# Patient Record
Sex: Female | Born: 1950 | Race: White | Hispanic: No | Marital: Married | Smoking: Former smoker
Health system: Southern US, Community
[De-identification: ages and names within clinical notes are randomized; demographics above are authoritative.]

## PROBLEM LIST (undated history)

## (undated) DIAGNOSIS — M810 Age-related osteoporosis without current pathological fracture: Secondary | ICD-10-CM

## (undated) DIAGNOSIS — R0600 Dyspnea, unspecified: Secondary | ICD-10-CM

## (undated) DIAGNOSIS — F32A Depression, unspecified: Secondary | ICD-10-CM

## (undated) DIAGNOSIS — R52 Pain, unspecified: Secondary | ICD-10-CM

## (undated) DIAGNOSIS — T4145XA Adverse effect of unspecified anesthetic, initial encounter: Secondary | ICD-10-CM

## (undated) DIAGNOSIS — R569 Unspecified convulsions: Secondary | ICD-10-CM

## (undated) DIAGNOSIS — G971 Other reaction to spinal and lumbar puncture: Secondary | ICD-10-CM

## (undated) DIAGNOSIS — J189 Pneumonia, unspecified organism: Secondary | ICD-10-CM

## (undated) DIAGNOSIS — M797 Fibromyalgia: Secondary | ICD-10-CM

## (undated) DIAGNOSIS — Z8489 Family history of other specified conditions: Secondary | ICD-10-CM

## (undated) DIAGNOSIS — T8859XA Other complications of anesthesia, initial encounter: Secondary | ICD-10-CM

## (undated) DIAGNOSIS — D649 Anemia, unspecified: Secondary | ICD-10-CM

## (undated) DIAGNOSIS — I2510 Atherosclerotic heart disease of native coronary artery without angina pectoris: Secondary | ICD-10-CM

## (undated) DIAGNOSIS — E039 Hypothyroidism, unspecified: Secondary | ICD-10-CM

## (undated) DIAGNOSIS — Z87442 Personal history of urinary calculi: Secondary | ICD-10-CM

## (undated) DIAGNOSIS — M1990 Unspecified osteoarthritis, unspecified site: Secondary | ICD-10-CM

## (undated) DIAGNOSIS — J45909 Unspecified asthma, uncomplicated: Secondary | ICD-10-CM

## (undated) DIAGNOSIS — F419 Anxiety disorder, unspecified: Secondary | ICD-10-CM

## (undated) DIAGNOSIS — J449 Chronic obstructive pulmonary disease, unspecified: Secondary | ICD-10-CM

## (undated) DIAGNOSIS — I639 Cerebral infarction, unspecified: Secondary | ICD-10-CM

## (undated) DIAGNOSIS — M3500 Sicca syndrome, unspecified: Secondary | ICD-10-CM

## (undated) DIAGNOSIS — I10 Essential (primary) hypertension: Secondary | ICD-10-CM

## (undated) DIAGNOSIS — I351 Nonrheumatic aortic (valve) insufficiency: Secondary | ICD-10-CM

## (undated) DIAGNOSIS — H269 Unspecified cataract: Secondary | ICD-10-CM

## (undated) DIAGNOSIS — F329 Major depressive disorder, single episode, unspecified: Secondary | ICD-10-CM

## (undated) DIAGNOSIS — Z5189 Encounter for other specified aftercare: Secondary | ICD-10-CM

## (undated) DIAGNOSIS — R112 Nausea with vomiting, unspecified: Secondary | ICD-10-CM

## (undated) DIAGNOSIS — I776 Arteritis, unspecified: Secondary | ICD-10-CM

## (undated) DIAGNOSIS — J42 Unspecified chronic bronchitis: Secondary | ICD-10-CM

## (undated) DIAGNOSIS — E559 Vitamin D deficiency, unspecified: Secondary | ICD-10-CM

## (undated) DIAGNOSIS — E538 Deficiency of other specified B group vitamins: Secondary | ICD-10-CM

## (undated) DIAGNOSIS — E785 Hyperlipidemia, unspecified: Secondary | ICD-10-CM

## (undated) DIAGNOSIS — M5030 Other cervical disc degeneration, unspecified cervical region: Secondary | ICD-10-CM

## (undated) DIAGNOSIS — Z9889 Other specified postprocedural states: Secondary | ICD-10-CM

## (undated) DIAGNOSIS — K219 Gastro-esophageal reflux disease without esophagitis: Secondary | ICD-10-CM

## (undated) HISTORY — PX: COLONOSCOPY: SHX174

## (undated) HISTORY — DX: Age-related osteoporosis without current pathological fracture: M81.0

## (undated) HISTORY — PX: STOMACH SURGERY: SHX791

## (undated) HISTORY — DX: Vitamin D deficiency, unspecified: E55.9

## (undated) HISTORY — DX: Anxiety disorder, unspecified: F41.9

## (undated) HISTORY — DX: Encounter for other specified aftercare: Z51.89

## (undated) HISTORY — PX: APPENDECTOMY: SHX54

## (undated) HISTORY — PX: OOPHORECTOMY: SHX86

## (undated) HISTORY — DX: Nonrheumatic aortic (valve) insufficiency: I35.1

## (undated) HISTORY — PX: ABDOMINAL HYSTERECTOMY: SHX81

## (undated) HISTORY — DX: Unspecified osteoarthritis, unspecified site: M19.90

## (undated) HISTORY — DX: Sjogren syndrome, unspecified: M35.00

## (undated) HISTORY — DX: Deficiency of other specified B group vitamins: E53.8

## (undated) HISTORY — PX: CYSTOSCOPY: SUR368

## (undated) HISTORY — DX: Cerebral infarction, unspecified: I63.9

## (undated) HISTORY — PX: ESOPHAGOGASTRODUODENOSCOPY: SHX1529

## (undated) HISTORY — DX: Unspecified cataract: H26.9

## (undated) HISTORY — PX: KNEE ARTHROSCOPY: SUR90

## (undated) HISTORY — PX: CHOLECYSTECTOMY: SHX55

---
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
## 1990-07-15 HISTORY — PX: BREAST LUMPECTOMY: SHX2

## 1990-07-15 HISTORY — PX: BREAST BIOPSY: SHX20

## 2004-05-03 ENCOUNTER — Ambulatory Visit: Payer: Self-pay | Admitting: Physician Assistant

## 2004-06-13 ENCOUNTER — Ambulatory Visit: Payer: Self-pay | Admitting: Physician Assistant

## 2004-07-10 ENCOUNTER — Ambulatory Visit: Payer: Self-pay | Admitting: Physician Assistant

## 2004-08-07 ENCOUNTER — Ambulatory Visit: Payer: Self-pay | Admitting: Physician Assistant

## 2004-09-06 ENCOUNTER — Ambulatory Visit: Payer: Self-pay | Admitting: Physician Assistant

## 2004-10-04 ENCOUNTER — Ambulatory Visit: Payer: Self-pay | Admitting: Physician Assistant

## 2004-11-06 ENCOUNTER — Ambulatory Visit: Payer: Self-pay | Admitting: Physician Assistant

## 2004-12-11 ENCOUNTER — Ambulatory Visit: Payer: Self-pay | Admitting: Physician Assistant

## 2004-12-26 ENCOUNTER — Ambulatory Visit: Payer: Self-pay | Admitting: Pain Medicine

## 2005-01-01 HISTORY — PX: BRAIN SURGERY: SHX531

## 2005-01-23 ENCOUNTER — Ambulatory Visit: Payer: Self-pay | Admitting: Physician Assistant

## 2005-02-25 ENCOUNTER — Ambulatory Visit: Payer: Self-pay | Admitting: Physician Assistant

## 2005-04-09 ENCOUNTER — Ambulatory Visit: Payer: Self-pay | Admitting: Physician Assistant

## 2005-05-09 ENCOUNTER — Ambulatory Visit: Payer: Self-pay | Admitting: Physician Assistant

## 2005-05-10 ENCOUNTER — Observation Stay: Payer: Self-pay | Admitting: Internal Medicine

## 2005-05-21 ENCOUNTER — Ambulatory Visit: Payer: Self-pay | Admitting: Oncology

## 2005-06-04 ENCOUNTER — Ambulatory Visit: Payer: Self-pay | Admitting: Physician Assistant

## 2005-06-14 ENCOUNTER — Ambulatory Visit: Payer: Self-pay | Admitting: Oncology

## 2005-07-02 ENCOUNTER — Ambulatory Visit: Payer: Self-pay | Admitting: Physician Assistant

## 2005-08-07 ENCOUNTER — Ambulatory Visit: Payer: Self-pay | Admitting: Physician Assistant

## 2005-09-03 ENCOUNTER — Ambulatory Visit: Payer: Self-pay | Admitting: Physician Assistant

## 2005-10-09 ENCOUNTER — Ambulatory Visit: Payer: Self-pay | Admitting: Physician Assistant

## 2005-11-01 ENCOUNTER — Ambulatory Visit: Payer: Self-pay | Admitting: Neurology

## 2005-11-04 ENCOUNTER — Ambulatory Visit: Payer: Self-pay | Admitting: Physician Assistant

## 2005-11-14 ENCOUNTER — Ambulatory Visit: Payer: Self-pay | Admitting: Internal Medicine

## 2005-12-12 ENCOUNTER — Ambulatory Visit: Payer: Self-pay | Admitting: Physician Assistant

## 2006-01-09 ENCOUNTER — Ambulatory Visit: Payer: Self-pay | Admitting: Physician Assistant

## 2006-01-23 ENCOUNTER — Ambulatory Visit: Payer: Self-pay | Admitting: Pain Medicine

## 2006-02-04 ENCOUNTER — Ambulatory Visit: Payer: Self-pay | Admitting: Pain Medicine

## 2006-02-07 ENCOUNTER — Ambulatory Visit: Payer: Self-pay | Admitting: Pain Medicine

## 2006-02-10 ENCOUNTER — Ambulatory Visit: Payer: Self-pay | Admitting: Pain Medicine

## 2006-02-25 ENCOUNTER — Ambulatory Visit: Payer: Self-pay | Admitting: Pain Medicine

## 2006-04-01 ENCOUNTER — Ambulatory Visit: Payer: Self-pay | Admitting: Physician Assistant

## 2006-04-23 ENCOUNTER — Encounter: Payer: Self-pay | Admitting: Pain Medicine

## 2006-05-08 ENCOUNTER — Ambulatory Visit: Payer: Self-pay | Admitting: Physician Assistant

## 2006-05-15 ENCOUNTER — Encounter: Payer: Self-pay | Admitting: Pain Medicine

## 2006-06-04 ENCOUNTER — Ambulatory Visit: Payer: Self-pay | Admitting: Physician Assistant

## 2006-06-30 ENCOUNTER — Ambulatory Visit: Payer: Self-pay | Admitting: Physician Assistant

## 2007-02-04 ENCOUNTER — Emergency Department: Payer: Self-pay | Admitting: Emergency Medicine

## 2007-06-23 ENCOUNTER — Ambulatory Visit: Payer: Self-pay | Admitting: Internal Medicine

## 2007-10-28 ENCOUNTER — Ambulatory Visit: Payer: Self-pay | Admitting: Internal Medicine

## 2007-11-26 ENCOUNTER — Ambulatory Visit: Payer: Self-pay | Admitting: Neurology

## 2008-11-01 ENCOUNTER — Ambulatory Visit: Payer: Self-pay | Admitting: Internal Medicine

## 2008-11-11 ENCOUNTER — Ambulatory Visit: Payer: Self-pay | Admitting: Internal Medicine

## 2009-12-01 ENCOUNTER — Emergency Department: Payer: Self-pay

## 2009-12-28 ENCOUNTER — Ambulatory Visit: Payer: Self-pay | Admitting: Internal Medicine

## 2010-11-28 ENCOUNTER — Ambulatory Visit: Payer: Self-pay | Admitting: Internal Medicine

## 2011-09-12 ENCOUNTER — Ambulatory Visit: Payer: Self-pay | Admitting: Physician Assistant

## 2011-12-16 ENCOUNTER — Emergency Department (HOSPITAL_COMMUNITY): Payer: Medicare Other

## 2011-12-16 ENCOUNTER — Encounter (HOSPITAL_COMMUNITY): Payer: Self-pay | Admitting: Emergency Medicine

## 2011-12-16 ENCOUNTER — Emergency Department (HOSPITAL_COMMUNITY)
Admission: EM | Admit: 2011-12-16 | Discharge: 2011-12-16 | Disposition: A | Discharge status: Home / Self Care | Payer: Medicare Other | Attending: Emergency Medicine | Admitting: Emergency Medicine

## 2011-12-16 DIAGNOSIS — R110 Nausea: Secondary | ICD-10-CM | POA: Insufficient documentation

## 2011-12-16 DIAGNOSIS — R197 Diarrhea, unspecified: Secondary | ICD-10-CM

## 2011-12-16 DIAGNOSIS — R109 Unspecified abdominal pain: Secondary | ICD-10-CM | POA: Insufficient documentation

## 2011-12-16 DIAGNOSIS — I10 Essential (primary) hypertension: Secondary | ICD-10-CM | POA: Insufficient documentation

## 2011-12-16 DIAGNOSIS — K219 Gastro-esophageal reflux disease without esophagitis: Secondary | ICD-10-CM | POA: Insufficient documentation

## 2011-12-16 HISTORY — DX: Fibromyalgia: M79.7

## 2011-12-16 HISTORY — DX: Essential (primary) hypertension: I10

## 2011-12-16 HISTORY — DX: Unspecified osteoarthritis, unspecified site: M19.90

## 2011-12-16 LAB — POCT I-STAT, CHEM 8
Calcium, Ion: 1.23 mmol/L (ref 1.12–1.32)
Creatinine, Ser: 1.2 mg/dL — ABNORMAL HIGH (ref 0.50–1.10)
Hemoglobin: 15.6 g/dL — ABNORMAL HIGH (ref 12.0–15.0)
Sodium: 144 mEq/L (ref 135–145)
TCO2: 21 mmol/L (ref 0–100)

## 2011-12-16 LAB — DIFFERENTIAL
Basophils Absolute: 0.1 10*3/uL (ref 0.0–0.1)
Eosinophils Absolute: 0.8 10*3/uL — ABNORMAL HIGH (ref 0.0–0.7)
Eosinophils Relative: 9 % — ABNORMAL HIGH (ref 0–5)

## 2011-12-16 LAB — CBC
MCH: 30.1 pg (ref 26.0–34.0)
MCV: 89.3 fL (ref 78.0–100.0)
Platelets: 280 10*3/uL (ref 150–400)
RDW: 13.6 % (ref 11.5–15.5)

## 2011-12-16 MED ORDER — SODIUM CHLORIDE 0.9 % IV BOLUS (SEPSIS): 500.0000 mL | Freq: Once | INTRAVENOUS | Status: DC

## 2011-12-16 MED ORDER — SODIUM CHLORIDE 0.9 % IV SOLN
Freq: Once | INTRAVENOUS | Status: AC
  Administered 2011-12-16: 19:00:00 via INTRAVENOUS

## 2011-12-16 MED ORDER — DIPHENOXYLATE-ATROPINE 2.5-0.025 MG PO TABS: 1.0000 | ORAL_TABLET | Freq: Four times a day (QID) | ORAL | Status: AC | PRN

## 2011-12-16 NOTE — ED Notes (Signed)
Pt is currently in radiology. 

## 2011-12-16 NOTE — ED Notes (Signed)
Pt tolerating Orange PO contrast at this time.   Pt not having any airway issues, pt maintaining saliva, pt with respirations even and unlabored.  Pt ambulated to and from RR with steady gait.

## 2011-12-16 NOTE — ED Notes (Signed)
Pt states she hasn't taken anything to eat today. Pt states she only drunk water.

## 2011-12-16 NOTE — ED Notes (Signed)
Pt states pain is at a 8 in her abdomen

## 2011-12-16 NOTE — Discharge Instructions (Signed)
Please follow up with your GI doctor on June 20th as scheduled.    Abdominal Pain Abdominal pain can be caused by many things. Your caregiver decides the seriousness of your pain by an examination and possibly blood tests and X-rays. Many cases can be observed and treated at home. Most abdominal pain is not caused by a disease and will probably improve without treatment. However, in many cases, more time must pass before a clear cause of the pain can be found. Before that point, it may not be known if you need more testing, or if hospitalization or surgery is needed. HOME CARE INSTRUCTIONS   Do not take laxatives unless directed by your caregiver.   Take pain medicine only as directed by your caregiver.   Only take over-the-counter or prescription medicines for pain, discomfort, or fever as directed by your caregiver.   Try a clear liquid diet (broth, tea, or water) for as long as directed by your caregiver. Slowly move to a bland diet as tolerated.  SEEK IMMEDIATE MEDICAL CARE IF:   The pain does not go away.   You have a fever.   You keep throwing up (vomiting).   The pain is felt only in portions of the abdomen. Pain in the right side could possibly be appendicitis. In an adult, pain in the left lower portion of the abdomen could be colitis or diverticulitis.   You pass bloody or black tarry stools.  MAKE SURE YOU:   Understand these instructions.   Will watch your condition.   Will get help right away if you are not doing well or get worse.  Document Released: 04/10/2005 Document Revised: 06/20/2011 Document Reviewed: 02/17/2008 [facility] Patient Information 2012 [place], [place].Diarrhea Infections caused by germs (bacterial) or a virus commonly cause diarrhea. Your caregiver has determined that with time, rest and fluids, the diarrhea should improve. In general, eat normally while drinking more water than usual. Although water may prevent dehydration, it does not contain salt and  minerals (electrolytes). Broths, weak tea without caffeine and oral rehydration solutions (ORS) replace fluids and electrolytes. Small amounts of fluids should be taken frequently. Large amounts at one time may not be tolerated. Plain water may be harmful in infants and the elderly. Oral rehydrating solutions (ORS) are available at pharmacies and grocery stores. ORS replace water and important electrolytes in proper proportions. Sports drinks are not as effective as ORS and may be harmful due to sugars worsening diarrhea.  ORS is especially recommended for use in children with diarrhea. As a general guideline for children, replace any new fluid losses from diarrhea and/or vomiting with ORS as follows:   If your child weighs 22 pounds or under (10 kg or less), give 60-120 mL ( -  cup or 2 - 4 ounces) of ORS for each episode of diarrheal stool or vomiting episode.   If your child weighs more than 22 pounds (more than 10 kgs), give 120-240 mL ( - 1 cup or 4 - 8 ounces) of ORS for each diarrheal stool or episode of vomiting.   While correcting for dehydration, children should eat normally. However, foods high in sugar should be avoided because this may worsen diarrhea. Large amounts of carbonated soft drinks, juice, gelatin desserts and other highly sugared drinks should be avoided.   After correction of dehydration, other liquids that are appealing to the child may be added. Children should drink small amounts of fluids frequently and fluids should be increased as tolerated. Children should drink  enough fluids to keep urine clear or pale yellow.   Adults should eat normally while drinking more fluids than usual. Drink small amounts of fluids frequently and increase as tolerated. Drink enough fluids to keep urine clear or pale yellow. Broths, weak decaffeinated tea, lemon lime soft drinks (allowed to go flat) and ORS replace fluids and electrolytes.   Avoid:   Carbonated drinks.   Juice.    Extremely hot or cold fluids.   Caffeine drinks.   Fatty, greasy foods.   Alcohol.   Tobacco.   Too much intake of anything at one time.   Gelatin desserts.   Probiotics are active cultures of beneficial bacteria. They may lessen the amount and number of diarrheal stools in adults. Probiotics can be found in yogurt with active cultures and in supplements.   Wash hands well to avoid spreading bacteria and virus.   Anti-diarrheal medications are not recommended for infants and children.   Only take over-the-counter or prescription medicines for pain, discomfort or fever as directed by your caregiver. Do not give aspirin to children because it may cause Reye's Syndrome.   For adults, ask your caregiver if you should continue all prescribed and over-the-counter medicines.   If your caregiver has given you a follow-up appointment, it is very important to keep that appointment. Not keeping the appointment could result in a chronic or permanent injury, and disability. If there is any problem keeping the appointment, you must call back to this facility for assistance.  SEEK IMMEDIATE MEDICAL CARE IF:   You or your child is unable to keep fluids down or other symptoms or problems become worse in spite of treatment.   Vomiting or diarrhea develops and becomes persistent.   There is vomiting of blood or bile (green material).   There is blood in the stool or the stools are black and tarry.   There is no urine output in 6-8 hours or there is only a small amount of very dark urine.   Abdominal pain develops, increases or localizes.   You have a fever.   Your baby is older than 3 months with a rectal temperature of 102 F (38.9 C) or higher.   Your baby is 71 months old or younger with a rectal temperature of 100.4 F (38 C) or higher.   You or your child develops excessive weakness, dizziness, fainting or extreme thirst.   You or your child develops a rash, stiff neck, severe  headache or become irritable or sleepy and difficult to awaken.  MAKE SURE YOU:   Understand these instructions.   Will watch your condition.   Will get help right away if you are not doing well or get worse.  Document Released: 06/21/2002 Document Revised: 06/20/2011 Document Reviewed: 05/08/2009 [facility] Patient Information 2012 [place], [place].

## 2011-12-16 NOTE — ED Provider Notes (Addendum)
History     CSN: [id number]  Arrival date & time 12/16/11  1411   First MD Initiated Contact with Patient 12/16/11 1735      Chief Complaint  Patient presents with  . Diarrhea    (Consider location/radiation/quality/duration/timing/severity/associated sxs/prior treatment) HPI Comments: Patient presents with a known history of loose stools that are changed since Friday.  Patient notes that since Friday she said more frequent loose stools approximately 3 times per day.  She's noted that they are coffee-ground looking stools.  No bright red blood.  Patient does note some increased abdominal pain that can be relieved by having a bowel movement.  She's had some nausea but no vomiting.  Patient did have a past history of a gastric ulcer which she was treated for.  Patient did note a fever on Friday night but no fever since that time.  She has had abdominal surgeries to include a cholecystectomy, an appendectomy and a total hysterectomy.  Patient is also on iron supplementation for a past history of anemia.  Patient is a 61 y.o. female presenting with diarrhea. The history is provided by the patient. No language interpreter was used.  Diarrhea The primary symptoms include abdominal pain, nausea and diarrhea. Primary symptoms do not include fever, vomiting, dysuria or rash.  The illness does not include chills or back pain.    Past Medical History  Diagnosis Date  . Hypertension   . Acid reflux   . Arthritis   . Fibromyalgia     Past Surgical History  Procedure Date  . Appendectomy   . Abdominal hysterectomy   . Cholecystectomy   . Brain surgery     History reviewed. No pertinent family history.  History  Substance Use Topics  . Smoking status: Never Smoker   . Smokeless tobacco: Not on file  . Alcohol Use: No    OB History    Grav Para Term Preterm Abortions TAB SAB Ect Mult Living                  Review of Systems  Constitutional: Negative.  Negative for fever and  chills.  HENT: Negative.   Eyes: Negative.  Negative for discharge and redness.  Respiratory: Negative.  Negative for cough and shortness of breath.   Cardiovascular: Negative.  Negative for chest pain.  Gastrointestinal: Positive for nausea, abdominal pain and diarrhea. Negative for vomiting.  Genitourinary: Negative.  Negative for dysuria and vaginal discharge.  Musculoskeletal: Negative.  Negative for back pain.  Skin: Negative.  Negative for color change and rash.  Neurological: Negative.  Negative for syncope and headaches.  Hematological: Negative.  Negative for adenopathy.  Psychiatric/Behavioral: Negative.  Negative for confusion.  All other systems reviewed and are negative.    Allergies  Other; Imitrex; Nsaids; and Tegretol  Home Medications   Current Outpatient Rx  Name Route Sig Dispense Refill  . ACAI PO Oral Take 1,000 mg by mouth daily.    . ASPIRIN EC 81 MG PO TBEC Oral Take 81 mg by mouth daily.    Marland Kitchen BIOTIN 5000 MCG PO CAPS Oral Take 1 capsule by mouth daily.    Marland Kitchen CITALOPRAM HYDROBROMIDE 40 MG PO TABS Oral Take 40 mg by mouth every evening.    Marland Kitchen VITAMIN B-12 PO Oral Take 2,500 mg by mouth daily.    Marland Kitchen BENADRYL PO Oral Take 50 mg by mouth as needed. For itching    . LOMOTIL PO Oral Take 1 tablet by mouth as needed.  For diarrhea    . FLAX SEED OIL 1000 MG PO CAPS Oral Take 1 capsule by mouth daily.    Marland Kitchen FOLIC ACID 1 MG PO TABS Oral Take 1 mg by mouth every morning.    Marland Kitchen HYDROCODONE-ACETAMINOPHEN 10-325 MG PO TABS Oral Take 1 tablet by mouth daily as needed. For pain    . IBUPROFEN 800 MG PO TABS Oral Take 800 mg by mouth 2 (two) times daily.    . IRON PO Oral Take 325 mg by mouth every morning.    . ACIDOPHILUS PO Oral Take 4 mg by mouth every morning.    Marland Kitchen LANSOPRAZOLE 30 MG PO CPDR Oral Take 30 mg by mouth 2 (two) times daily.    Marland Kitchen LISINOPRIL 40 MG PO TABS Oral Take 40 mg by mouth every evening.    Marland Kitchen LORAZEPAM 2 MG PO TABS Oral Take 2 mg by mouth at bedtime.      Marland Kitchen OVER THE COUNTER MEDICATION Oral Take 120 mg by mouth every morning. Gingko Biloba    . OVER THE COUNTER MEDICATION Oral Take 400 mg by mouth every morning. Ubiquinol Co Q 10    . OVER THE COUNTER MEDICATION Oral Take 40,000 Units by mouth every evening. serrapeptase    . VITAMIN B-2 PO Oral Take 1 tablet by mouth daily.    Marland Kitchen SIMVASTATIN 40 MG PO TABS Oral Take 40 mg by mouth every evening.    . TOPIRAMATE 200 MG PO TABS Oral Take 200 mg by mouth 2 (two) times daily.    Marland Kitchen VITAMIN D (ERGOCALCIFEROL) 50000 UNITS PO CAPS Oral Take 50,000 Units by mouth every 7 (seven) days. saturdays      BP 130/77  Pulse 69  Temp(Src) 98 F (36.7 C) (Oral)  Resp 16  SpO2 99%  Physical Exam  Nursing note and vitals reviewed. Constitutional: She is oriented to person, place, and time. She appears well-developed and well-nourished.  Non-toxic appearance. She does not have a sickly appearance.       Obese female sitting on the bed  HENT:  Head: Normocephalic and atraumatic.  Eyes: Conjunctivae, EOM and lids are normal. Pupils are equal, round, and reactive to light. No scleral icterus.  Neck: Trachea normal and normal range of motion. Neck supple.  Cardiovascular: Normal rate, regular rhythm and normal heart sounds.   Pulmonary/Chest: Effort normal and breath sounds normal.  Abdominal: Soft. Normal appearance. There is tenderness. There is no rebound, no guarding and no CVA tenderness.       No rebound or guarding but there is lower abdominal tenderness worse in the left lower quadrant.  Genitourinary:       No grossly bloody or melanic stool present.  Hemoccult negative.  Musculoskeletal: Normal range of motion.  Neurological: She is alert and oriented to person, place, and time. She has normal strength.  Skin: Skin is warm, dry and intact. No rash noted.  Psychiatric: She has a normal mood and affect. Her behavior is normal. Judgment and thought content normal.    ED Course  Procedures (including  critical care time)  Results for orders placed during the hospital encounter of 12/16/11  CBC      Component Value Range   WBC 8.5  4.0 - 10.5 (K/uL)   RBC 4.75  3.87 - 5.11 (MIL/uL)   Hemoglobin 14.3  12.0 - 15.0 (g/dL)   HCT 98.1  19.1 - 47.8 (%)   MCV 89.3  78.0 - 100.0 (fL)  MCH 30.1  26.0 - 34.0 (pg)   MCHC 33.7  30.0 - 36.0 (g/dL)   RDW 16.1  09.6 - 04.5 (%)   Platelets 280  150 - 400 (K/uL)  DIFFERENTIAL      Component Value Range   Neutrophils Relative 64  43 - 77 (%)   Neutro Abs 5.4  1.7 - 7.7 (K/uL)   Lymphocytes Relative 21  12 - 46 (%)   Lymphs Abs 1.8  0.7 - 4.0 (K/uL)   Monocytes Relative 6  3 - 12 (%)   Monocytes Absolute 0.5  0.1 - 1.0 (K/uL)   Eosinophils Relative 9 (*) 0 - 5 (%)   Eosinophils Absolute 0.8 (*) 0.0 - 0.7 (K/uL)   Basophils Relative 1  0 - 1 (%)   Basophils Absolute 0.1  0.0 - 0.1 (K/uL)  POCT I-STAT, CHEM 8      Component Value Range   Sodium 144  135 - 145 (mEq/L)   Potassium 3.6  3.5 - 5.1 (mEq/L)   Chloride 112  96 - 112 (mEq/L)   BUN 16  6 - 23 (mg/dL)   Creatinine, Ser 4.09 (*) 0.50 - 1.10 (mg/dL)   Glucose, Bld 92  70 - 99 (mg/dL)   Calcium, Ion 8.11  9.14 - 1.32 (mmol/L)   TCO2 21  0 - 100 (mmol/L)   Hemoglobin 15.6 (*) 12.0 - 15.0 (g/dL)   HCT 78.2  95.6 - 21.3 (%)        MDM  Patient with somewhat diffuse abdominal pain but appears to be worse in the left lower quadrant.  Patient describes some stools that may be concerning for bleeding but her Hemoccult negative here.  There is no bright red blood present.  Given patient's pain and history of fevers on Friday night to do have some concern for possible diverticulitis or other colitis process.  Patient will have a CT scan to assess for these processes.    Nat Christen, MD 12/16/11 1834  Patient's CT does not show any acute pathology such as colitis or diverticulitis at this time.  Patient's hemoglobin is stable.  She had no signs of GI bleeding here in the emergency  department.  Patient's vital signs have remained normal and she is afebrile.  Patient already has scheduled GI followup on June 20.  Believe patient is safe for discharge home as her diarrhea has been ongoing for many months to years and is not new and acute in the overall picture.  Nat Christen, MD 12/16/11 [phone]

## 2011-12-16 NOTE — ED Notes (Signed)
Pt states she is allergic to iodine/ consulted with Dr, Golda Acre who changed order to ct of abdomen w/o contrast

## 2011-12-16 NOTE — ED Notes (Signed)
Pt is currently back in her room 

## 2011-12-16 NOTE — ED Notes (Signed)
Pt states that the berry flavored PO contrast was making her lips tingle/ called ct and they offered her orange cream or apple flavored and patient chosed the orange cream

## 2011-12-16 NOTE — ED Notes (Signed)
Pt refusing to continue taking orange flavored contrast stating that it is causing her to have more diarrhea. Consulted with Dr. Golda Acre verbally agree that she did not have to continue the contrast. Called CT to let them know they can come take her to radiology.

## 2011-12-16 NOTE — ED Notes (Signed)
MD at bedside. 

## 2011-12-16 NOTE — ED Notes (Signed)
Onset 3 days ago diarrhea 3 episodes a day. States stool looks like coffee grounds soft stool.  States history if diarrhea however past few days ago worsening. General abdominal pain currently 8/10 achy sharp.

## 2011-12-18 LAB — OVA AND PARASITE EXAMINATION: Ova and parasites: NONE SEEN

## 2011-12-20 LAB — STOOL CULTURE

## 2012-02-05 ENCOUNTER — Ambulatory Visit: Payer: Self-pay | Admitting: Gastroenterology

## 2012-02-07 LAB — PATHOLOGY REPORT

## 2012-03-27 ENCOUNTER — Ambulatory Visit: Payer: Self-pay | Admitting: Neurology

## 2012-11-10 ENCOUNTER — Ambulatory Visit: Payer: Self-pay | Admitting: Internal Medicine

## 2012-12-01 ENCOUNTER — Emergency Department: Payer: Self-pay | Admitting: Emergency Medicine

## 2012-12-01 LAB — CBC
HCT: 40.4 % (ref 35.0–47.0)
HGB: 13.4 g/dL (ref 12.0–16.0)
MCH: 28.3 pg (ref 26.0–34.0)
MCHC: 33 g/dL (ref 32.0–36.0)
Platelet: 379 10*3/uL (ref 150–440)
RBC: 4.73 10*6/uL (ref 3.80–5.20)
WBC: 11.4 10*3/uL — ABNORMAL HIGH (ref 3.6–11.0)

## 2012-12-01 LAB — COMPREHENSIVE METABOLIC PANEL
Bilirubin,Total: 0.3 mg/dL (ref 0.2–1.0)
Chloride: 114 mmol/L — ABNORMAL HIGH (ref 98–107)
EGFR (African American): 54 — ABNORMAL LOW
EGFR (Non-African Amer.): 47 — ABNORMAL LOW
SGPT (ALT): 16 U/L (ref 12–78)
Sodium: 145 mmol/L (ref 136–145)
Total Protein: 8.2 g/dL (ref 6.4–8.2)

## 2012-12-01 LAB — URINALYSIS, COMPLETE
Bilirubin,UR: NEGATIVE
Blood: NEGATIVE
Hyaline Cast: 3
Protein: 30
RBC,UR: 2 /HPF (ref 0–5)
Squamous Epithelial: 4
WBC UR: 3 /HPF (ref 0–5)

## 2012-12-18 ENCOUNTER — Ambulatory Visit: Payer: Self-pay | Admitting: Internal Medicine

## 2012-12-28 ENCOUNTER — Ambulatory Visit: Payer: Self-pay | Admitting: Gastroenterology

## 2013-11-25 ENCOUNTER — Ambulatory Visit: Payer: Self-pay | Admitting: Internal Medicine

## 2014-03-23 ENCOUNTER — Ambulatory Visit: Payer: Self-pay | Admitting: Internal Medicine

## 2014-06-15 ENCOUNTER — Emergency Department: Payer: Self-pay | Admitting: Emergency Medicine

## 2014-07-20 ENCOUNTER — Ambulatory Visit: Payer: Self-pay | Admitting: Oncology

## 2014-07-20 LAB — CBC CANCER CENTER
BASOS ABS: 0.1 x10 3/mm (ref 0.0–0.1)
Basophil %: 1 %
EOS ABS: 0.4 x10 3/mm (ref 0.0–0.7)
Eosinophil %: 6.6 %
HCT: 39.1 % (ref 35.0–47.0)
HGB: 12.9 g/dL (ref 12.0–16.0)
LYMPHS ABS: 1.2 x10 3/mm (ref 1.0–3.6)
Lymphocyte %: 18.3 %
MCH: 30.4 pg (ref 26.0–34.0)
MCHC: 33 g/dL (ref 32.0–36.0)
MCV: 92 fL (ref 80–100)
MONO ABS: 0.5 x10 3/mm (ref 0.2–0.9)
MONOS PCT: 8.1 %
NEUTROS ABS: 4.2 x10 3/mm (ref 1.4–6.5)
Neutrophil %: 66 %
Platelet: 223 x10 3/mm (ref 150–440)
RBC: 4.25 10*6/uL (ref 3.80–5.20)
RDW: 13 % (ref 11.5–14.5)
WBC: 6.3 x10 3/mm (ref 3.6–11.0)

## 2014-07-20 LAB — COMPREHENSIVE METABOLIC PANEL
ALBUMIN: 3.5 g/dL (ref 3.4–5.0)
ALK PHOS: 95 U/L
ALT: 18 U/L
Anion Gap: 11 (ref 7–16)
BILIRUBIN TOTAL: 0.3 mg/dL (ref 0.2–1.0)
BUN: 11 mg/dL (ref 7–18)
Calcium, Total: 8.2 mg/dL — ABNORMAL LOW (ref 8.5–10.1)
Chloride: 107 mmol/L (ref 98–107)
Co2: 25 mmol/L (ref 21–32)
Creatinine: 1.05 mg/dL (ref 0.60–1.30)
EGFR (African American): >60
GFR CALC NON AF AMER: 56 — AB
GLUCOSE: 91 mg/dL (ref 65–99)
Osmolality: 284 (ref 275–301)
Potassium: 3.1 mmol/L — ABNORMAL LOW (ref 3.5–5.1)
SGOT(AST): 16 U/L (ref 15–37)
SODIUM: 143 mmol/L (ref 136–145)
TOTAL PROTEIN: 7 g/dL (ref 6.4–8.2)

## 2014-07-21 LAB — KAPPA/LAMBDA FREE LIGHT CHAINS (ARMC)

## 2014-07-21 LAB — PROT IMMUNOELECTROPHORES(ARMC)

## 2014-07-28 ENCOUNTER — Emergency Department: Payer: Self-pay | Admitting: Emergency Medicine

## 2014-07-28 LAB — COMPREHENSIVE METABOLIC PANEL
ALT: 22 U/L
AST: 24 U/L (ref 15–37)
Albumin: 3.5 g/dL (ref 3.4–5.0)
Alkaline Phosphatase: 102 U/L
Anion Gap: 8 (ref 7–16)
BUN: 13 mg/dL (ref 7–18)
Bilirubin,Total: 0.3 mg/dL (ref 0.2–1.0)
Calcium, Total: 8.6 mg/dL (ref 8.5–10.1)
Chloride: 108 mmol/L — ABNORMAL HIGH (ref 98–107)
Co2: 26 mmol/L (ref 21–32)
Creatinine: 1.04 mg/dL (ref 0.60–1.30)
EGFR (African American): >60
EGFR (Non-African Amer.): 57 — ABNORMAL LOW
GLUCOSE: 90 mg/dL (ref 65–99)
Osmolality: 283 (ref 275–301)
Potassium: 3.4 mmol/L — ABNORMAL LOW (ref 3.5–5.1)
Sodium: 142 mmol/L (ref 136–145)
Total Protein: 7.3 g/dL (ref 6.4–8.2)

## 2014-07-28 LAB — CBC
HCT: 40.3 % (ref 35.0–47.0)
HGB: 13 g/dL (ref 12.0–16.0)
MCH: 30.4 pg (ref 26.0–34.0)
MCHC: 32.2 g/dL (ref 32.0–36.0)
MCV: 94 fL (ref 80–100)
PLATELETS: 232 10*3/uL (ref 150–440)
RBC: 4.27 10*6/uL (ref 3.80–5.20)
RDW: 13.2 % (ref 11.5–14.5)
WBC: 7.6 10*3/uL (ref 3.6–11.0)

## 2014-07-28 LAB — TROPONIN I: Troponin-I: 0.03 ng/mL

## 2014-08-15 ENCOUNTER — Ambulatory Visit: Payer: Self-pay | Admitting: Oncology

## 2014-09-01 ENCOUNTER — Emergency Department: Payer: Self-pay | Admitting: Emergency Medicine

## 2014-09-13 ENCOUNTER — Ambulatory Visit: Payer: Self-pay | Admitting: Internal Medicine

## 2014-11-22 ENCOUNTER — Other Ambulatory Visit: Payer: Self-pay | Admitting: Internal Medicine

## 2014-11-22 DIAGNOSIS — Z1231 Encounter for screening mammogram for malignant neoplasm of breast: Secondary | ICD-10-CM

## 2014-12-08 ENCOUNTER — Other Ambulatory Visit: Payer: Self-pay | Admitting: Internal Medicine

## 2014-12-08 DIAGNOSIS — D329 Benign neoplasm of meninges, unspecified: Secondary | ICD-10-CM

## 2014-12-14 ENCOUNTER — Other Ambulatory Visit: Payer: Self-pay | Admitting: Internal Medicine

## 2014-12-14 ENCOUNTER — Ambulatory Visit
Admission: RE | Admit: 2014-12-14 | Discharge: 2014-12-14 | Disposition: A | Discharge status: Home / Self Care | Payer: Medicare Other | Source: Ambulatory Visit | Attending: Internal Medicine | Admitting: Internal Medicine

## 2014-12-14 DIAGNOSIS — Z1231 Encounter for screening mammogram for malignant neoplasm of breast: Secondary | ICD-10-CM | POA: Insufficient documentation

## 2014-12-16 ENCOUNTER — Ambulatory Visit: Admission: RE | Admit: 2014-12-16 | Payer: Medicare Other | Source: Ambulatory Visit

## 2014-12-16 ENCOUNTER — Ambulatory Visit
Admission: RE | Admit: 2014-12-16 | Discharge: 2014-12-16 | Disposition: A | Discharge status: Home / Self Care | Payer: Medicare Other | Source: Ambulatory Visit | Attending: Internal Medicine | Admitting: Internal Medicine

## 2014-12-16 DIAGNOSIS — Z08 Encounter for follow-up examination after completed treatment for malignant neoplasm: Secondary | ICD-10-CM | POA: Insufficient documentation

## 2014-12-16 DIAGNOSIS — D329 Benign neoplasm of meninges, unspecified: Secondary | ICD-10-CM | POA: Diagnosis present

## 2014-12-16 DIAGNOSIS — I679 Cerebrovascular disease, unspecified: Secondary | ICD-10-CM | POA: Insufficient documentation

## 2014-12-16 DIAGNOSIS — G930 Cerebral cysts: Secondary | ICD-10-CM | POA: Insufficient documentation

## 2014-12-16 DIAGNOSIS — Z9889 Other specified postprocedural states: Secondary | ICD-10-CM | POA: Diagnosis present

## 2014-12-28 ENCOUNTER — Other Ambulatory Visit: Payer: Self-pay | Admitting: Unknown Physician Specialty

## 2014-12-28 ENCOUNTER — Ambulatory Visit: Payer: Medicare Other | Admitting: Pain Medicine

## 2014-12-28 DIAGNOSIS — M170 Bilateral primary osteoarthritis of knee: Secondary | ICD-10-CM

## 2015-01-05 ENCOUNTER — Ambulatory Visit
Admission: RE | Admit: 2015-01-05 | Discharge: 2015-01-05 | Disposition: A | Discharge status: Home / Self Care | Payer: Medicare Other | Source: Ambulatory Visit | Attending: Unknown Physician Specialty | Admitting: Unknown Physician Specialty

## 2015-01-05 ENCOUNTER — Ambulatory Visit: Admission: RE | Admit: 2015-01-05 | Payer: Medicare Other | Source: Ambulatory Visit

## 2015-01-05 DIAGNOSIS — M2342 Loose body in knee, left knee: Secondary | ICD-10-CM | POA: Insufficient documentation

## 2015-01-05 DIAGNOSIS — M25562 Pain in left knee: Secondary | ICD-10-CM | POA: Diagnosis present

## 2015-01-05 DIAGNOSIS — S83242A Other tear of medial meniscus, current injury, left knee, initial encounter: Secondary | ICD-10-CM | POA: Diagnosis not present

## 2015-01-05 DIAGNOSIS — S83281A Other tear of lateral meniscus, current injury, right knee, initial encounter: Secondary | ICD-10-CM | POA: Insufficient documentation

## 2015-01-05 DIAGNOSIS — M1712 Unilateral primary osteoarthritis, left knee: Secondary | ICD-10-CM | POA: Insufficient documentation

## 2015-01-05 DIAGNOSIS — M170 Bilateral primary osteoarthritis of knee: Secondary | ICD-10-CM

## 2015-01-05 DIAGNOSIS — M179 Osteoarthritis of knee, unspecified: Secondary | ICD-10-CM | POA: Diagnosis present

## 2015-01-05 DIAGNOSIS — M25561 Pain in right knee: Secondary | ICD-10-CM | POA: Diagnosis present

## 2015-01-05 DIAGNOSIS — M1711 Unilateral primary osteoarthritis, right knee: Secondary | ICD-10-CM | POA: Diagnosis not present

## 2015-01-15 ENCOUNTER — Other Ambulatory Visit: Payer: Self-pay | Admitting: Pain Medicine

## 2015-01-17 ENCOUNTER — Other Ambulatory Visit: Payer: Self-pay | Admitting: Pain Medicine

## 2015-01-17 ENCOUNTER — Ambulatory Visit: Payer: Medicare Other | Attending: Pain Medicine | Admitting: Pain Medicine

## 2015-01-17 ENCOUNTER — Encounter: Payer: Self-pay | Admitting: Pain Medicine

## 2015-01-17 VITALS — BP 131/74 | HR 72 | Temp 97.5°F | Resp 18 | Ht 63.0 in | Wt 233.0 lb

## 2015-01-17 DIAGNOSIS — M4806 Spinal stenosis, lumbar region: Secondary | ICD-10-CM | POA: Insufficient documentation

## 2015-01-17 DIAGNOSIS — M533 Sacrococcygeal disorders, not elsewhere classified: Secondary | ICD-10-CM | POA: Diagnosis not present

## 2015-01-17 DIAGNOSIS — M4186 Other forms of scoliosis, lumbar region: Secondary | ICD-10-CM | POA: Insufficient documentation

## 2015-01-17 DIAGNOSIS — M51369 Other intervertebral disc degeneration, lumbar region without mention of lumbar back pain or lower extremity pain: Secondary | ICD-10-CM

## 2015-01-17 DIAGNOSIS — G43909 Migraine, unspecified, not intractable, without status migrainosus: Secondary | ICD-10-CM | POA: Insufficient documentation

## 2015-01-17 DIAGNOSIS — M545 Low back pain: Secondary | ICD-10-CM | POA: Diagnosis present

## 2015-01-17 DIAGNOSIS — M5134 Other intervertebral disc degeneration, thoracic region: Secondary | ICD-10-CM

## 2015-01-17 DIAGNOSIS — M5136 Other intervertebral disc degeneration, lumbar region: Secondary | ICD-10-CM | POA: Diagnosis not present

## 2015-01-17 DIAGNOSIS — G43109 Migraine with aura, not intractable, without status migrainosus: Secondary | ICD-10-CM

## 2015-01-17 DIAGNOSIS — M546 Pain in thoracic spine: Secondary | ICD-10-CM | POA: Diagnosis present

## 2015-01-17 DIAGNOSIS — M47816 Spondylosis without myelopathy or radiculopathy, lumbar region: Secondary | ICD-10-CM

## 2015-01-17 DIAGNOSIS — M47814 Spondylosis without myelopathy or radiculopathy, thoracic region: Secondary | ICD-10-CM | POA: Insufficient documentation

## 2015-01-17 DIAGNOSIS — M47894 Other spondylosis, thoracic region: Secondary | ICD-10-CM

## 2015-01-17 DIAGNOSIS — M5481 Occipital neuralgia: Secondary | ICD-10-CM | POA: Insufficient documentation
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
## 2015-01-17 NOTE — Progress Notes (Signed)
Safety precautions to be maintained throughout the outpatient stay will include: orient to surroundings, keep bed in low position, maintain call bell within reach at all times, provide assistance with transfer out of bed and ambulation.  

## 2015-01-17 NOTE — Patient Instructions (Addendum)
Continue present medications. We will  need permission from your  prescribing physician to prescribe medications for treatment of your pain  Lumbar facet, medial branch nerve, blocks to be performed at time return appointment Wednesday, 02/01/2015. We will need to ask Dr. Doy Hutching permission to hold your aspirin for 5 days prior to the procedure   F/U PCP for evaliation of  BP and general medical  condition.  F/U surgical evaluation  F/U neurological evaluation  May consider radiofrequency rhizolysis or intraspinal procedures pending response to present treatment and F/U evaluation.  Patient to call [facility] should patient have concerns prior to scheduled return appointment.              GENERAL RISKS AND COMPLICATIONS  What are the risk, side effects and possible complications? Generally speaking, most procedures are safe.  However, with any procedure there are risks, side effects, and the possibility of complications.  The risks and complications are dependent upon the sites that are lesioned, or the type of nerve block to be performed.  The closer the procedure is to the spine, the more serious the risks are.  Great care is taken when placing the radio frequency needles, block needles or lesioning probes, but sometimes complications can occur. 1. Infection: Any time there is an injection through the skin, there is a risk of infection.  This is why sterile conditions are used for these blocks.  There are four possible types of infection. 1. Localized skin infection. 2. Central Nervous System Infection-This can be in the form of Meningitis, which can be deadly. 3. Epidural Infections-This can be in the form of an epidural abscess, which can cause pressure inside of the spine, causing compression of the spinal cord with subsequent paralysis. This would require an emergency surgery to decompress, and there are no guarantees that the patient would recover from the  paralysis. 4. Discitis-This is an infection of the intervertebral discs.  It occurs in about 1% of discography procedures.  It is difficult to treat and it may lead to surgery.        2. Pain: the needles have to go through skin and soft tissues, will cause soreness.       3. Damage to internal structures:  The nerves to be lesioned may be near blood vessels or    other nerves which can be potentially damaged.       4. Bleeding: Bleeding is more common if the patient is taking blood thinners such as  aspirin, Coumadin, Ticiid, Plavix, etc., or if he/she have some genetic predisposition  such as hemophilia. Bleeding into the spinal canal can cause compression of the spinal  cord with subsequent paralysis.  This would require an emergency surgery to  decompress and there are no guarantees that the patient would recover from the  paralysis.       5. Pneumothorax:  Puncturing of a lung is a possibility, every time a needle is introduced in  the area of the chest or upper back.  Pneumothorax refers to free air around the  collapsed lung(s), inside of the thoracic cavity (chest cavity).  Another two possible  complications related to a similar event would include: Hemothorax and Chylothorax.   These are variations of the Pneumothorax, where instead of air around the collapsed  lung(s), you may have blood or chyle, respectively.       6. Spinal headaches: They may occur with any procedures in the area of the spine.  7. Persistent CSF (Cerebro-Spinal Fluid) leakage: This is a rare problem, but may occur  with prolonged intrathecal or epidural catheters either due to the formation of a fistulous  track or a dural tear.       8. Nerve damage: By working so close to the spinal cord, there is always a possibility of  nerve damage, which could be as serious as a permanent spinal cord injury with  paralysis.       9. Death:  Although rare, severe deadly allergic reactions known as "Anaphylactic  reaction" can  occur to any of the medications used.      10. Worsening of the symptoms:  We can always make thing worse.  What are the chances of something like this happening? Chances of any of this occuring are extremely low.  By statistics, you have more of a chance of getting killed in a motor vehicle accident: while driving to the hospital than any of the above occurring .  Nevertheless, you should be aware that they are possibilities.  In general, it is similar to taking a shower.  Everybody knows that you can slip, hit your head and get killed.  Does that mean that you should not shower again?  Nevertheless always keep in mind that statistics do not mean anything if you happen to be on the wrong side of them.  Even if a procedure has a 1 (one) in a 1,000,000 (million) chance of going wrong, it you happen to be that one..Also, keep in mind that by statistics, you have more of a chance of having something go wrong when taking medications.  Who should not have this procedure? If you are on a blood thinning medication (e.g. Coumadin, Plavix, see list of "Blood Thinners"), or if you have an active infection going on, you should not have the procedure.  If you are taking any blood thinners, please inform your physician.  How should I prepare for this procedure?  Do not eat or drink anything at least six hours prior to the procedure.  Bring a driver with you .  It cannot be a taxi.  Come accompanied by an adult that can drive you back, and that is strong enough to help you if your legs get weak or numb from the local anesthetic.  Take all of your medicines the morning of the procedure with just enough water to swallow them.  If you have diabetes, make sure that you are scheduled to have your procedure done first thing in the morning, whenever possible.  If you have diabetes, take only half of your insulin dose and notify our nurse that you have done so as soon as you arrive at the clinic.  If you are  diabetic, but only take blood sugar pills (oral hypoglycemic), then do not take them on the morning of your procedure.  You may take them after you have had the procedure.  Do not take aspirin or any aspirin-containing medications, at least eleven (11) days prior to the procedure.  They may prolong bleeding.  Wear loose fitting clothing that may be easy to take off and that you would not mind if it got stained with Betadine or blood.  Do not wear any jewelry or perfume  Remove any nail coloring.  It will interfere with some of our monitoring equipment.  NOTE: Remember that this is not meant to be interpreted as a complete list of all possible complications.  Unforeseen problems may occur.  BLOOD THINNERS  The following drugs contain aspirin or other products, which can cause increased bleeding during surgery and should not be taken for 2 weeks prior to and 1 week after surgery.  If you should need take something for relief of minor pain, you may take acetaminophen which is found in Tylenol,m Datril, Anacin-3 and Panadol. It is not blood thinner. The products listed below are.  Do not take any of the products listed below in addition to any listed on your instruction sheet.  A.P.C or A.P.C with Codeine Codeine Phosphate Capsules #3 Ibuprofen Ridaura  ABC compound Congesprin Imuran rimadil  Advil Cope Indocin Robaxisal  Alka-Seltzer Effervescent Pain Reliever and Antacid Coricidin or Coricidin-D  Indomethacin Rufen  Alka-Seltzer plus Cold Medicine Cosprin Ketoprofen S-A-C Tablets  Anacin Analgesic Tablets or Capsules Coumadin Korlgesic Salflex  Anacin Extra Strength Analgesic tablets or capsules CP-2 Tablets Lanoril Salicylate  Anaprox Cuprimine Capsules Levenox Salocol  Anexsia-D Dalteparin Magan Salsalate  Anodynos Darvon compound Magnesium Salicylate Sine-off  Ansaid Dasin Capsules Magsal Sodium Salicylate  Anturane Depen Capsules Marnal Soma  APF Arthritis pain formula Dewitt's Pills  Measurin Stanback  Argesic Dia-Gesic Meclofenamic Sulfinpyrazone  Arthritis Bayer Timed Release Aspirin Diclofenac Meclomen Sulindac  Arthritis pain formula Anacin Dicumarol Medipren Supac  Analgesic (Safety coated) Arthralgen Diffunasal Mefanamic Suprofen  Arthritis Strength Bufferin Dihydrocodeine Mepro Compound Suprol  Arthropan liquid Dopirydamole Methcarbomol with Aspirin Synalgos  ASA tablets/Enseals Disalcid Micrainin Tagament  Ascriptin Doan's Midol Talwin  Ascriptin A/D Dolene Mobidin Tanderil  Ascriptin Extra Strength Dolobid Moblgesic Ticlid  Ascriptin with Codeine Doloprin or Doloprin with Codeine Momentum Tolectin  Asperbuf Duoprin Mono-gesic Trendar  Aspergum Duradyne Motrin or Motrin IB Triminicin  Aspirin plain, buffered or enteric coated Durasal Myochrisine Trigesic  Aspirin Suppositories Easprin Nalfon Trillsate  Aspirin with Codeine Ecotrin Regular or Extra Strength Naprosyn Uracel  Atromid-S Efficin Naproxen Ursinus  Auranofin Capsules Elmiron Neocylate Vanquish  Axotal Emagrin Norgesic Verin  Azathioprine Empirin or Empirin with Codeine Normiflo Vitamin E  Azolid Emprazil Nuprin Voltaren  Bayer Aspirin plain, buffered or children's or timed BC Tablets or powders Encaprin Orgaran Warfarin Sodium  Buff-a-Comp Enoxaparin Orudis Zorpin  Buff-a-Comp with Codeine Equegesic Os-Cal-Gesic   Buffaprin Excedrin plain, buffered or Extra Strength Oxalid   Bufferin Arthritis Strength Feldene Oxphenbutazone   Bufferin plain or Extra Strength Feldene Capsules Oxycodone with Aspirin   Bufferin with Codeine Fenoprofen Fenoprofen Pabalate or Pabalate-SF   Buffets II Flogesic Panagesic   Buffinol plain or Extra Strength Florinal or Florinal with Codeine Panwarfarin   Buf-Tabs Flurbiprofen Penicillamine   Butalbital Compound Four-way cold tablets Penicillin   Butazolidin Fragmin Pepto-Bismol   Carbenicillin Geminisyn Percodan   Carna Arthritis Reliever Geopen Persantine    Carprofen Gold's salt Persistin   Chloramphenicol Goody's Phenylbutazone   Chloromycetin Haltrain Piroxlcam   Clmetidine heparin Plaquenil   Cllnoril Hyco-pap Ponstel   Clofibrate Hydroxy chloroquine Propoxyphen         Before stopping any of these medications, be sure to consult the physician who ordered them.  Some, such as Coumadin (Warfarin) are ordered to prevent or treat serious conditions such as "deep thrombosis", "pumonary embolisms", and other heart problems.  The amount of time that you may need off of the medication may also vary with the medication and the reason for which you were taking it.  If you are taking any of these medications, please make sure you notify your pain physician before you undergo any procedures.         Facet Blocks Patient  Information  Description: The facets are joints in the spine between the vertebrae.  Like any joints in the body, facets can become irritated and painful.  Arthritis can also effect the facets.  By injecting steroids and local anesthetic in and around these joints, we can temporarily block the nerve supply to them.  Steroids act directly on irritated nerves and tissues to reduce selling and inflammation which often leads to decreased pain.  Facet blocks may be done anywhere along the spine from the neck to the low back depending upon the location of your pain.   After numbing the skin with local anesthetic (like Novocaine), a small needle is passed onto the facet joints under x-ray guidance.  You may experience a sensation of pressure while this is being done.  The entire block usually lasts about 15-25 minutes.   Conditions which may be treated by facet blocks:   Low back/buttock pain  Neck/shoulder pain  Certain types of headaches  Preparation for the injection:  1. Do not eat any solid food or dairy products within 6 hours of your appointment. 2. You may drink clear liquid up to 2 hours before appointment.  Clear liquids  include water, black coffee, juice or soda.  No milk or cream please. 3. You may take your regular medication, including pain medications, with a sip of water before your appointment.  Diabetics should hold regular insulin (if taken separately) and take 1/2 normal NPH dose the morning of the procedure.  Carry some sugar containing items with you to your appointment. 4. A driver must accompany you and be prepared to drive you home after your procedure. 5. Bring all your current medications with you. 6. An IV may be inserted and sedation may be given at the discretion of the physician. 7. A blood pressure cuff, EKG and other monitors will often be applied during the procedure.  Some patients may need to have extra oxygen administered for a short period. 8. You will be asked to provide medical information, including your allergies and medications, prior to the procedure.  We must know immediately if you are taking blood thinners (like Coumadin/Warfarin) or if you are allergic to IV iodine contrast (dye).  We must know if you could possible be pregnant.  Possible side-effects:   Bleeding from needle site  Infection (rare, may require surgery)  Nerve injury (rare)  Numbness & tingling (temporary)  Difficulty urinating (rare, temporary)  Spinal headache (a headache worse with upright posture)  Light-headedness (temporary)  Pain at injection site (serveral days)  Decreased blood pressure (rare, temporary)  Weakness in arm/leg (temporary)  Pressure sensation in back/neck (temporary)   Call if you experience:   Fever/chills associated with headache or increased back/neck pain  Headache worsened by an upright position  New onset, weakness or numbness of an extremity below the injection site  Hives or difficulty breathing (go to the emergency room)  Inflammation or drainage at the injection site(s)  Severe back/neck pain greater than usual  New symptoms which are concerning to  you  Please note:  Although the local anesthetic injected can often make your back or neck feel good for several hours after the injection, the pain will likely return. It takes 3-7 days for steroids to work.  You may not notice any pain relief for at least one week.  If effective, we will often do a series of 2-3 injections spaced 3-6 weeks apart to maximally decrease your pain.  After the initial series, you  may be a candidate for a more permanent nerve block of the facets.  If you have any questions, please call #[phone]) [facility]

## 2015-01-25 ENCOUNTER — Other Ambulatory Visit: Payer: Self-pay | Admitting: Pain Medicine

## 2015-02-01 ENCOUNTER — Encounter: Payer: Self-pay | Admitting: Pain Medicine

## 2015-02-01 ENCOUNTER — Ambulatory Visit: Payer: Medicare Other | Attending: Pain Medicine | Admitting: Pain Medicine

## 2015-02-01 VITALS — BP 107/69 | HR 72 | Temp 98.2°F | Resp 18 | Ht 63.0 in | Wt 230.0 lb

## 2015-02-01 DIAGNOSIS — M51369 Other intervertebral disc degeneration, lumbar region without mention of lumbar back pain or lower extremity pain: Secondary | ICD-10-CM

## 2015-02-01 DIAGNOSIS — M79604 Pain in right leg: Secondary | ICD-10-CM | POA: Diagnosis present

## 2015-02-01 DIAGNOSIS — M5134 Other intervertebral disc degeneration, thoracic region: Secondary | ICD-10-CM

## 2015-02-01 DIAGNOSIS — M47894 Other spondylosis, thoracic region: Secondary | ICD-10-CM

## 2015-02-01 DIAGNOSIS — M5481 Occipital neuralgia: Secondary | ICD-10-CM

## 2015-02-01 DIAGNOSIS — M79605 Pain in left leg: Secondary | ICD-10-CM | POA: Diagnosis present

## 2015-02-01 DIAGNOSIS — G43101 Migraine with aura, not intractable, with status migrainosus: Secondary | ICD-10-CM

## 2015-02-01 DIAGNOSIS — M545 Low back pain: Secondary | ICD-10-CM | POA: Diagnosis present

## 2015-02-01 DIAGNOSIS — M47814 Spondylosis without myelopathy or radiculopathy, thoracic region: Secondary | ICD-10-CM

## 2015-02-01 DIAGNOSIS — M5136 Other intervertebral disc degeneration, lumbar region: Secondary | ICD-10-CM

## 2015-02-01 DIAGNOSIS — M47816 Spondylosis without myelopathy or radiculopathy, lumbar region: Secondary | ICD-10-CM

## 2015-02-01 MED ORDER — TRIAMCINOLONE ACETONIDE 40 MG/ML IJ SUSP
INTRAMUSCULAR | Status: AC
  Administered 2015-02-01: 12:00:00
  Filled 2015-02-01: qty 1

## 2015-02-01 MED ORDER — MIDAZOLAM HCL 5 MG/5ML IJ SOLN
INTRAMUSCULAR | Status: AC
Start: 2015-02-01 — End: 2015-02-01
  Administered 2015-02-01: 5 mg via INTRAVENOUS
  Filled 2015-02-01: qty 5

## 2015-02-01 MED ORDER — FENTANYL CITRATE (PF) 100 MCG/2ML IJ SOLN
INTRAMUSCULAR | Status: AC
  Administered 2015-02-01: 100 ug via INTRAVENOUS
  Filled 2015-02-01: qty 2

## 2015-02-01 MED ORDER — ORPHENADRINE CITRATE 30 MG/ML IJ SOLN
INTRAMUSCULAR | Status: AC
  Administered 2015-02-01: 12:00:00
  Filled 2015-02-01: qty 2

## 2015-02-01 MED ORDER — BUPIVACAINE HCL (PF) 0.25 % IJ SOLN
INTRAMUSCULAR | Status: AC
  Administered 2015-02-01: 12:00:00
  Filled 2015-02-01: qty 30

## 2015-02-01 NOTE — Progress Notes (Signed)
Safety precautions to be maintained throughout the outpatient stay will include: orient to surroundings, keep bed in low position, maintain call bell within reach at all times, provide assistance with transfer out of bed and ambulation.  

## 2015-02-01 NOTE — Patient Instructions (Addendum)
Continue present medications  F/U PCP for evaliation of  BP and general medical  condition.  F/U surgical evaluation  F/U neurological evaluation  May consider radiofrequency rhizolysis or intraspinal procedures pending response to present treatment and F/U evaluation.  Patient to call Pain Management Center should patient have concerns prior to scheduled return appointment. Pain Management Discharge Instructions  General Discharge Instructions :  If you need to reach your doctor call: Monday-Friday 8:00 am - 4:00 pm at [phone] or toll free [phone].  After clinic hours [phone] to have operator reach doctor.  Bring all of your medication bottles to all your appointments in the pain clinic.  To cancel or reschedule your appointment with Pain Management please remember to call 24 hours in advance to avoid a fee.  Refer to the educational materials which you have been given on: General Risks, I had my Procedure. Discharge Instructions, Post Sedation.  Post Procedure Instructions:  The drugs you were given will stay in your system until tomorrow, so for the next 24 hours you should not drive, make any legal decisions or drink any alcoholic beverages.  You may eat anything you prefer, but it is better to start with liquids then soups and crackers, and gradually work up to solid foods.  Please notify your doctor immediately if you have any unusual bleeding, trouble breathing or pain that is not related to your normal pain.  Depending on the type of procedure that was done, some parts of your body may feel week and/or numb.  This usually clears up by tonight or the next day.  Walk with the use of an assistive device or accompanied by an adult for the 24 hours.  You may use ice on the affected area for the first 24 hours.  Put ice in a Ziploc bag and cover with a towel and place against area 15 minutes on 15 minutes off.  You may switch to heat after 24 hours.Pain Management  Discharge Instructions  General Discharge Instructions :  If you need to reach your doctor call: Monday-Friday 8:00 am - 4:00 pm at [phone] or toll free [phone].  After clinic hours [phone] to have operator reach doctor.  Bring all of your medication bottles to all your appointments in the pain clinic.  To cancel or reschedule your appointment with Pain Management please remember to call 24 hours in advance to avoid a fee.  Refer to the educational materials which you have been given on: General Risks, I had my Procedure. Discharge Instructions, Post Sedation.  Post Procedure Instructions:  The drugs you were given will stay in your system until tomorrow, so for the next 24 hours you should not drive, make any legal decisions or drink any alcoholic beverages.  You may eat anything you prefer, but it is better to start with liquids then soups and crackers, and gradually work up to solid foods.  Please notify your doctor immediately if you have any unusual bleeding, trouble breathing or pain that is not related to your normal pain.  Depending on the type of procedure that was done, some parts of your body may feel week and/or numb.  This usually clears up by tonight or the next day.  Walk with the use of an assistive device or accompanied by an adult for the 24 hours.  You may use ice on the affected area for the first 24 hours.  Put ice in a Ziploc bag and cover with a towel and place against area 15  minutes on 15 minutes off.  You may switch to heat after 24 hours.Facet Blocks Patient Information  Description: The facets are joints in the spine between the vertebrae.  Like any joints in the body, facets can become irritated and painful.  Arthritis can also effect the facets.  By injecting steroids and local anesthetic in and around these joints, we can temporarily block the nerve supply to them.  Steroids act directly on irritated nerves and tissues to reduce selling and  inflammation which often leads to decreased pain.  Facet blocks may be done anywhere along the spine from the neck to the low back depending upon the location of your pain.   After numbing the skin with local anesthetic (like Novocaine), a small needle is passed onto the facet joints under x-ray guidance.  You may experience a sensation of pressure while this is being done.  The entire block usually lasts about 15-25 minutes.   Conditions which may be treated by facet blocks:   Low back/buttock pain  Neck/shoulder pain  Certain types of headaches  Preparation for the injection:  1. Do not eat any solid food or dairy products within 6 hours of your appointment. 2. You may drink clear liquid up to 2 hours before appointment.  Clear liquids include water, black coffee, juice or soda.  No milk or cream please. 3. You may take your regular medication, including pain medications, with a sip of water before your appointment.  Diabetics should hold regular insulin (if taken separately) and take 1/2 normal NPH dose the morning of the procedure.  Carry some sugar containing items with you to your appointment. 4. A driver must accompany you and be prepared to drive you home after your procedure. 5. Bring all your current medications with you. 6. An IV may be inserted and sedation may be given at the discretion of the physician. 7. A blood pressure cuff, EKG and other monitors will often be applied during the procedure.  Some patients may need to have extra oxygen administered for a short period. 8. You will be asked to provide medical information, including your allergies and medications, prior to the procedure.  We must know immediately if you are taking blood thinners (like Coumadin/Warfarin) or if you are allergic to IV iodine contrast (dye).  We must know if you could possible be pregnant.  Possible side-effects:   Bleeding from needle site  Infection (rare, may require surgery)  Nerve injury  (rare)  Numbness & tingling (temporary)  Difficulty urinating (rare, temporary)  Spinal headache (a headache worse with upright posture)  Light-headedness (temporary)  Pain at injection site (serveral days)  Decreased blood pressure (rare, temporary)  Weakness in arm/leg (temporary)  Pressure sensation in back/neck (temporary)   Call if you experience:   Fever/chills associated with headache or increased back/neck pain  Headache worsened by an upright position  New onset, weakness or numbness of an extremity below the injection site  Hives or difficulty breathing (go to the emergency room)  Inflammation or drainage at the injection site(s)  Severe back/neck pain greater than usual  New symptoms which are concerning to you  Please note:  Although the local anesthetic injected can often make your back or neck feel good for several hours after the injection, the pain will likely return. It takes 3-7 days for steroids to work.  You may not notice any pain relief for at least one week.  If effective, we will often do a series of 2-3  injections spaced 3-6 weeks apart to maximally decrease your pain.  After the initial series, you may be a candidate for a more permanent nerve block of the facets.  If you have any questions, please call #[phone]) [facility]

## 2015-02-01 NOTE — Progress Notes (Signed)
Subjective:    Patient ID: Rachel Shaffer, female    DOB: 1951/06/14, 64 y.o.   MRN: [id number]  HPI  PROCEDURE PERFORMED: Lumbar facet (medial branch block)   NOTE: The patient is a 64 y.o. female who returns to [facility] for further evaluation and treatment of pain involving the lumbar and lower extremity region. MRI  revealed the patient to be with evidence of multilevel degenerative changes of the lumbar spine with significant facet degenerative changes and degenerative disc disease. There is concern regarding significant component of patient's pain being due to facet degenerative changes. The risks, benefits, and expectations of the procedure have been discussed and explained to the patient who was understanding and in agreement with suggested treatment plan. We will proceed with interventional treatment as discussed and as explained to the patient who was understanding and wished to proceed with procedure as planned.   DESCRIPTION OF PROCEDURE: Lumbar facet (medial branch block) with IV Versed, IV fentanyl conscious sedation, EKG, blood pressure, pulse, and pulse oximetry monitoring. The procedure was performed with the patient in the prone position. Betadine prep of proposed entry site performed.   NEEDLE PLACEMENT AT: Left L 3 lumbar facet (medial branch block). Under fluoroscopic guidance with oblique orientation of 15 degrees, a 22-gauge needle was inserted at the L 3 vertebral body level with needle placed at the targeted area of Burton's Eye or Eye of the Scotty Dog with documentation of needle placement in the superior and lateral border of targeted area of Burton's Eye or Eye of the Scotty Dog with oblique orientation of 15 degrees. Following documentation of needle placement at the L 3 vertebral body level, needle placement was then accomplished at the L 4 vertebral body level.   NEEDLE PLACEMENT AT L4 and L5 VERTEBRAL BODY LEVELS ON THE LEFT SIDE The procedure was  performed at the L4 and L5 vertebral body levels exactly as was performed at the L 3 vertebral body level utilizing the same technique and under fluoroscopic guidance.  NEEDLE PLACEMENT AT THE SACRAL ALA with AP view of the lumbosacral spine. With the patient in the prone position, Betadine prep of proposed entry site accomplished, a 22 gauge needle was inserted in the region of the sacral ala (groove formed by the superior articulating process of S1 and the sacral wing). Following documentation of needle placement at the sacral ala,  needle placement was then accomplished at the S1 foramen level.   NEEDLE PLACEMENT AT THE S1 FORAMEN LEVEL under fluoroscopic guidance with AP view of the lumbosacral spine and cephalad orientation of the fluoroscope, a 22-gauge needle was placed at the superior and lateral border of the S1 foramen under fluoroscopic guidance. Following documentation of needle placement at the S1 foramen.   Needle placement was then verified at all levels on lateral view. Following documentation of needle placement at all levels on lateral view and following negative aspiration for heme and CSF, each level was injected with 1 mL of 0.25% bupivacaine with Kenalog.     LUMBAR FACET, MEDIAL BRANCH NERVE, BLOCKS PERFORMED ON THE RIGHT SIDE   The procedure was performed on the right side exactly as was performed on the left side at the same levels and utilizing the same technique under fluoroscopic guidance.     The patient tolerated the procedure well. A total of 40 mg of Kenalog was utilized for the procedure.   PLAN:  1. Medications: The patient will continue presently prescribed medications. 2. May consider  modification of treatment regimen at time of return appointment pending response to treatment rendered on today's visit. 3. The patient is to follow-up with primary care physician Dr. Doy Hutching for further evaluation of blood pressure and general medical condition status post  steroid injection performed on today's visit. 4. Surgical follow-up evaluation as discussed. 5. Neurological follow-up evaluation. 6. The patient may be candidate for radiofrequency procedures, implantation type procedures, and other treatment pending response to treatment and follow-up evaluation. 7. The patient has been advised to call the [facility] prior to scheduled return appointment should there be significant change in condition or should patient have other concerns regarding condition prior to scheduled return appointment.  The patient is understanding and in agreement with suggested treatment plan.     Review of Systems     Objective:   Physical Exam        Assessment & Plan:

## 2015-02-02 ENCOUNTER — Telehealth: Payer: Self-pay | Admitting: *Deleted

## 2015-02-02 NOTE — Telephone Encounter (Signed)
Left msg

## 2015-03-07 ENCOUNTER — Ambulatory Visit: Payer: Medicare Other | Admitting: Pain Medicine

## 2015-04-05 ENCOUNTER — Encounter: Payer: Self-pay | Admitting: Pain Medicine

## 2015-04-12 ENCOUNTER — Encounter
Admission: RE | Admit: 2015-04-12 | Discharge: 2015-04-12 | Disposition: A | Discharge status: Home / Self Care | Payer: Medicare Other | Source: Ambulatory Visit | Attending: Unknown Physician Specialty | Admitting: Unknown Physician Specialty

## 2015-04-12 DIAGNOSIS — Z01818 Encounter for other preprocedural examination: Secondary | ICD-10-CM | POA: Diagnosis not present

## 2015-04-12 HISTORY — DX: Anemia, unspecified: D64.9

## 2015-04-12 HISTORY — DX: Unspecified asthma, uncomplicated: J45.909

## 2015-04-12 HISTORY — DX: Family history of other specified conditions: Z84.89

## 2015-04-12 HISTORY — DX: Major depressive disorder, single episode, unspecified: F32.9

## 2015-04-12 HISTORY — DX: Adverse effect of unspecified anesthetic, initial encounter: T41.45XA

## 2015-04-12 HISTORY — DX: Depression, unspecified: F32.A

## 2015-04-12 HISTORY — DX: Atherosclerotic heart disease of native coronary artery without angina pectoris: I25.10

## 2015-04-12 HISTORY — DX: Other specified postprocedural states: Z98.890

## 2015-04-12 HISTORY — DX: Other reaction to spinal and lumbar puncture: G97.1

## 2015-04-12 HISTORY — DX: Other cervical disc degeneration, unspecified cervical region: M50.30

## 2015-04-12 HISTORY — DX: Other specified postprocedural states: R11.2

## 2015-04-12 HISTORY — DX: Hypothyroidism, unspecified: E03.9

## 2015-04-12 HISTORY — DX: Unspecified chronic bronchitis: J42

## 2015-04-12 HISTORY — DX: Pain, unspecified: R52

## 2015-04-12 HISTORY — DX: Other complications of anesthesia, initial encounter: T88.59XA

## 2015-04-12 LAB — URINALYSIS COMPLETE WITH MICROSCOPIC (ARMC ONLY)
BACTERIA UA: NONE SEEN
BILIRUBIN URINE: NEGATIVE
GLUCOSE, UA: NEGATIVE mg/dL
HGB URINE DIPSTICK: NEGATIVE
Ketones, ur: NEGATIVE mg/dL
LEUKOCYTES UA: NEGATIVE
NITRITE: NEGATIVE
Protein, ur: NEGATIVE mg/dL
RBC / HPF: NONE SEEN RBC/hpf (ref 0–5)
SPECIFIC GRAVITY, URINE: 1.015 (ref 1.005–1.030)
pH: 5 (ref 5.0–8.0)

## 2015-04-12 LAB — CBC
HEMATOCRIT: 40.7 % (ref 35.0–47.0)
Hemoglobin: 13.6 g/dL (ref 12.0–16.0)
MCH: 30.9 pg (ref 26.0–34.0)
MCHC: 33.3 g/dL (ref 32.0–36.0)
MCV: 92.9 fL (ref 80.0–100.0)
PLATELETS: 276 10*3/uL (ref 150–440)
RBC: 4.38 MIL/uL (ref 3.80–5.20)
RDW: 13.1 % (ref 11.5–14.5)
WBC: 8 10*3/uL (ref 3.6–11.0)

## 2015-04-12 LAB — ABO/RH: ABO/RH(D): O POS

## 2015-04-12 LAB — PROTIME-INR
INR: 1
Prothrombin Time: 13.4 seconds (ref 11.4–15.0)

## 2015-04-12 LAB — BASIC METABOLIC PANEL
ANION GAP: 8 (ref 5–15)
BUN: 19 mg/dL (ref 6–20)
CO2: 27 mmol/L (ref 22–32)
Calcium: 9.2 mg/dL (ref 8.9–10.3)
Chloride: 106 mmol/L (ref 101–111)
Creatinine, Ser: 1.19 mg/dL — ABNORMAL HIGH (ref 0.44–1.00)
GFR, EST AFRICAN AMERICAN: 54 mL/min — AB (ref >60)
GFR, EST NON AFRICAN AMERICAN: 47 mL/min — AB (ref >60)
Glucose, Bld: 94 mg/dL (ref 65–99)
Potassium: 4.4 mmol/L (ref 3.5–5.1)
Sodium: 141 mmol/L (ref 135–145)

## 2015-04-12 LAB — TYPE AND SCREEN
ABO/RH(D): O POS
ANTIBODY SCREEN: NEGATIVE

## 2015-04-12 LAB — SURGICAL PCR SCREEN
MRSA, PCR: NEGATIVE
Staphylococcus aureus: NEGATIVE

## 2015-04-12 LAB — APTT: aPTT: 28 seconds (ref 24–36)

## 2015-04-12 NOTE — Patient Instructions (Signed)
  190/12/16@ADMITDT2 @ Report to Day Surgery. To find out your arrival time please call [phone] between 1PM - 3PM on 04/25/15.  Remember: Instructions that are not followed completely may result in serious medical risk, up to and including death, or upon the discretion of your surgeon and anesthesiologist your surgery may need to be rescheduled.    ___x_ 1. Do not eat food or drink liquids after midnight. No gum chewing or hard candies.     ___x_ 2. No Alcohol for 24 hours before or after surgery.   ____ 3. Bring all medications with you on the day of surgery if instructed.    _x___ 4. Notify your doctor if there is any change in your medical condition     (cold, fever, infections).     Do not wear jewelry, make-up, hairpins, clips or nail polish.  Do not wear lotions, powders, or perfumes. You may wear deodorant.  Do not shave 48 hours prior to surgery. Men may shave face and neck.  Do not bring valuables to the hospital.    [facility] is not responsible for any belongings or valuables.               Contacts, dentures or bridgework may not be worn into surgery.  Leave your suitcase in the car. After surgery it may be brought to your room.  For patients admitted to the hospital, discharge time is determined by your                treatment team.   Patients discharged the day of surgery will not be allowed to drive home.   Please read over the following fact sheets that you were given:   Surgical Site Infection Prevention   __x__ Take these medicines the morning of surgery with A SIP OF WATER:    1. lisinopril  2. Prevacid  3. celexa  4.  5.  6.  ____ Fleet Enema (as directed)   __x__ Use CHG Soap as directed  ____ Use inhalers on the day of surgery  ____ Stop metformin 2 days prior to surgery    ____ Take 1/2 of usual insulin dose the night before surgery and none on the morning of surgery.   _x___ Stop Coumadin/Plavix/aspirin on  STOP 7 DAYS PREOP  __X__  Stop Anti-inflammatories on  STOP 7 DAYS PREOP   __X__ Stop supplements until after surgery. STOP PRIMROSE OIL,CO Q 10,GINKO 7 DAYS PREOP   ____ Bring C-Pap to the hospital.

## 2015-04-13 NOTE — Patient Instructions (Addendum)
  Your procedure is scheduled on: 04/26/15 Report to Day Surgery. To find out your arrival time please call [phone] between 1PM - 3PM on  04/25/15.  Remember: Instructions that are not followed completely may result in serious medical risk, up to and including death, or upon the discretion of your surgeon and anesthesiologist your surgery may need to be rescheduled.    _x___ 1. Do not eat food or drink liquids after midnight. No gum chewing or hard candies.     __x__ 2. No Alcohol for 24 hours before or after surgery.   ____ 3. Bring all medications with you on the day of surgery if instructed.    __x__ 4. Notify your doctor if there is any change in your medical condition     (cold, fever, infections).     Do not wear jewelry, make-up, hairpins, clips or nail polish.  Do not wear lotions, powders, or perfumes. You may wear deodorant.  Do not shave 48 hours prior to surgery. Men may shave face and neck.  Do not bring valuables to the hospital.    [facility] is not responsible for any belongings or valuables.               Contacts, dentures or bridgework may not be worn into surgery.  Leave your suitcase in the car. After surgery it may be brought to your room.  For patients admitted to the hospital, discharge time is determined by your                treatment team.   Patients discharged the day of surgery will not be allowed to drive home.   Please read over the following fact sheets that you were given:   Surgical Site Infection Prevention   ____ Take these medicines the morning of surgery with A SIP OF WATER:    1. lisinopril  2. prevacid  3. celexa  4.  5.  6.  ____ Fleet Enema (as directed)   _x___ Use CHG Soap as directed  ____ Use inhalers on the day of surgery  ____ Stop metformin 2 days prior to surgery    ____ Take 1/2 of usual insulin dose the night before surgery and none on the morning of surgery.   _x__ Stop Coumadin/Plavix/aspirin on  Stop  aspirin 7 days before surgery _x___ Stop Anti-inflammatories on   Stop 7 days before surgery   _x___ Stop supplements until after surgery.  Stop primrose oil,co q 10, ginko 7 days before surgery  ____ Bring C-Pap to the hospital.

## 2015-04-24 NOTE — OR Nursing (Signed)
States took morphine over weekend and had severe n/v. Added to allergies. States can take Versed and Fentanyl without problems

## 2015-04-26 ENCOUNTER — Inpatient Hospital Stay: Payer: Medicare Other | Admitting: Anesthesiology

## 2015-04-26 ENCOUNTER — Inpatient Hospital Stay
Admission: RE | Admit: 2015-04-26 | Discharge: 2015-04-29 | DRG: 470 | Disposition: A | Discharge status: Skilled Nursing Facility | Payer: Medicare Other | Source: Ambulatory Visit | Attending: Unknown Physician Specialty | Admitting: Unknown Physician Specialty

## 2015-04-26 ENCOUNTER — Encounter
Admission: RE | Disposition: A | Discharge status: Skilled Nursing Facility | Payer: Self-pay | Source: Ambulatory Visit | Attending: Unknown Physician Specialty

## 2015-04-26 ENCOUNTER — Encounter: Payer: Self-pay | Admitting: *Deleted

## 2015-04-26 ENCOUNTER — Inpatient Hospital Stay: Payer: Medicare Other

## 2015-04-26 DIAGNOSIS — M797 Fibromyalgia: Secondary | ICD-10-CM | POA: Diagnosis present

## 2015-04-26 DIAGNOSIS — Z90710 Acquired absence of both cervix and uterus: Secondary | ICD-10-CM

## 2015-04-26 DIAGNOSIS — J45909 Unspecified asthma, uncomplicated: Secondary | ICD-10-CM | POA: Diagnosis present

## 2015-04-26 DIAGNOSIS — M810 Age-related osteoporosis without current pathological fracture: Secondary | ICD-10-CM | POA: Diagnosis present

## 2015-04-26 DIAGNOSIS — Z8249 Family history of ischemic heart disease and other diseases of the circulatory system: Secondary | ICD-10-CM | POA: Diagnosis not present

## 2015-04-26 DIAGNOSIS — I2510 Atherosclerotic heart disease of native coronary artery without angina pectoris: Secondary | ICD-10-CM | POA: Diagnosis present

## 2015-04-26 DIAGNOSIS — Z8673 Personal history of transient ischemic attack (TIA), and cerebral infarction without residual deficits: Secondary | ICD-10-CM | POA: Diagnosis not present

## 2015-04-26 DIAGNOSIS — K219 Gastro-esophageal reflux disease without esophagitis: Secondary | ICD-10-CM | POA: Diagnosis present

## 2015-04-26 DIAGNOSIS — M5030 Other cervical disc degeneration, unspecified cervical region: Secondary | ICD-10-CM | POA: Diagnosis present

## 2015-04-26 DIAGNOSIS — M1711 Unilateral primary osteoarthritis, right knee: Principal | ICD-10-CM | POA: Diagnosis present

## 2015-04-26 DIAGNOSIS — Z823 Family history of stroke: Secondary | ICD-10-CM | POA: Diagnosis not present

## 2015-04-26 DIAGNOSIS — I10 Essential (primary) hypertension: Secondary | ICD-10-CM | POA: Diagnosis present

## 2015-04-26 DIAGNOSIS — Z9889 Other specified postprocedural states: Secondary | ICD-10-CM | POA: Diagnosis not present

## 2015-04-26 DIAGNOSIS — Z79899 Other long term (current) drug therapy: Secondary | ICD-10-CM

## 2015-04-26 DIAGNOSIS — E669 Obesity, unspecified: Secondary | ICD-10-CM | POA: Diagnosis present

## 2015-04-26 DIAGNOSIS — J42 Unspecified chronic bronchitis: Secondary | ICD-10-CM | POA: Diagnosis present

## 2015-04-26 DIAGNOSIS — Z9049 Acquired absence of other specified parts of digestive tract: Secondary | ICD-10-CM

## 2015-04-26 DIAGNOSIS — G8918 Other acute postprocedural pain: Secondary | ICD-10-CM

## 2015-04-26 DIAGNOSIS — I351 Nonrheumatic aortic (valve) insufficiency: Secondary | ICD-10-CM | POA: Diagnosis present

## 2015-04-26 DIAGNOSIS — Z96659 Presence of unspecified artificial knee joint: Secondary | ICD-10-CM

## 2015-04-26 DIAGNOSIS — Z7982 Long term (current) use of aspirin: Secondary | ICD-10-CM | POA: Diagnosis not present

## 2015-04-26 HISTORY — PX: TOTAL KNEE ARTHROPLASTY: SHX125

## 2015-04-26 LAB — CBC
HCT: 36.4 % (ref 35.0–47.0)
HEMOGLOBIN: 11.7 g/dL — AB (ref 12.0–16.0)
MCH: 29.8 pg (ref 26.0–34.0)
MCHC: 32.1 g/dL (ref 32.0–36.0)
MCV: 93 fL (ref 80.0–100.0)
Platelets: 232 10*3/uL (ref 150–440)
RBC: 3.92 MIL/uL (ref 3.80–5.20)
RDW: 13 % (ref 11.5–14.5)
WBC: 14.2 10*3/uL — ABNORMAL HIGH (ref 3.6–11.0)

## 2015-04-26 LAB — CREATININE, SERUM
CREATININE: 1.13 mg/dL — AB (ref 0.44–1.00)
GFR calc Af Amer: 57 mL/min — ABNORMAL LOW (ref >60)
GFR, EST NON AFRICAN AMERICAN: 50 mL/min — AB (ref >60)

## 2015-04-26 SURGERY — ARTHROPLASTY, KNEE, TOTAL
Anesthesia: General | Laterality: Right

## 2015-04-26 MED ORDER — ACETAMINOPHEN 10 MG/ML IV SOLN
INTRAVENOUS | Status: AC
  Filled 2015-04-26: qty 100

## 2015-04-26 MED ORDER — ENOXAPARIN SODIUM 30 MG/0.3ML ~~LOC~~ SOLN: 30.0000 mg | Freq: Two times a day (BID) | SUBCUTANEOUS | Status: DC

## 2015-04-26 MED ORDER — TRANEXAMIC ACID 1000 MG/10ML IV SOLN: 2000.0000 mg | Freq: Once | INTRAVENOUS | Status: DC

## 2015-04-26 MED ORDER — PROPRANOLOL HCL 40 MG PO TABS
40.0000 mg | ORAL_TABLET | Freq: Every day | ORAL | Status: DC
  Administered 2015-04-27: 40 mg via ORAL
  Filled 2015-04-26 (×2): qty 1

## 2015-04-26 MED ORDER — COQ-10 400 MG PO CAPS: ORAL_CAPSULE | Freq: Every day | ORAL | Status: DC

## 2015-04-26 MED ORDER — MEPERIDINE HCL 50 MG PO TABS
50.0000 mg | ORAL_TABLET | ORAL | Status: DC | PRN
  Administered 2015-04-26 – 2015-04-29 (×10): 50 mg via ORAL
  Filled 2015-04-26 (×11): qty 1

## 2015-04-26 MED ORDER — SODIUM CHLORIDE 0.9 % IJ SOLN
INTRAMUSCULAR | Status: AC
  Filled 2015-04-26: qty 3

## 2015-04-26 MED ORDER — FENTANYL CITRATE (PF) 100 MCG/2ML IJ SOLN
INTRAMUSCULAR | Status: AC
  Filled 2015-04-26: qty 2

## 2015-04-26 MED ORDER — TRANEXAMIC ACID 1000 MG/10ML IV SOLN
INTRAVENOUS | Status: AC
  Filled 2015-04-26: qty 10

## 2015-04-26 MED ORDER — LORAZEPAM 2 MG PO TABS
4.0000 mg | ORAL_TABLET | Freq: Every day | ORAL | Status: DC
  Administered 2015-04-26 – 2015-04-28 (×3): 4 mg via ORAL
  Filled 2015-04-26 (×3): qty 2

## 2015-04-26 MED ORDER — MIDAZOLAM HCL 2 MG/2ML IJ SOLN
INTRAMUSCULAR | Status: DC | PRN
  Administered 2015-04-26: 2 mg via INTRAVENOUS

## 2015-04-26 MED ORDER — ONDANSETRON HCL 4 MG/2ML IJ SOLN: 4.0000 mg | Freq: Once | INTRAMUSCULAR | Status: DC | PRN

## 2015-04-26 MED ORDER — CIPROFLOXACIN IN D5W 400 MG/200ML IV SOLN
400.0000 mg | Freq: Two times a day (BID) | INTRAVENOUS | Status: AC
  Administered 2015-04-26 – 2015-04-27 (×2): 400 mg via INTRAVENOUS
  Filled 2015-04-26 (×2): qty 200

## 2015-04-26 MED ORDER — ENOXAPARIN SODIUM 40 MG/0.4ML ~~LOC~~ SOLN
40.0000 mg | Freq: Two times a day (BID) | SUBCUTANEOUS | Status: DC
  Administered 2015-04-27 – 2015-04-29 (×5): 40 mg via SUBCUTANEOUS
  Filled 2015-04-26 (×5): qty 0.4

## 2015-04-26 MED ORDER — NEOMYCIN-POLYMYXIN B GU 40-200000 IR SOLN
Status: AC
  Filled 2015-04-26: qty 20

## 2015-04-26 MED ORDER — SIMVASTATIN 40 MG PO TABS
40.0000 mg | ORAL_TABLET | Freq: Every evening | ORAL | Status: DC
Start: 2015-04-26 — End: 2015-04-29
  Administered 2015-04-26 – 2015-04-28 (×3): 40 mg via ORAL
  Filled 2015-04-26 (×3): qty 1

## 2015-04-26 MED ORDER — ONDANSETRON HCL 4 MG/2ML IJ SOLN
INTRAMUSCULAR | Status: AC
  Filled 2015-04-26: qty 2

## 2015-04-26 MED ORDER — FOLIC ACID 1 MG PO TABS
1.0000 mg | ORAL_TABLET | Freq: Every morning | ORAL | Status: DC
  Administered 2015-04-27 – 2015-04-29 (×3): 1 mg via ORAL
  Filled 2015-04-26 (×3): qty 1

## 2015-04-26 MED ORDER — ONDANSETRON HCL 4 MG PO TABS
4.0000 mg | ORAL_TABLET | Freq: Four times a day (QID) | ORAL | Status: DC | PRN
  Administered 2015-04-29: 4 mg via ORAL
  Filled 2015-04-26: qty 1

## 2015-04-26 MED ORDER — KCL IN DEXTROSE-NACL 20-5-0.45 MEQ/L-%-% IV SOLN
INTRAVENOUS | Status: DC
  Administered 2015-04-26 – 2015-04-27 (×2): via INTRAVENOUS
  Filled 2015-04-26 (×7): qty 1000

## 2015-04-26 MED ORDER — ROCURONIUM BROMIDE 100 MG/10ML IV SOLN
INTRAVENOUS | Status: DC | PRN
  Administered 2015-04-26 (×3): 10 mg via INTRAVENOUS
  Administered 2015-04-26: 40 mg via INTRAVENOUS

## 2015-04-26 MED ORDER — HYDROXYZINE HCL 25 MG PO TABS
25.0000 mg | ORAL_TABLET | Freq: Four times a day (QID) | ORAL | Status: DC | PRN
  Administered 2015-04-26: 25 mg via ORAL
  Filled 2015-04-26: qty 1

## 2015-04-26 MED ORDER — SODIUM CHLORIDE 0.9 % IJ SOLN: INTRAMUSCULAR | Status: DC | PRN

## 2015-04-26 MED ORDER — BIOTIN 5000 MCG PO CAPS: 1.0000 | ORAL_CAPSULE | Freq: Every day | ORAL | Status: DC

## 2015-04-26 MED ORDER — FAMOTIDINE 20 MG PO TABS: 20.0000 mg | ORAL_TABLET | Freq: Once | ORAL | Status: DC

## 2015-04-26 MED ORDER — MEPERIDINE HCL 25 MG/ML IJ SOLN: 25.0000 mg | INTRAMUSCULAR | Status: DC | PRN

## 2015-04-26 MED ORDER — DIPHENOXYLATE-ATROPINE 2.5-0.025 MG PO TABS: 1.0000 | ORAL_TABLET | Freq: Four times a day (QID) | ORAL | Status: DC | PRN

## 2015-04-26 MED ORDER — EVENING PRIMROSE OIL 500 MG PO CAPS: ORAL_CAPSULE | Freq: Every day | ORAL | Status: DC

## 2015-04-26 MED ORDER — SODIUM CHLORIDE 0.9 % IJ SOLN
INTRAMUSCULAR | Status: AC
  Filled 2015-04-26: qty 50

## 2015-04-26 MED ORDER — SCOPOLAMINE 1 MG/3DAYS TD PT72
MEDICATED_PATCH | TRANSDERMAL | Status: AC
  Administered 2015-04-26: 1.5 mg via TRANSDERMAL
  Filled 2015-04-26: qty 1

## 2015-04-26 MED ORDER — FENTANYL CITRATE (PF) 100 MCG/2ML IJ SOLN
25.0000 ug | INTRAMUSCULAR | Status: AC | PRN
  Administered 2015-04-26 (×6): 25 ug via INTRAVENOUS

## 2015-04-26 MED ORDER — FERROUS SULFATE 325 (65 FE) MG PO TABS
325.0000 mg | ORAL_TABLET | Freq: Two times a day (BID) | ORAL | Status: DC
  Administered 2015-04-26 – 2015-04-29 (×5): 325 mg via ORAL
  Filled 2015-04-26 (×5): qty 1

## 2015-04-26 MED ORDER — EPHEDRINE SULFATE 50 MG/ML IJ SOLN
INTRAMUSCULAR | Status: DC | PRN
Start: 2015-04-26 — End: 2015-04-26
  Administered 2015-04-26: 10 mg via INTRAVENOUS

## 2015-04-26 MED ORDER — FENTANYL CITRATE (PF) 100 MCG/2ML IJ SOLN
INTRAMUSCULAR | Status: DC | PRN
  Administered 2015-04-26: 100 ug via INTRAVENOUS
  Administered 2015-04-26 (×4): 50 ug via INTRAVENOUS

## 2015-04-26 MED ORDER — BUPIVACAINE-EPINEPHRINE (PF) 0.5% -1:200000 IJ SOLN
INTRAMUSCULAR | Status: DC | PRN
  Administered 2015-04-26: 30 mL

## 2015-04-26 MED ORDER — SCOPOLAMINE 1 MG/3DAYS TD PT72
1.0000 | MEDICATED_PATCH | TRANSDERMAL | Status: DC
  Administered 2015-04-26: 1.5 mg via TRANSDERMAL

## 2015-04-26 MED ORDER — SODIUM CHLORIDE 0.9 % IV SOLN
INTRAVENOUS | Status: DC | PRN
  Administered 2015-04-26: 60 mL
  Administered 2015-04-26: 10:00:00

## 2015-04-26 MED ORDER — BUPIVACAINE LIPOSOME 1.3 % IJ SUSP
INTRAMUSCULAR | Status: AC
  Filled 2015-04-26: qty 20

## 2015-04-26 MED ORDER — PANTOPRAZOLE SODIUM 40 MG PO TBEC: 40.0000 mg | DELAYED_RELEASE_TABLET | Freq: Every day | ORAL | Status: DC

## 2015-04-26 MED ORDER — PHENOL 1.4 % MT LIQD: 1.0000 | OROMUCOSAL | Status: DC | PRN

## 2015-04-26 MED ORDER — RALOXIFENE HCL 60 MG PO TABS
60.0000 mg | ORAL_TABLET | Freq: Every day | ORAL | Status: DC
  Administered 2015-04-26 – 2015-04-29 (×4): 60 mg via ORAL
  Filled 2015-04-26 (×4): qty 1

## 2015-04-26 MED ORDER — ONDANSETRON HCL 4 MG/2ML IJ SOLN
4.0000 mg | Freq: Four times a day (QID) | INTRAMUSCULAR | Status: DC | PRN
Start: 2015-04-26 — End: 2015-04-29
  Administered 2015-04-26 – 2015-04-28 (×4): 4 mg via INTRAVENOUS
  Filled 2015-04-26 (×4): qty 2

## 2015-04-26 MED ORDER — POLYETHYLENE GLYCOL 3350 17 G PO PACK: 17.0000 g | PACK | Freq: Every day | ORAL | Status: DC | PRN

## 2015-04-26 MED ORDER — VANCOMYCIN HCL IN DEXTROSE 1-5 GM/200ML-% IV SOLN
1000.0000 mg | Freq: Once | INTRAVENOUS | Status: AC
  Administered 2015-04-26: 1000 mg via INTRAVENOUS

## 2015-04-26 MED ORDER — VITAMIN B-2 25 MG PO TABS: ORAL_TABLET | Freq: Every day | ORAL | Status: DC

## 2015-04-26 MED ORDER — NON FORMULARY: 40.0000 mg | Freq: Two times a day (BID) | Status: DC

## 2015-04-26 MED ORDER — BUPIVACAINE-EPINEPHRINE (PF) 0.5% -1:200000 IJ SOLN
INTRAMUSCULAR | Status: AC
  Filled 2015-04-26: qty 30

## 2015-04-26 MED ORDER — NEOSTIGMINE METHYLSULFATE 10 MG/10ML IV SOLN
INTRAVENOUS | Status: DC | PRN
  Administered 2015-04-26: 4.5 mg via INTRAVENOUS

## 2015-04-26 MED ORDER — ACETAMINOPHEN 10 MG/ML IV SOLN
INTRAVENOUS | Status: DC | PRN
  Administered 2015-04-26: 1000 mg via INTRAVENOUS

## 2015-04-26 MED ORDER — LIDOCAINE 5 % EX PTCH
1.0000 | MEDICATED_PATCH | CUTANEOUS | Status: DC
  Administered 2015-04-26 – 2015-04-28 (×3): 1 via TRANSDERMAL
  Filled 2015-04-26 (×4): qty 1

## 2015-04-26 MED ORDER — FAMOTIDINE 20 MG PO TABS
ORAL_TABLET | ORAL | Status: AC
  Filled 2015-04-26: qty 1

## 2015-04-26 MED ORDER — VANCOMYCIN HCL IN DEXTROSE 1-5 GM/200ML-% IV SOLN
1000.0000 mg | Freq: Two times a day (BID) | INTRAVENOUS | Status: AC
  Administered 2015-04-26: 1000 mg via INTRAVENOUS
  Filled 2015-04-26: qty 200

## 2015-04-26 MED ORDER — PROPOFOL 10 MG/ML IV BOLUS
INTRAVENOUS | Status: DC | PRN
  Administered 2015-04-26: 140 mg via INTRAVENOUS

## 2015-04-26 MED ORDER — LACTATED RINGERS IV SOLN
INTRAVENOUS | Status: DC
  Administered 2015-04-26 (×2): via INTRAVENOUS

## 2015-04-26 MED ORDER — ONDANSETRON HCL 4 MG/2ML IJ SOLN
INTRAMUSCULAR | Status: DC | PRN
  Administered 2015-04-26: 4 mg via INTRAVENOUS

## 2015-04-26 MED ORDER — NEOMYCIN-POLYMYXIN B GU 40-200000 IR SOLN
Status: DC | PRN
  Administered 2015-04-26: 16 mL

## 2015-04-26 MED ORDER — CIPROFLOXACIN IN D5W 400 MG/200ML IV SOLN
INTRAVENOUS | Status: DC | PRN
  Administered 2015-04-26: 400 mg via INTRAVENOUS

## 2015-04-26 MED ORDER — CIPROFLOXACIN IN D5W 400 MG/200ML IV SOLN
400.0000 mg | Freq: Two times a day (BID) | INTRAVENOUS | Status: DC
  Administered 2015-04-26: 400 mg via INTRAVENOUS
  Filled 2015-04-26: qty 200

## 2015-04-26 MED ORDER — LIDOCAINE HCL (CARDIAC) 20 MG/ML IV SOLN
INTRAVENOUS | Status: DC | PRN
  Administered 2015-04-26: 40 mg via INTRAVENOUS

## 2015-04-26 MED ORDER — VANCOMYCIN HCL IN DEXTROSE 1-5 GM/200ML-% IV SOLN
INTRAVENOUS | Status: AC
Start: 2015-04-26 — End: 2015-04-26
  Administered 2015-04-26: 1000 mg via INTRAVENOUS
  Filled 2015-04-26: qty 200

## 2015-04-26 MED ORDER — CITALOPRAM HYDROBROMIDE 20 MG PO TABS
20.0000 mg | ORAL_TABLET | Freq: Two times a day (BID) | ORAL | Status: DC
  Administered 2015-04-26 – 2015-04-29 (×6): 20 mg via ORAL
  Filled 2015-04-26 (×6): qty 1

## 2015-04-26 MED ORDER — MEPERIDINE HCL 25 MG/ML IJ SOLN
25.0000 mg | INTRAMUSCULAR | Status: DC | PRN
  Administered 2015-04-26: 25 mg via INTRAVENOUS

## 2015-04-26 MED ORDER — LISINOPRIL 20 MG PO TABS
20.0000 mg | ORAL_TABLET | Freq: Two times a day (BID) | ORAL | Status: DC
Start: 2015-04-26 — End: 2015-04-29
  Administered 2015-04-26 – 2015-04-28 (×5): 20 mg via ORAL
  Filled 2015-04-26 (×5): qty 1

## 2015-04-26 MED ORDER — VITAMIN B-12 1000 MCG PO TABS
1000.0000 ug | ORAL_TABLET | Freq: Every day | ORAL | Status: DC
Start: 2015-04-26 — End: 2015-04-29
  Administered 2015-04-27 – 2015-04-29 (×3): 1000 ug via ORAL
  Filled 2015-04-26 (×3): qty 1

## 2015-04-26 MED ORDER — AMLODIPINE BESYLATE 5 MG PO TABS
5.0000 mg | ORAL_TABLET | Freq: Every day | ORAL | Status: DC
  Administered 2015-04-27: 5 mg via ORAL
  Filled 2015-04-26 (×2): qty 1

## 2015-04-26 MED ORDER — MEPERIDINE HCL 25 MG/ML IJ SOLN
INTRAMUSCULAR | Status: AC
  Filled 2015-04-26: qty 1

## 2015-04-26 MED ORDER — LANSOPRAZOLE 30 MG PO CPDR
30.0000 mg | DELAYED_RELEASE_CAPSULE | Freq: Two times a day (BID) | ORAL | Status: DC
  Administered 2015-04-27 – 2015-04-29 (×5): 30 mg via ORAL
  Filled 2015-04-26 (×9): qty 1

## 2015-04-26 MED ORDER — GLYCOPYRROLATE 0.2 MG/ML IJ SOLN
INTRAMUSCULAR | Status: DC | PRN
  Administered 2015-04-26: .6 mg via INTRAVENOUS

## 2015-04-26 MED ORDER — MEPERIDINE HCL 25 MG/ML IJ SOLN
25.0000 mg | INTRAMUSCULAR | Status: DC | PRN
  Administered 2015-04-26: 50 mg via INTRAVENOUS
  Administered 2015-04-26: 25 mg via INTRAVENOUS
  Administered 2015-04-26 – 2015-04-27 (×7): 50 mg via INTRAVENOUS
  Administered 2015-04-27: 25 mg via INTRAVENOUS
  Administered 2015-04-28: 50 mg via INTRAVENOUS
  Administered 2015-04-28 (×2): 25 mg via INTRAVENOUS
  Filled 2015-04-26: qty 1
  Filled 2015-04-26 (×3): qty 2
  Filled 2015-04-26 (×2): qty 1
  Filled 2015-04-26: qty 2
  Filled 2015-04-26: qty 1
  Filled 2015-04-26 (×5): qty 2

## 2015-04-26 MED ORDER — RISAQUAD PO CAPS
1.0000 | ORAL_CAPSULE | Freq: Every day | ORAL | Status: DC
  Administered 2015-04-27 – 2015-04-29 (×3): 1 via ORAL
  Filled 2015-04-26 (×3): qty 1

## 2015-04-26 MED ORDER — MENTHOL 3 MG MT LOZG: 1.0000 | LOZENGE | OROMUCOSAL | Status: DC | PRN

## 2015-04-26 SURGICAL SUPPLY — 63 items
BLADE SAGITTAL AGGR TOOTH XLG (BLADE) ×3 IMPLANT
BLADE SAW 1/2 (BLADE) ×3 IMPLANT
BLADE SAW SAG 29X58X.64 (BLADE) ×3 IMPLANT
BLADE SURG 15 STRL LF DISP TIS (BLADE) ×1 IMPLANT
BLADE SURG 15 STRL SS (BLADE) ×2
BNDG COHESIVE 6X5 TAN STRL LF (GAUZE/BANDAGES/DRESSINGS) ×3 IMPLANT
BOWL CEMENT MIXING ADV NOZZLE (MISCELLANEOUS) IMPLANT
CANISTER SUCT 1200ML W/VALVE (MISCELLANEOUS) ×3 IMPLANT
CANISTER SUCT 3000ML (MISCELLANEOUS) ×6 IMPLANT
CAPT KNEE TRIATH TK-4 ×3 IMPLANT
CATH TRAY METER 16FR LF (MISCELLANEOUS) ×3 IMPLANT
CHLORAPREP W/TINT 26ML (MISCELLANEOUS) ×9 IMPLANT
COOLER POLAR GLACIER W/PUMP (MISCELLANEOUS) ×3 IMPLANT
DECANTER SPIKE VIAL GLASS SM (MISCELLANEOUS) ×9 IMPLANT
DRAPE INCISE IOBAN 66X45 STRL (DRAPES) ×3 IMPLANT
DRAPE SHEET LG 3/4 BI-LAMINATE (DRAPES) ×3 IMPLANT
DRSG AQUACEL AG ADV 3.5X10 (GAUZE/BANDAGES/DRESSINGS) IMPLANT
DRSG OPSITE POSTOP 4X10 (GAUZE/BANDAGES/DRESSINGS) IMPLANT
GAUZE PETRO XEROFOAM 1X8 (MISCELLANEOUS) IMPLANT
GAUZE SPONGE 4X4 12PLY STRL (GAUZE/BANDAGES/DRESSINGS) ×3 IMPLANT
GLOVE BIO SURGEON STRL SZ8 (GLOVE) ×12 IMPLANT
GLOVE BIOGEL M STRL SZ7.5 (GLOVE) IMPLANT
GLOVE BIOGEL PI IND STRL 7.5 (GLOVE) ×4 IMPLANT
GLOVE BIOGEL PI IND STRL 8 (GLOVE) ×1 IMPLANT
GLOVE BIOGEL PI INDICATOR 7.5 (GLOVE) ×8
GLOVE BIOGEL PI INDICATOR 8 (GLOVE) ×2
GLOVE PI ULTRA LF STRL 7.5 (GLOVE) ×2 IMPLANT
GLOVE PI ULTRA NON LATEX 7.5 (GLOVE) ×4
GOWN STRL REUS W/ TWL LRG LVL3 (GOWN DISPOSABLE) ×2 IMPLANT
GOWN STRL REUS W/TWL LRG LVL3 (GOWN DISPOSABLE) ×4
GOWN STRL REUS W/TWL LRG LVL4 (GOWN DISPOSABLE) ×6 IMPLANT
HANDPIECE SUCTION TUBG SURGILV (MISCELLANEOUS) ×3 IMPLANT
HOOD PEEL AWAY FACE SHEILD DIS (HOOD) ×6 IMPLANT
IRRIGATION STRYKERFLOW (MISCELLANEOUS) ×1 IMPLANT
IRRIGATOR STRYKERFLOW (MISCELLANEOUS) ×3
KIT RM TURNOVER STRD PROC AR (KITS) ×3 IMPLANT
NDL SAFETY 18GX1.5 (NEEDLE) ×3 IMPLANT
NDL SAFETY 22GX1.5 (NEEDLE) IMPLANT
NDL SAFETY ECLIPSE 18X1.5 (NEEDLE) ×1 IMPLANT
NEEDLE HYPO 18GX1.5 SHARP (NEEDLE) ×2
NEEDLE SPNL 18GX3.5 QUINCKE PK (NEEDLE) ×3 IMPLANT
NEEDLE SPNL 20GX3.5 QUINCKE YW (NEEDLE) ×3 IMPLANT
NS IRRIG 1000ML POUR BTL (IV SOLUTION) ×3 IMPLANT
PACK TOTAL KNEE (MISCELLANEOUS) ×3 IMPLANT
PAD ABD DERMACEA PRESS 5X9 (GAUZE/BANDAGES/DRESSINGS) IMPLANT
PAD GROUND ADULT SPLIT (MISCELLANEOUS) ×3 IMPLANT
PAD WRAPON POLAR KNEE (MISCELLANEOUS) ×1 IMPLANT
SOL .9 NS 3000ML IRR  AL (IV SOLUTION) ×2
SOL .9 NS 3000ML IRR UROMATIC (IV SOLUTION) ×1 IMPLANT
SOL PREP PVP 2OZ (MISCELLANEOUS) ×3
SOLUTION PREP PVP 2OZ (MISCELLANEOUS) ×1 IMPLANT
STAPLER SKIN PROX 35W (STAPLE) ×3 IMPLANT
SUCTION FRAZIER TIP 10 FR DISP (SUCTIONS) ×3 IMPLANT
SUT ETHIBOND NAB CT1 #1 30IN (SUTURE) ×3 IMPLANT
SUT VIC AB 0 CT1 36 (SUTURE) ×6 IMPLANT
SUT VIC AB 2-0 CT1 27 (SUTURE) ×4
SUT VIC AB 2-0 CT1 TAPERPNT 27 (SUTURE) ×2 IMPLANT
SYR 20CC LL (SYRINGE) ×3 IMPLANT
SYR 30ML LL (SYRINGE) ×3 IMPLANT
SYR 50ML LL SCALE MARK (SYRINGE) ×3 IMPLANT
SYRINGE 10CC LL (SYRINGE) ×3 IMPLANT
WATER STERILE IRR 1000ML POUR (IV SOLUTION) IMPLANT
WRAPON POLAR PAD KNEE (MISCELLANEOUS) ×3

## 2015-04-26 NOTE — Anesthesia Procedure Notes (Signed)
Procedure Name: Intubation Date/Time: 04/26/2015 8:00 AM Performed by: Allean Found Pre-anesthesia Checklist: Patient identified, Emergency Drugs available, Suction available, Patient being monitored and Timeout performed Patient Re-evaluated:Patient Re-evaluated prior to inductionOxygen Delivery Method: Circle system utilized Preoxygenation: Pre-oxygenation with 100% oxygen Intubation Type: IV induction Ventilation: Mask ventilation without difficulty Laryngoscope Size: Mac and 3 Grade View: Grade I Tube type: Oral Number of attempts: 1 Placement Confirmation: ETT inserted through vocal cords under direct vision,  positive ETCO2 and breath sounds checked- equal and bilateral Secured at: 21 cm Tube secured with: Tape Dental Injury: Teeth and Oropharynx as per pre-operative assessment

## 2015-04-26 NOTE — Anesthesia Preprocedure Evaluation (Signed)
Anesthesia Evaluation  Patient identified by MRN, date of birth, ID band Patient awake    Reviewed: Allergy & Precautions, NPO status , Patient's Chart, lab work & pertinent test results  History of Anesthesia Complications (+) PONV  Airway Mallampati: II  TM Distance: >3 FB Neck ROM: Limited    Dental  (+) Teeth Intact   Pulmonary shortness of breath and with exertion, former smoker,    Pulmonary exam normal        Cardiovascular Exercise Tolerance: Poor hypertension, Pt. on medications and Pt. on home beta blockers + CAD  Normal cardiovascular exam     Neuro/Psych CVA    GI/Hepatic GERD  Medicated and Controlled,  Endo/Other  Hypothyroidism Morbid obesity  Renal/GU      Musculoskeletal  (+) Arthritis , Osteoarthritis,    Abdominal (+) + obese,  Abdomen: soft.    Peds  Hematology Hb 12.5.   Anesthesia Other Findings   Reproductive/Obstetrics                             Anesthesia Physical Anesthesia Plan  ASA: III  Anesthesia Plan: General   Post-op Pain Management:    Induction: Intravenous  Airway Management Planned: Oral ETT  Additional Equipment:   Intra-op Plan:   Post-operative Plan: Extubation in OR  Informed Consent:   Plan Discussed with: CRNA  Anesthesia Plan Comments:         Anesthesia Quick Evaluation

## 2015-04-26 NOTE — Progress Notes (Signed)
PHARMACIST - PHYSICIAN ORDER COMMUNICATION  CONCERNING: P&T Medication Policy on Herbal Medications  DESCRIPTION:  This patient's order for:  Co Q-10, Evening Primrose, Vit B-2 and Biotin  has been noted.  This product(s) is classified as an "herbal" or natural product. Due to a lack of definitive safety studies or FDA approval, nonstandard manufacturing practices, plus the potential risk of unknown drug-drug interactions while on inpatient medications, the Pharmacy and Therapeutics Committee does not permit the use of "herbal" or natural products of this type within [facility].   ACTION TAKEN: The pharmacy department is unable to verify this order at this time and your patient has been informed of this safety policy. Please reevaluate patient's clinical condition at discharge and address if the herbal or natural product(s) should be resumed at that time.

## 2015-04-26 NOTE — Transfer of Care (Signed)
Immediate Anesthesia Transfer of Care Note  Patient: Rachel Shaffer  Procedure(s) Performed: Procedure(s): TOTAL KNEE ARTHROPLASTY (Right)  Patient Location: PACU  Anesthesia Type:General  Level of Consciousness: awake and alert   Airway & Oxygen Therapy: Patient Spontanous Breathing and Patient connected to face mask oxygen  Post-op Assessment: Report given to RN and Post -op Vital signs reviewed and stable  Post vital signs: Reviewed and stable  Last Vitals:  Filed Vitals:   04/26/15 1114  BP: 153/86  Pulse: 73  Temp: 36.2 C  Resp: 15    Complications: No apparent anesthesia complications

## 2015-04-26 NOTE — Evaluation (Signed)
Physical Therapy Evaluation Patient Details Name: Rachel Shaffer MRN: [id number] DOB: Sep 16, 1950 Today's Date: 04/26/2015   History of Present Illness  Pt is a 64 yo female admitted to the hospital s/p R TKA on 04/26/15  Clinical Impression  Pt presents with hx of stroke, CAD, asthma, DDD, depression, and SOB. Examination reveals that pt performs bed mobility at mod assist but is not able to perform transfers on ambulation this date secondary to pain. Even with encouragement and sliding transfer options being presented, pt still wishes to only dangle in bed and perform bed exercises. She displays poor strength in RLE (2-/5 gross MMT), and decreased ROM. She will continue to benefit from skilled PT in order for her to eventually return home safely. Pt is fairly pleasant to work with, and participates in tasks to her tolerance.     Follow Up Recommendations SNF    Equipment Recommendations  None recommended by PT    Recommendations for Other Services       Precautions / Restrictions Precautions Precautions: Knee;Fall Precaution Booklet Issued: No Restrictions Weight Bearing Restrictions: Yes Other Position/Activity Restrictions: WBAT      Mobility  Bed Mobility Overal bed mobility: Needs Assistance Bed Mobility: Supine to Sit     Supine to sit: Mod assist     General bed mobility comments: Pt performs bed mobility with assist for trunk and for RLE guidance down to the ground. Pt needs lots of encouragement during mobility as she can be dramatic secondary to the pain.   Transfers                 General transfer comment: Pt not willing to perform transfer secondary to pain. Pt emotional at the thought of standing on RLE.   Ambulation/Gait             General Gait Details: Not appropriate given pt pain  Stairs            Wheelchair Mobility    Modified Rankin (Stroke Patients Only)       Balance Overall balance assessment: No apparent balance  deficits (not formally assessed)                                           Pertinent Vitals/Pain Pain Assessment: 0-10 Pain Score: 10-Worst pain ever Pain Location: R knee Pain Descriptors / Indicators: Burning Pain Intervention(s): Limited activity within patient's tolerance;Monitored during session;Premedicated before session    Home Living Family/patient expects to be discharged to:: Private residence Living Arrangements: Spouse/significant other Available Help at Discharge: Family;Available 24 hours/day Type of Home: House Home Access:  (handicap accessible )     Home Layout: Two level;Able to live on main level with bedroom/bathroom Home Equipment: Gilford Rile - 2 wheels      Prior Function Level of Independence: Independent with assistive device(s) (Rollator)         Comments: Pt indep with ADLs. Largely performing household ambulation      Hand Dominance        Extremity/Trunk Assessment   Upper Extremity Assessment: Overall WFL for tasks assessed           Lower Extremity Assessment: RLE deficits/detail RLE Deficits / Details: Gross MMT 2-/5 RLE       Communication   Communication: No difficulties  Cognition Arousal/Alertness: Awake/alert Behavior During Therapy: WFL for tasks assessed/performed Overall Cognitive Status: Within Functional  Limits for tasks assessed                      General Comments      Exercises Total Joint Exercises Goniometric ROM: R knee AAROM: 8 - 63 degrees Other Exercises Other Exercises: Pt performed bilateral therex x 10 reps at min assist for facilitation of movement. Exercises included: ankle pumps, quad sets, glute sets, hip abd, SAQ Other Exercises: Pt was dangled at bedside x 8 minutes. Pt reports pain relief with dangling position       Assessment/Plan    PT Assessment Patient needs continued PT services  PT Diagnosis Difficulty walking;Abnormality of gait;Acute pain   PT Problem  List Decreased strength;Decreased range of motion;Decreased activity tolerance;Decreased knowledge of use of DME;Pain  PT Treatment Interventions DME instruction;Gait training;Stair training;Functional mobility training;Therapeutic activities;Therapeutic exercise;Balance training;Neuromuscular re-education   PT Goals (Current goals can be found in the Care Plan section) Acute Rehab PT Goals Patient Stated Goal: to go to rehab PT Goal Formulation: With patient Time For Goal Achievement: 05/10/15 Potential to Achieve Goals: Good    Frequency BID   Barriers to discharge        Co-evaluation               End of Session Equipment Utilized During Treatment: Gait belt Activity Tolerance: Patient limited by pain Patient left: in bed;with call bell/phone within reach;with bed alarm set;with family/visitor present;with SCD's reapplied Nurse Communication: Mobility status         Time: 1440-1503 PT Time Calculation (min) (ACUTE ONLY): 23 min   Charges:   PT Evaluation $Initial PT Evaluation Tier I: 1 Procedure PT Treatments $Therapeutic Exercise: 8-22 mins   PT G CodesJanyth Contes May 24, 2015, 4:34 PM  Janyth Contes, SPT. [phone]

## 2015-04-26 NOTE — Anesthesia Postprocedure Evaluation (Signed)
  Anesthesia Post-op Note  Patient: Rachel Shaffer  Procedure(s) Performed: Procedure(s): TOTAL KNEE ARTHROPLASTY (Right)  Anesthesia type:General  Patient location: PACU  Post pain: Pain level controlled  Post assessment: Post-op Vital signs reviewed, Patient's Cardiovascular Status Stable, Respiratory Function Stable, Patent Airway and No signs of Nausea or vomiting  Post vital signs: Reviewed and stable  Last Vitals:  Filed Vitals:   04/26/15 1159  BP:   Pulse:   Temp: 36.3 C  Resp:     Level of consciousness: awake, alert  and patient cooperative  Complications: No apparent anesthesia complications

## 2015-04-26 NOTE — Op Note (Addendum)
DATE OF SURGERY:  04/26/2015  PATIENT NAME:  KIMMIE BERGGREN   DOB: 09-02-1950  MRN: [id number]  PRE-OPERATIVE DIAGNOSIS: Degenerative arthrosis of the right knee  POST-OPERATIVE DIAGNOSIS:  Degenerative arthrosis of the right knee  PROCEDURE:  Right total knee arthroplasty  SURGEON:  Dr.Bryar Dahms Leeanne Mannan. M.D.  ASSISTANT: Reche Dixon, PA-C     ANESTHESIA:  Gen.  ESTIMATED BLOOD LOSS: 100 mL  TOURNIQUET TIME: 95 minutes   IMPLANTS UTILIZED: Triathlon PS #3 femoral component, size 3 tibial component, size 3  9 mm tibial bearing insert, 10 mm asymmetric patella(A34mm)  INDICATIONS FOR SURGERY: Tonea NIVEA WOJDYLA is a 64 y.o. year old female with a long history of progressive knee pain. X-rays demonstrated severe degenerative changes in tricompartmental fashion. The patient had not seen any significant improvement despite conservative nonsurgical intervention. After discussion of the risks and benefits of surgical intervention, the patient expressed understanding of the risks benefits and agree with plans for total knee arthroplasty.   The risks, benefits, and alternatives were discussed at length including but not limited to the risks of infection, bleeding, nerve injury, stiffness, blood clots, the need for revision surgery, cardiopulmonary complications, among others, and they were willing to proceed.  PROCEDURE:   The patient was taken to the operating room where satisfactory  Gen. anesthesia was achieved. A Foley catheter was inserted. The patient was given IV vancomycin and IV Cipro prior to start of the procedure. A tourniquet was applied to right upper thigh. The right lower extremity was prepped and draped in the usual fashion for a total knee procedure. The right lower extremity was then exsanguinated, and the tourniquet was inflated. A medial parapatella incision was made.. Dissection was carried down through the subcutaneous tissue onto the capsule. This was divided in line with the  incision. The knee was then inspected. There was significant erosion of the medial and lateral compartments.  I went ahead and drilled a hole in the intercondylar notch. Intramedullary rod was inserted. On the rod was the distal femoral cutting block. It was set for  5 degree valgus cut. The cutting block was fixed to the distal femur with smooth pins and then the intramedullary road was removed. The distal femoral cut was made, removing 10 mm of bone. I then went ahead and removed some medial and lateral compartment meniscal remnants. ACL and PCL were excised. The knee was hyperflexed. The femoral sizing guide was placed on the distal femur and was lined up with the epicondylar axis. It was felt that #3 femoral component was going to be the appropriate size. I went ahead and impacted the 4 in 1 cutting guide onto the distal femur. Anterior and posterior cuts were made followed by the anterior and posterior chamfer cuts. The intercondylar area was notched out using the appropriate guide.   External alignment jig was placed on the right leg and ultimately set for a cut for mm below the lateral tibial plateau. The cut was sloped 3 degrees. I went ahead and made this cut without difficulty. I removed the cut bone from the proximal tibial which gave me a flush cut across the tibial surface. I then inserted a  gap spacer with the knee extended, and indeed it was felt that I could easily fit a  9 mm tibial bearing insert into the extension space.  I then sized the proximal tibia for a  #3 base plate. With the #3 femoral component in place and the #3 base plate in place,  I was able to insert a 9 mm trial tibial-bearing insert. With all the trials in place, the knee fully extended and flexed well. At this time I went ahead and drilled 2 holes through the femoral trial for the pegs on the femoral component. I then  resected 4 mm of bone from retropatellar surface. I made peg holes for the A32 patellar implant. Trial  patella was inserted. It tracked well.   The trials were removed. The tibial base plate was positioned appropriately for the proximal tibial cruciate cut. The proximal tibial cruciate cut was made.    I then sequentially impacted the porous-coated components. The #3  tibial base place was impacted onto the proximal tibia.  I then impacted the #3 femoral component onto the distal.. I then inserted a trial  9 mm spacer. The knee was extended.  I went ahead and impacted a 10 mm thick asymmetric patella onto the retropatellar.  I then went ahead and removed the trial tibial spacer and inserted a permanent 9 mm tibial spacer.  The knee came to full extension and flexed well. The knee seemed to be reasonably stable.   Tourniquet was released at this time.  Tourniquet was up for about 95 minutes.  Bleeding was controlled with coagulation cautery. The wound was irrigated with GU irrigant.  I also injected the capsule and subcutaneous tissue with about 60 cc of Exparel and saline mixture. I also injected the same tissues with about 30 cc of 0.5% Marcaine with epinephrine. I went ahead and closed the capsule with #1 and #0 ethibond sutures. After the capsule was closed I injected the knee joint with 10 cc of tranexamic acid. The subcutaneous was then closed with 3-0 and 2-0 Vicryl, and skin with skin staples. Subsequent to the closure, I did go ahead and apply a sterile dressing and 4 TENS pads. Polar Care cooling pad was applied.  The patient was then transferred to a stretcher bed and taken to the recovery room in satisfactory condition. Blood loss was about 100 cc.        Dr.Moua Rasmusson Leeanne Mannan. M.D.

## 2015-04-26 NOTE — Evaluation (Signed)
Physical Therapy Evaluation Patient Details Name: Rachel Shaffer MRN: [id number] DOB: 12-29-1950 Today's Date: 04/26/2015   History of Present Illness  Pt is a 64 yo female admitted to the hospital s/p R TKA on 04/26/15  Clinical Impression  Pt presents with hx of stroke, CAD, asthma, DDD, depression, and SOB. Examination reveals that pt performs bed mobility at mod assist but is not able to perform transfers on ambulation this date secondary to pain. Even with encouragement and sliding transfer options being presented, pt still wishes to only dangle in bed and perform bed exercises. She displays poor strength in RLE (2-/5 gross MMT), and decreased ROM. She will continue to benefit from skilled PT in order for her to eventually return home safely. Pt is fairly pleasant to work with, and participates in tasks to her tolerance.     Follow Up Recommendations SNF    Equipment Recommendations  None recommended by PT    Recommendations for Other Services       Precautions / Restrictions Precautions Precautions: Knee;Fall Precaution Booklet Issued: No Restrictions Weight Bearing Restrictions: Yes Other Position/Activity Restrictions: WBAT      Mobility  Bed Mobility Overal bed mobility: Needs Assistance Bed Mobility: Supine to Sit     Supine to sit: Mod assist     General bed mobility comments: Pt performs bed mobility with assist for trunk and for RLE guidance down to the ground. Pt needs lots of encouragement during mobility as she can be dramatic secondary to the pain.   Transfers                 General transfer comment: Pt not willing to perform transfer secondary to pain. Pt emotional at the thought of standing on RLE.   Ambulation/Gait             General Gait Details: Not appropriate given pt pain  Stairs            Wheelchair Mobility    Modified Rankin (Stroke Patients Only)       Balance Overall balance assessment: No apparent balance  deficits (not formally assessed)                                           Pertinent Vitals/Pain Pain Assessment: 0-10 Pain Score: 10-Worst pain ever Pain Location: R knee Pain Descriptors / Indicators: Burning Pain Intervention(s): Limited activity within patient's tolerance;Monitored during session;Premedicated before session    Home Living Family/patient expects to be discharged to:: Private residence Living Arrangements: Spouse/significant other Available Help at Discharge: Family;Available 24 hours/day Type of Home: House Home Access:  (handicap accessible )     Home Layout: Two level;Able to live on main level with bedroom/bathroom Home Equipment: Gilford Rile - 2 wheels      Prior Function Level of Independence: Independent with assistive device(s) (Rollator)         Comments: Pt indep with ADLs. Largely performing household ambulation      Hand Dominance        Extremity/Trunk Assessment   Upper Extremity Assessment: Overall WFL for tasks assessed           Lower Extremity Assessment: RLE deficits/detail RLE Deficits / Details: Gross MMT 2-/5 RLE       Communication   Communication: No difficulties  Cognition Arousal/Alertness: Awake/alert Behavior During Therapy: WFL for tasks assessed/performed Overall Cognitive Status: Within Functional  Limits for tasks assessed                      General Comments      Exercises Total Joint Exercises Goniometric ROM: R knee AAROM: 8 - 63 degrees Other Exercises Other Exercises: Pt performed bilateral therex x 10 reps at min assist for facilitation of movement. Exercises included: ankle pumps, quad sets, glute sets, hip abd, SAQ Other Exercises: Pt was dangled at bedside x 8 minutes. Pt reports pain relief with dangling position       Assessment/Plan    PT Assessment Patient needs continued PT services  PT Diagnosis Difficulty walking;Abnormality of gait;Acute pain   PT Problem  List Decreased strength;Decreased range of motion;Decreased activity tolerance;Decreased knowledge of use of DME;Pain  PT Treatment Interventions DME instruction;Gait training;Stair training;Functional mobility training;Therapeutic activities;Therapeutic exercise;Balance training;Neuromuscular re-education   PT Goals (Current goals can be found in the Care Plan section) Acute Rehab PT Goals Patient Stated Goal: to go to rehab PT Goal Formulation: With patient Time For Goal Achievement: 05/10/15 Potential to Achieve Goals: Good    Frequency BID   Barriers to discharge        Co-evaluation               End of Session Equipment Utilized During Treatment: Gait belt Activity Tolerance: Patient limited by pain Patient left: in bed;with call bell/phone within reach;with bed alarm set;with family/visitor present;with SCD's reapplied Nurse Communication: Mobility status         Time: 1440-1503 PT Time Calculation (min) (ACUTE ONLY): 23 min   Charges:         PT G CodesJanyth Contes 2015/05/01, 4:20 PM  Janyth Contes, SPT. [phone]

## 2015-04-26 NOTE — H&P (Signed)
  H and P reviewed. No changes. Uploaded at later date. 

## 2015-04-27 LAB — BASIC METABOLIC PANEL
Anion gap: 6 (ref 5–15)
BUN: 12 mg/dL (ref 6–20)
CHLORIDE: 93 mmol/L — AB (ref 101–111)
CO2: 23 mmol/L (ref 22–32)
Calcium: 7.8 mg/dL — ABNORMAL LOW (ref 8.9–10.3)
Creatinine, Ser: 0.98 mg/dL (ref 0.44–1.00)
GFR calc Af Amer: >60 mL/min (ref >60)
GFR calc non Af Amer: 59 mL/min — ABNORMAL LOW (ref >60)
Glucose, Bld: 139 mg/dL — ABNORMAL HIGH (ref 65–99)
POTASSIUM: 3.8 mmol/L (ref 3.5–5.1)
Sodium: 122 mmol/L — ABNORMAL LOW (ref 135–145)

## 2015-04-27 NOTE — Clinical Social Work Note (Signed)
Clinical Social Work Assessment  Patient Details  Name: Rachel Shaffer MRN: [id number] Date of Birth: 12-08-1950  Date of referral:  04/27/15               Reason for consult:  Facility Placement                Permission sought to share information with:  Chartered certified accountant granted to share information::  Yes, Verbal Permission Granted  Name::      [place]::   [facility]   Relationship::     Contact Information:     Housing/Transportation Living arrangements for the past 2 months:  [place] of Information:  Patient, Spouse Patient Interpreter Needed:  None Criminal Activity/Legal Involvement Pertinent to Current Situation/Hospitalization:  No - Comment as needed Significant Relationships:  Adult Children, Spouse Lives with:  Spouse Do you feel safe going back to the place where you live?  Yes Need for family participation in patient care:  Yes (Comment)  Care giving concerns: Patient lives with her husband Rachel Shaffer in [place].    Social Worker assessment / plan:  Holiday representative (CSW) met with patient and her husband and 2 friends were at bedside. Patient was sitting in the chair and was alert and oriented. Patient is familiar to CSW from [facility] [employer]. CSW introduced self and explained role of CSW department. Patient reported that she lives in [place] with her husband and plans on going to [facility] for rehab. Patient reported that she has talked to [facility] admissions coordinator at [facility] prior to surgery.   [facility] made a bed offer pending availability. Per Seth Bake at [facility] if any campus resident needs the bed it will have to given to them. CSW made patient aware of above. Patient and husband verbalized their understanding. FL2 complete and on chart.   Employment status:  Retired Nurse, adult PT Recommendations:  [place] / Referral to  community resources:  [place]  Patient/Family's Response to care: Patient and husband are agreeable to rehab.   Patient/Family's Understanding of and Emotional Response to Diagnosis, Current Treatment, and Prognosis: Patient was pleasant throughout assessment and thanked CSW for visit.   Emotional Assessment Appearance:  Appears stated age Attitude/Demeanor/Rapport:    Affect (typically observed):  Accepting, Adaptable, Pleasant Orientation:  Oriented to Self, Oriented to Place, Oriented to  Time, Oriented to Situation Alcohol / Substance use:  Not Applicable Psych involvement (Current and /or in the community):  No (Comment)  Discharge Needs  Concerns to be addressed:  Discharge Planning Concerns Readmission within the last 30 days:  No Current discharge risk:  Dependent with Mobility Barriers to Discharge:  Continued Medical Work up   Loralyn Freshwater, LCSW 04/27/2015, 2:17 PM

## 2015-04-27 NOTE — Progress Notes (Signed)
Rachel Shaffer states that she cannot take protonix because it causes an allergic reaction that feels like her throat is closing, states she takes prevacid twice daily. Also states that she takes 4mg  lorazepam at bedtime. MD notified and orders placed.

## 2015-04-27 NOTE — Care Management (Signed)
Met with patient. She wants to go to [facility] for SNF. CSW following. PT is recommending SNF.

## 2015-04-27 NOTE — Progress Notes (Signed)
Physical Therapy Treatment Patient Details Name: Rachel Shaffer MRN: [id number] DOB: 1951-04-12 Today's Date: 04/27/2015    History of Present Illness Pt is a 64 yo female admitted to the hospital s/p R TKA on 04/26/15    PT Comments    Pt limited by pain this afternoon. She is not willing to try any OOB mobility on account of just having been up to get back into bed from the chair, and is currently in extreme pain. She is willing to perform bed exercises with therapy, and agrees that tomorrow morning she will attempt to ambulate further if the pain is better managed. Due to her strength, ROM, and gait deficits, she will continue to benefit from skilled PT in order for her to eventually return home safely.  Follow Up Recommendations  SNF     Equipment Recommendations  None recommended by PT    Recommendations for Other Services       Precautions / Restrictions Precautions Precautions: Knee;Fall Precaution Booklet Issued: No Restrictions Weight Bearing Restrictions: Yes RLE Weight Bearing: Weight bearing as tolerated Other Position/Activity Restrictions: WBAT    Mobility  Bed Mobility Overal bed mobility:  (not assessed this afternoon)  Transfers Overall transfer level:  (not appropriate secondary to pain. Pt up with nursing prior) Equipment used: Rolling walker (2 wheeled)          Ambulation/Gait Ambulation/Gait assistance:  (not appropriate)   Stairs            Wheelchair Mobility    Modified Rankin (Stroke Patients Only)       Balance Overall balance assessment: No apparent balance deficits (not formally assessed)                                  Cognition Arousal/Alertness: Awake/alert Behavior During Therapy: WFL for tasks assessed/performed Overall Cognitive Status: Within Functional Limits for tasks assessed                      Exercises Total Joint Exercises Goniometric ROM: R knee AAROM: 6 - 69 degrees  Other  Exercises Other Exercises: Pt performed bilateral therex x 12 reps at min assist for facilitation of movement. Exercises included: ankle pumps, quad sets, glute sets, hip abd, SLR, SAQ    General Comments        Pertinent Vitals/Pain Pain Assessment: 0-10 Pain Score: 9  Pain Location: R knee Pain Intervention(s): Limited activity within patient's tolerance;Monitored during session;Premedicated before session;Ice applied    Home Living                      Prior Function            PT Goals (current goals can now be found in the care plan section) Acute Rehab PT Goals Patient Stated Goal: to perform bed exercise  PT Goal Formulation: With patient Time For Goal Achievement: 05/10/15 Potential to Achieve Goals: Good Progress towards PT goals: Progressing toward goals    Frequency  BID    PT Plan Current plan remains appropriate    Co-evaluation             End of Session Equipment Utilized During Treatment: Gait belt Activity Tolerance:  (pt limited by nausea ) Patient left: in bed;with bed alarm set;with call bell/phone within reach;with SCD's reapplied;with family/visitor present     Time: 3276-1470 PT Time Calculation (min) (ACUTE ONLY): 13 min  Charges:  $Gait Training: 8-22 mins $Therapeutic Exercise: 8-22 mins                    G Codes:      Janyth Contes 05-15-2015, 4:53 PM  Janyth Contes, SPT. [phone]

## 2015-04-27 NOTE — Clinical Social Work Placement (Signed)
   CLINICAL SOCIAL WORK PLACEMENT  NOTE  Date:  04/27/2015  Patient Details  Name: Rachel Shaffer MRN: [id number] Date of Birth: May 26, 1951  Clinical Social Work is seeking post-discharge placement for this patient at the [place] level of care (*CSW will initial, date and re-position this form in  chart as items are completed):  Yes   Patient/family provided with [place] Work Department's list of facilities offering this level of care within the geographic area requested by the patient (or if unable, by the patient's family).  Yes   Patient/family informed of their freedom to choose among providers that offer the needed level of care, that participate in Medicare, Medicaid or managed care program needed by the patient, have an available bed and are willing to accept the patient.  Yes   Patient/family informed of [facility]'s ownership interest in [facility] and [facility], as well as of the fact that they are under no obligation to receive care at these facilities.  PASRR submitted to EDS on 04/27/15     PASRR number received on 04/27/15     Existing PASRR number confirmed on       FL2 transmitted to all facilities in geographic area requested by pt/family on 04/27/15     FL2 transmitted to all facilities within larger geographic area on       Patient informed that his/her managed care company has contracts with or will negotiate with certain facilities, including the following:        Yes   Patient/family informed of bed offers received.  Patient chooses bed at  [facility] )     Physician recommends and patient chooses bed at      Patient to be transferred to   on  .  Patient to be transferred to facility by       Patient family notified on   of transfer.  Name of family member notified:        PHYSICIAN Please sign FL2     Additional Comment:    _______________________________________________ Loralyn Freshwater,  LCSW 04/27/2015, 12:20 PM

## 2015-04-27 NOTE — Progress Notes (Deleted)
Physical Therapy Treatment Patient Details Name: Rachel Shaffer MRN: [id number] DOB: 27-Jul-1950 Today's Date: 04/27/2015    History of Present Illness Pt is a 64 yo female admitted to the hospital s/p R TKA on 04/26/15    PT Comments    Pt limited by pain this afternoon. She is not willing to try any OOB mobility on account of just having been up to get back into bed from the chair, and is currently in extreme pain. She is willing to perform bed exercises with therapy, and agrees that tomorrow morning she will attempt to ambulate further if the pain is better managed. Due to her strength, ROM, and gait deficits, she will continue to benefit from skilled PT in order for her to eventually return home safely.  Follow Up Recommendations  SNF     Equipment Recommendations  None recommended by PT    Recommendations for Other Services       Precautions / Restrictions Precautions Precautions: Knee;Fall Precaution Booklet Issued: No Restrictions Weight Bearing Restrictions: Yes RLE Weight Bearing: Weight bearing as tolerated Other Position/Activity Restrictions: WBAT    Mobility  Bed Mobility Overal bed mobility:  (not assessed this afternoon) Bed Mobility: Supine to Sit     Supine to sit: Max assist;+2 for physical assistance     General bed mobility comments: Pt requires assist for trunk and LEs. Pt with lots of c/o pain and continues to need encouragement and redirection as she tends to talk about things that are not directly related to therapy tasks, but appear to be more like excuses for why she can't perform a task.   Transfers   Pt states she just got back to bed with nursing and not wishing to stand up again due to extreme pain.  Ambulation/Gait   Not appropriate   Stairs            Wheelchair Mobility    Modified Rankin (Stroke Patients Only)       Balance Overall balance assessment: No apparent balance deficits (not formally assessed)                                   Cognition Arousal/Alertness: Awake/alert Behavior During Therapy: WFL for tasks assessed/performed Overall Cognitive Status: Within Functional Limits for tasks assessed                      Exercises Total Joint Exercises Goniometric ROM: R knee AAROM: 6 - 69 degrees  Other Exercises Other Exercises: Pt performed bilateral therex x 12 reps at min assist for facilitation of movement. Exercises included: ankle pumps, quad sets, glute sets, hip abd, SLR, SAQ    General Comments        Pertinent Vitals/Pain Pain Assessment: 0-10 Pain Score: 9  Pain Location: R knee Pain Intervention(s): Limited activity within patient's tolerance;Monitored during session;Premedicated before session;Ice applied    Home Living                      Prior Function            PT Goals (current goals can now be found in the care plan section) Acute Rehab PT Goals Patient Stated Goal: to perform bed exercise  PT Goal Formulation: With patient Time For Goal Achievement: 05/10/15 Potential to Achieve Goals: Good Progress towards PT goals: Progressing toward goals    Frequency  BID  PT Plan Current plan remains appropriate    Co-evaluation             End of Session Equipment Utilized During Treatment: Gait belt Activity Tolerance:  (pt limited by nausea ) Patient left: in bed;with bed alarm set;with call bell/phone within reach;with SCD's reapplied;with family/visitor present     Time: 5456-2563 PT Time Calculation (min) (ACUTE ONLY): 13 min  Charges:  $Gait Training: 8-22 mins $Therapeutic Exercise: 8-22 mins                    G CodesJanyth Contes May 06, 2015, 4:42 PM Janyth Contes, SPT. [phone]

## 2015-04-27 NOTE — Evaluation (Signed)
Occupational Therapy Evaluation Patient Details Name: Rachel Shaffer MRN: [id number] DOB: 07/24/1950 Today's Date: 04/27/2015    History of Present Illness Pt is a 64 yo female admitted to the hospital s/p R TKA on 04/26/15   Clinical Impression   This patient is a 64 year old female who came to [facility] for a R total knee replacement.  Patient lives with her husband in a  Home and had been independent with ADL and functional mobility. She now requires assistance and would benefit from Occupational Therapy for ADL/functioal mobility training .      Follow Up Recommendations       Equipment Recommendations       Recommendations for Other Services       Precautions / Restrictions Precautions Precautions: Knee;Fall Precaution Booklet Issued: No Restrictions Weight Bearing Restrictions: Yes RLE Weight Bearing: Weight bearing as tolerated      Mobility Bed Mobility                  Transfers                      Balance                                            ADL                                         General ADL Comments: Patient had been independent. Today practiced techniques for lower body dressing using hip kit with minimal assist and verbal cues for technique. Most likely will not need hip kit in a few days.     Vision     Perception     Praxis      Pertinent Vitals/Pain Pain Score: 9      Hand Dominance     Extremity/Trunk Assessment Upper Extremity Assessment Upper Extremity Assessment: Overall WFL for tasks assessed           Communication Communication Communication: No difficulties   Cognition Arousal/Alertness: Awake/alert Behavior During Therapy: WFL for tasks assessed/performed Overall Cognitive Status: Within Functional Limits for tasks assessed                     General Comments       Exercises       Shoulder Instructions      Home  Living Family/patient expects to be discharged to:: Private residence Living Arrangements: Spouse/significant other Available Help at Discharge: Family;Available 24 hours/day Type of Home: House       Home Layout: Two level;Able to live on main level with bedroom/bathroom               Home Equipment: Walker - 2 wheels          Prior Functioning/Environment Level of Independence: Independent with assistive device(s)             OT Diagnosis: Acute pain   OT Problem List:     OT Treatment/Interventions:      OT Goals(Current goals can be found in the care plan section) Acute Rehab OT Goals Time For Goal Achievement: 05/11/15  OT Frequency:     Barriers to D/C:              Co-evaluation              End of Session Equipment Utilized During Treatment:  (hip kit)  Activity Tolerance:   Patient left: in chair;with call bell/phone within reach;with chair alarm set;with family/visitor present   Time: 0940-0950 OT Time Calculation (min): 10 min Charges:  OT General Charges $OT Visit: 1 Procedure OT Evaluation $Initial OT Evaluation Tier I: 1 Procedure G-Codes:    Huff, John M John M Huff, MS/OTR/L  04/27/2015, 9:59 AM    

## 2015-04-27 NOTE — Plan of Care (Signed)
Dr text to inform of pt's inability to void (urine) since foley removed at 0645 today.  Bladder scan showed (330cc). Order to in/out cath if greater than 600cc. Otherwise staff to push fluids and continue to bladder scan . (Dr. Reche Dixon, PA).

## 2015-04-27 NOTE — Progress Notes (Signed)
  Subjective: 1 Day Post-Op Procedure(s) (LRB): TOTAL KNEE ARTHROPLASTY (Right) Patient reports pain as moderate.   Patient seen in rounds with Dr. Jefm Bryant. Patient is well, and has had no acute complaints or problems Plan is to go Rehab after hospital stay. The patient would like to go to [facility]. Negative for chest pain and shortness of breath Fever: no Gastrointestinal: Negative for nausea and vomiting  Objective: Vital signs in last 24 hours: Temp:  [97.1 F (36.2 C)-98.5 F (36.9 C)] 98.1 F (36.7 C) (10/13 0449) Pulse Rate:  [64-82] 79 (10/13 0451) Resp:  [14-20] 18 (10/13 0449) BP: (118-153)/(48-125) 126/55 mmHg (10/13 0451) SpO2:  [94 %-100 %] 99 % (10/13 0449) Weight:  [108.41 kg (239 lb)] 108.41 kg (239 lb) (10/12 0616)  Intake/Output from previous day:  Intake/Output Summary (Last 24 hours) at 04/27/15 0612 Last data filed at 04/27/15 0108  Gross per 24 hour  Intake   4136 ml  Output   1000 ml  Net   3136 ml    Intake/Output this shift: Total I/O In: 725 [I.V.:525; IV Piggyback:200] Out: 450 [Urine:450]  Labs:  Recent Labs  04/26/15 1327  HGB 11.7*    Recent Labs  04/26/15 1327  WBC 14.2*  RBC 3.92  HCT 36.4  PLT 232    Recent Labs  04/26/15 1327  CREATININE 1.13*   No results for input(s): LABPT, INR in the last 72 hours.   EXAM General - Patient is Alert and Oriented Extremity - Neurovascular intact Dorsiflexion/Plantar flexion intact Compartment soft Dressing/Incision - clean, dry, no drainage Motor Function - intact, moving foot and toes well on exam.   Past Medical History  Diagnosis Date  . Hypertension   . Acid reflux   . Arthritis   . Fibromyalgia   . DJD (degenerative joint disease)   . Sjogren's disease ([place])   . Vitamin D deficiency   . Folic acid deficiency   . Blood transfusion without reported diagnosis   . Osteoporosis   . Stroke ([place])   . Aortic regurgitation   . Cataract   . Anxiety   . Complication  of anesthesia     spinal headache  blood patch  . PONV (postoperative nausea and vomiting)   . Spinal headache   . Family history of adverse reaction to anesthesia     sister scoliosis and trouble with some anesthesia  . Coronary artery disease   . Asthma   . Bronchitis, chronic ([place])   . DDD (degenerative disc disease), cervical     all of back  . Depression   . Pain     non cardiac chest pain  . Anemia   . Hypothyroidism   . Shortness of breath dyspnea     doe    Assessment/Plan: 1 Day Post-Op Procedure(s) (LRB): TOTAL KNEE ARTHROPLASTY (Right) Active Problems:   S/P total knee replacement  Estimated body mass index is 42.35 kg/(m^2) as calculated from the following:   Height as of this encounter: 5\' 3"  (1.6 m).   Weight as of this encounter: 108.41 kg (239 lb). Advance diet Up with therapy Discharge to SNF on Saturday is the plan.  DVT Prophylaxis - Lovenox, Foot Pumps and TED hose Weight-Bearing as tolerated to right leg  Reche Dixon, PA-C Orthopaedic Surgery 04/27/2015, 6:12 AM

## 2015-04-27 NOTE — Progress Notes (Signed)
Physical Therapy Treatment Patient Details Name: Rachel Shaffer MRN: [id number] DOB: 1950/10/24 Today's Date: 04/27/2015    History of Present Illness Pt is a 64 yo female admitted to the hospital s/p R TKA on 04/26/15    PT Comments    Pt limited by pain this afternoon. She is not willing to try any OOB mobility on account of just having been up to get back into bed from the chair, and is currently in extreme pain. She is willing to perform bed exercises with therapy, and agrees that tomorrow morning she will attempt to ambulate further if the pain is better managed. Due to her strength, ROM, and gait deficits, she will continue to benefit from skilled PT in order for her to eventually return home safely.  Follow Up Recommendations  SNF     Equipment Recommendations  None recommended by PT    Recommendations for Other Services       Precautions / Restrictions Precautions Precautions: Knee;Fall Precaution Booklet Issued: No Restrictions Weight Bearing Restrictions: Yes RLE Weight Bearing: Weight bearing as tolerated Other Position/Activity Restrictions: WBAT    Mobility  Bed Mobility Overal bed mobility:  (not assessed this afternoon) Bed Mobility: Supine to Sit     Supine to sit: Max assist;+2 for physical assistance     General bed mobility comments: Pt requires assist for trunk and LEs. Pt with lots of c/o pain and continues to need encouragement and redirection as she tends to talk about things that are not directly related to therapy tasks, but appear to be more like excuses for why she can't perform a task.   Transfers Overall transfer level:  (not appropriate secondary to pain. Pt up with nursing prior) Equipment used: Rolling walker (2 wheeled) Transfers: Sit to/from Stand Sit to Stand: Max assist;+2 physical assistance         General transfer comment: Pt needs heavy assist to get into standing. Needs cues to lift her chest and not slouch over her walker. No  buckling noted in standing  Ambulation/Gait Ambulation/Gait assistance:  (not appropriate) Ambulation Distance (Feet): 3 Feet Assistive device: Rolling walker (2 wheeled) Gait Pattern/deviations: Step-to pattern;Antalgic;Decreased step length - right;Decreased step length - left;Shuffle;Decreased stride length Gait velocity: decreased Gait velocity interpretation: Below normal speed for age/gender General Gait Details: Pt needs assist for RW and occasional support of trunk when slouching. Pt struggles to sequence her gait with her RW and gets flustered with ambulation. Weight shifting to assist swingthrough works slightly    Financial trader Rankin (Stroke Patients Only)       Balance Overall balance assessment: No apparent balance deficits (not formally assessed)                                  Cognition Arousal/Alertness: Awake/alert Behavior During Therapy: WFL for tasks assessed/performed Overall Cognitive Status: Within Functional Limits for tasks assessed                      Exercises Total Joint Exercises Goniometric ROM: R knee AAROM: 6 - 69 degrees  Other Exercises Other Exercises: Pt performed bilateral therex x 12 reps at min assist for facilitation of movement. Exercises included: ankle pumps, quad sets, glute sets, hip abd, SLR, SAQ    General Comments        Pertinent  Vitals/Pain Pain Assessment: 0-10 Pain Score: 9  Pain Location: R knee Pain Intervention(s): Limited activity within patient's tolerance;Monitored during session;Premedicated before session;Ice applied    Home Living                      Prior Function            PT Goals (current goals can now be found in the care plan section) Acute Rehab PT Goals Patient Stated Goal: to perform bed exercise  PT Goal Formulation: With patient Time For Goal Achievement: 05/10/15 Potential to Achieve Goals: Good Progress  towards PT goals: Progressing toward goals    Frequency  BID    PT Plan Current plan remains appropriate    Co-evaluation             End of Session Equipment Utilized During Treatment: Gait belt Activity Tolerance:  (pt limited by nausea ) Patient left: in bed;with bed alarm set;with call bell/phone within reach;with SCD's reapplied;with family/visitor present     Time: 2876-8115 PT Time Calculation (min) (ACUTE ONLY): 13 min  Charges:  $Gait Training: 8-22 mins $Therapeutic Exercise: 8-22 mins                    G CodesJanyth Contes May 18, 2015, 3:35 PM Janyth Contes, SPT. [phone]

## 2015-04-27 NOTE — Progress Notes (Signed)
Physical Therapy Treatment Patient Details Name: Rachel Shaffer MRN: [id number] DOB: 04-08-1951 Today's Date: 04/27/2015    History of Present Illness Pt is a 64 yo female admitted to the hospital s/p R TKA on 04/26/15    PT Comments    Pt's functional mobility is limited by pain this morning. She was able to stand and get to the chair, but she requires lots of cueing and encouragement to accomplish task. Pt has limited strength and ROM, but no noted buckling. She was able to complete 10 AROM SLRs on RLE, therefore a KI was not donned for mobility. Due to her strength, ROM, and mobility deficits, she will continue to benefit from skilled PT in order to eventually return home.   Follow Up Recommendations  SNF     Equipment Recommendations  None recommended by PT    Recommendations for Other Services       Precautions / Restrictions Precautions Precautions: Knee;Fall Precaution Booklet Issued: No Restrictions Weight Bearing Restrictions: Yes RLE Weight Bearing: Weight bearing as tolerated Other Position/Activity Restrictions: WBAT    Mobility  Bed Mobility Overal bed mobility: Needs Assistance Bed Mobility: Supine to Sit     Supine to sit: Max assist;+2 for physical assistance     General bed mobility comments: Pt requires assist for trunk and LEs. Pt with lots of c/o pain and continues to need encouragement and redirection as she tends to talk about things that are not directly related to therapy tasks, but appear to be more like excuses for why she can't perform a task.   Transfers Overall transfer level: Needs assistance Equipment used: Rolling walker (2 wheeled) Transfers: Sit to/from Stand Sit to Stand: Max assist;+2 physical assistance         General transfer comment: Pt needs heavy assist to get into standing. Needs cues to lift her chest and not slouch over her walker. No buckling noted in standing  Ambulation/Gait Ambulation/Gait assistance: Mod assist;+2  physical assistance Ambulation Distance (Feet): 3 Feet Assistive device: Rolling walker (2 wheeled) Gait Pattern/deviations: Step-to pattern;Antalgic;Decreased step length - right;Decreased step length - left;Shuffle;Decreased stride length Gait velocity: decreased Gait velocity interpretation: Below normal speed for age/gender General Gait Details: Pt needs assist for RW and occasional support of trunk when slouching. Pt struggles to sequence her gait with her RW and gets flustered with ambulation. Weight shifting to assist swingthrough works slightly    Financial trader Rankin (Stroke Patients Only)       Balance Overall balance assessment: No apparent balance deficits (not formally assessed)                                  Cognition Arousal/Alertness: Awake/alert Behavior During Therapy: WFL for tasks assessed/performed Overall Cognitive Status: Within Functional Limits for tasks assessed                      Exercises Total Joint Exercises Goniometric ROM: R knee AAROM: 6 - 69 degrees  Other Exercises Other Exercises: Pt performed bilateral therex x 10 reps at min assist for facilitation of movement. Exercises included: ankle pumps, quad sets, glute sets, hip abd, SLR, SAQ    General Comments        Pertinent Vitals/Pain Pain Assessment: 0-10 Pain Score: 8  Pain Location: R knee Pain Intervention(s): Limited activity within  patient's tolerance;Monitored during session;Premedicated before session    [place] expects to be discharged to:: Private residence Living Arrangements: Spouse/significant other Available Help at Discharge: Family;Available 24 hours/day Type of Home: House     Home Layout: Two level;Able to live on main level with bedroom/bathroom Home Equipment: Walker - 2 wheels      Prior Function Level of Independence: Independent with assistive device(s)          PT  Goals (current goals can now be found in the care plan section) Acute Rehab PT Goals Patient Stated Goal: to get to chair PT Goal Formulation: With patient Time For Goal Achievement: 05/10/15 Potential to Achieve Goals: Good Progress towards PT goals: Progressing toward goals    Frequency  BID    PT Plan Current plan remains appropriate    Co-evaluation             End of Session Equipment Utilized During Treatment: Gait belt Activity Tolerance: Patient limited by pain Patient left: in chair;with chair alarm set;with SCD's reapplied;with family/visitor present     Time: 2130-8657 PT Time Calculation (min) (ACUTE ONLY): 24 min  Charges:                       G CodesJanyth Contes 05/21/2015, 1:34 PM Janyth Contes, SPT. [phone]

## 2015-04-28 ENCOUNTER — Encounter: Payer: Self-pay | Admitting: Unknown Physician Specialty

## 2015-04-28 LAB — BASIC METABOLIC PANEL
Anion gap: 6 (ref 5–15)
BUN: 9 mg/dL (ref 6–20)
CALCIUM: 8.1 mg/dL — AB (ref 8.9–10.3)
CO2: 21 mmol/L — AB (ref 22–32)
CREATININE: 0.95 mg/dL (ref 0.44–1.00)
Chloride: 98 mmol/L — ABNORMAL LOW (ref 101–111)
GFR calc non Af Amer: >60 mL/min (ref >60)
GLUCOSE: 109 mg/dL — AB (ref 65–99)
Potassium: 4.5 mmol/L (ref 3.5–5.1)
Sodium: 125 mmol/L — ABNORMAL LOW (ref 135–145)

## 2015-04-28 LAB — HEMOGLOBIN: Hemoglobin: 11.6 g/dL — ABNORMAL LOW (ref 12.0–16.0)

## 2015-04-28 MED ORDER — MEPERIDINE HCL 50 MG PO TABS: 50.0000 mg | ORAL_TABLET | ORAL | Status: DC | PRN

## 2015-04-28 MED ORDER — LACTULOSE 10 GM/15ML PO SOLN
20.0000 g | Freq: Two times a day (BID) | ORAL | Status: DC | PRN
  Administered 2015-04-28: 20 g via ORAL
  Filled 2015-04-28: qty 30

## 2015-04-28 MED ORDER — LACTULOSE 10 GM/15ML PO SOLN: 30.0000 g | Freq: Two times a day (BID) | ORAL | Status: DC | PRN

## 2015-04-28 MED ORDER — ENOXAPARIN SODIUM 40 MG/0.4ML ~~LOC~~ SOLN: 40.0000 mg | SUBCUTANEOUS | Status: DC

## 2015-04-28 NOTE — Progress Notes (Signed)
Physical Therapy Treatment Patient Details Name: Rachel Shaffer MRN: [id number] DOB: 1950/10/13 Today's Date: 04/28/2015    History of Present Illness Pt is a 64 yo female admitted to the hospital s/p R TKA on 04/26/15    PT Comments    Pt experimenting nausea and diarrhea this afternoon, requiring multiple transfers to [facility]. Because of this, pt not accepting of functional mobility this afternoon to participate in bed exercises. Pt needs encouragement throughout tasks therex due to pain. Because of her strength, ROM, and gait impairments she will continue to benefit from skilled PT in order to eventually return home safely.   Follow Up Recommendations  SNF     Equipment Recommendations  None recommended by PT    Recommendations for Other Services       Precautions / Restrictions Precautions Precautions: Knee;Fall Precaution Booklet Issued: No Restrictions Weight Bearing Restrictions: Yes RLE Weight Bearing: Weight bearing as tolerated    Mobility  Bed Mobility               General bed mobility comments: Pt not able to perform mobility today in bed or OOB secondary to multiple transfers to bathroom for diarrhea this morning/afternoon.  Transfers Overall transfer level:  (Not appropriate)                  Ambulation/Gait Ambulation/Gait assistance:  (Not appropriate)               Stairs            Wheelchair Mobility    Modified Rankin (Stroke Patients Only)       Balance Overall balance assessment: No apparent balance deficits (not formally assessed)                                  Cognition Arousal/Alertness: Awake/alert Behavior During Therapy: WFL for tasks assessed/performed Overall Cognitive Status: Within Functional Limits for tasks assessed                      Exercises Other Exercises Other Exercises: Pt performed bilateral therex x 12 reps at min assist for facilitation of movement. Exercises  included: ankle pumps, quad sets, glute sets, hip abd, SLR, SAQ    General Comments        Pertinent Vitals/Pain Pain Assessment: 0-10 Pain Score: 8  Pain Location: R knee Pain Intervention(s): Limited activity within patient's tolerance;Monitored during session;Premedicated before session;Ice applied    Home Living                      Prior Function            PT Goals (current goals can now be found in the care plan section) Acute Rehab PT Goals Patient Stated Goal: to use the bathroom PT Goal Formulation: With patient Time For Goal Achievement: 05/10/15 Potential to Achieve Goals: Fair Progress towards PT goals: Progressing toward goals    Frequency  BID    PT Plan Current plan remains appropriate    Co-evaluation             End of Session Equipment Utilized During Treatment: Gait belt Activity Tolerance: Patient limited by pain Patient left: in chair;with call bell/phone within reach;with chair alarm set;with SCD's reapplied     Time: 1420-1431 PT Time Calculation (min) (ACUTE ONLY): 11 min  Charges:  $Therapeutic Exercise: 8-22 mins  G CodesJanyth Contes 04/28/2015, 5:21 PM Janyth Contes, SPT. [phone]

## 2015-04-28 NOTE — Progress Notes (Signed)
Subjective: 2 Days Post-Op Procedure(s) (LRB): TOTAL KNEE ARTHROPLASTY (Right) Patient reports pain as moderate.   Patient seen in rounds with Dr. Jefm Bryant. Patient is well, and has had no acute complaints or problems Plan is to go Rehab after hospital stay. Negative for chest pain and shortness of breath Fever: no Gastrointestinal: Negative for nausea and vomiting. The patient has not had a bowel movement.  Objective: Vital signs in last 24 hours: Temp:  [98.1 F (36.7 C)-99.5 F (37.5 C)] 98.9 F (37.2 C) (10/14 0414) Pulse Rate:  [77-100] 77 (10/14 0414) Resp:  [18-20] 20 (10/14 0414) BP: (109-137)/(46-86) 137/56 mmHg (10/14 0414) SpO2:  [94 %-99 %] 96 % (10/14 0414)  Intake/Output from previous day:  Intake/Output Summary (Last 24 hours) at 04/28/15 0609 Last data filed at 04/28/15 0500  Gross per 24 hour  Intake   3255 ml  Output    750 ml  Net   2505 ml    Intake/Output this shift: Total I/O In: 2655 [P.O.:480; I.V.:2175] Out: 750 [Urine:750]  Labs:  Recent Labs  04/26/15 1327  HGB 11.7*    Recent Labs  04/26/15 1327  WBC 14.2*  RBC 3.92  HCT 36.4  PLT 232    Recent Labs  04/26/15 1327 04/27/15 0725  NA  --  122*  K  --  3.8  CL  --  93*  CO2  --  23  BUN  --  12  CREATININE 1.13* 0.98  GLUCOSE  --  139*  CALCIUM  --  7.8*   No results for input(s): LABPT, INR in the last 72 hours.   EXAM General - Patient is Alert and Oriented Extremity - Neurovascular intact Dorsiflexion/Plantar flexion intact No cellulitis present Compartment soft Dressing/Incision - clean, dry, no drainage with the honeycomb dressing intact. Bulky bandage was removed. Motor Function - intact, moving foot and toes well on exam. The patient ambulated 3 feet with physical therapy.  Past Medical History  Diagnosis Date  . Hypertension   . Acid reflux   . Arthritis   . Fibromyalgia   . DJD (degenerative joint disease)   . Sjogren's disease ([place])   . Vitamin  D deficiency   . Folic acid deficiency   . Blood transfusion without reported diagnosis   . Osteoporosis   . Stroke ([place])   . Aortic regurgitation   . Cataract   . Anxiety   . Complication of anesthesia     spinal headache  blood patch  . PONV (postoperative nausea and vomiting)   . Spinal headache   . Family history of adverse reaction to anesthesia     sister scoliosis and trouble with some anesthesia  . Coronary artery disease   . Asthma   . Bronchitis, chronic ([place])   . DDD (degenerative disc disease), cervical     all of back  . Depression   . Pain     non cardiac chest pain  . Anemia   . Hypothyroidism   . Shortness of breath dyspnea     doe    Assessment/Plan: 2 Days Post-Op Procedure(s) (LRB): TOTAL KNEE ARTHROPLASTY (Right) Active Problems:   S/P total knee replacement  Estimated body mass index is 42.35 kg/(m^2) as calculated from the following:   Height as of this encounter: 5\' 3"  (1.6 m).   Weight as of this encounter: 108.41 kg (239 lb). Advance diet Up with therapy Plan for discharge tomorrow to rehabilitation.  DVT Prophylaxis - Lovenox, Foot Pumps and TED  hose Weight-Bearing as tolerated to right leg  Reche Dixon, PA-C Orthopaedic Surgery 04/28/2015, 6:09 AM

## 2015-04-28 NOTE — Discharge Summary (Signed)
Physician Discharge Summary  Subjective: 2 Days Post-Op Procedure(s) (LRB): TOTAL KNEE ARTHROPLASTY (Right) Patient reports pain as moderate.   Patient seen in rounds with Dr. Jefm Bryant. Patient is well, and has had no acute complaints or problems Patient is ready to go to rehabilitation for physical therapy.  Physician Discharge Summary  Patient ID: Rachel Shaffer MRN: [id number] DOB/AGE: 09-01-1950 64 y.o.  Admit date: 04/26/2015 Discharge date: 04/29/2015  Admission Diagnoses:  Right knee degenerative joint disease.  Discharge Diagnoses:  Active Problems:   S/P total knee replacement   Discharged Condition: fair  Hospital Course: The patient is status post right total knee replacement with Dr. Jefm Bryant on 04/26/2015. The patient has been very slow with physical therapy and pain control. The patient had her Foley removed on postop day 1 and then had to have it reinserted with bladder training and using in and out catheterization. The patient progressed and was ready to go to rehabilitation on 04/29/2015.  Treatments: surgery:  PROCEDURE: Right total knee arthroplasty  SURGEON: Dr.Harold Leeanne Mannan. M.D.  ASSISTANT: Reche Dixon, PA-C   ANESTHESIA: Gen.  ESTIMATED BLOOD LOSS: 100 mL  TOURNIQUET TIME: 95 minutes   IMPLANTS UTILIZED: Triathlon PS #3 femoral component, size 3 tibial component, size 3 9 mm tibial bearing insert, 10 mm asymmetric patella(A36mm)  Discharge Exam: Blood pressure 137/56, pulse 77, temperature 98.9 F (37.2 C), temperature source Oral, resp. rate 20, height 5\' 3"  (1.6 m), weight 108.41 kg (239 lb), SpO2 96 %.   Disposition: 01-Home or Self Care     Medication List    TAKE these medications        ALIGN 4 MG Caps  Take 4 mg by mouth every morning.     amLODipine 5 MG tablet  Commonly known as:  NORVASC  Take 5 mg by mouth daily.     aspirin EC 81 MG tablet  Take 81 mg by mouth daily.     Biotin 5000 MCG Caps  Take 1  capsule by mouth daily.     citalopram 40 MG tablet  Commonly known as:  CELEXA  Take 20 mg by mouth 2 (two) times daily.     CoQ-10 400 MG Caps  Take 400 mg by mouth daily.     diphenoxylate-atropine 2.5-0.025 MG tablet  Commonly known as:  LOMOTIL  Take 1 tablet by mouth every 6 (six) hours as needed for diarrhea or loose stools.     enoxaparin 40 MG/0.4ML injection  Commonly known as:  LOVENOX  Inject 0.4 mLs (40 mg total) into the skin daily.     Evening Primrose Oil 500 MG Caps  Take by mouth daily.     ferrous sulfate 325 (65 FE) MG tablet  Take 325 mg by mouth 2 (two) times daily with a meal.     folic acid 1 MG tablet  Commonly known as:  FOLVITE  Take 1 mg by mouth every morning.     Ginkgo Biloba 40 MG Caps  Take by mouth daily.     hydrOXYzine 25 MG tablet  Commonly known as:  ATARAX/VISTARIL  Take 25 mg by mouth every 6 (six) hours as needed for anxiety or itching.     ibuprofen 800 MG tablet  Commonly known as:  ADVIL,MOTRIN  Take 800 mg by mouth every 8 (eight) hours as needed for headache, mild pain or moderate pain.     lansoprazole 30 MG capsule  Commonly known as:  PREVACID  Take 30 mg by  mouth 2 (two) times daily.     lidocaine 5 %  Commonly known as:  LIDODERM  Place 1 patch onto the skin daily. Remove & Discard patch within 12 hours or as directed by MD     lisinopril 40 MG tablet  Commonly known as:  PRINIVIL,ZESTRIL  Take 20 mg by mouth 2 (two) times daily.     LORazepam 2 MG tablet  Commonly known as:  ATIVAN  Take 8 mg by mouth at bedtime. Takes 4 at bedtime     magnesium oxide 400 MG tablet  Commonly known as:  MAG-OX  Take 400 mg by mouth daily.     meperidine 50 MG tablet  Commonly known as:  DEMEROL  Take 1 tablet (50 mg total) by mouth every 4 (four) hours as needed for moderate pain or severe pain.     promethazine 25 MG tablet  Commonly known as:  PHENERGAN  Take 25 mg by mouth every 6 (six) hours as needed.      propranolol 40 MG tablet  Commonly known as:  INDERAL  Take 40 mg by mouth daily.     raloxifene 60 MG tablet  Commonly known as:  EVISTA  Take 60 mg by mouth daily.     simvastatin 40 MG tablet  Commonly known as:  ZOCOR  Take 40 mg by mouth every evening.     vitamin B-12 1000 MCG tablet  Commonly known as:  CYANOCOBALAMIN  Take 1,000 mcg by mouth daily.     VITAMIN B-2 PO  Take 1 tablet by mouth daily.      B complex with vitamin C can be used instead of B 12 and B 2 orders. Lisinopril is 20 mg bid for a total of 40 mg per day Lovenox, stop 05/11/15 Lorazepam should be 1 mg po qid, not 8 mg at night. Celexa 20 mg po bid is how she has had it prescirbed by her doctor, 40 mg total Ok to use Oxycodone 5 mg po q 3 hours until po Demeraol available.     Follow-up Information    Follow up with Vilinda Flake, MD In 2 weeks.   Specialty:  Orthopedic Surgery   Why:  For staple removal   Contact information:   [place] [address] [phone]       Signed: Prescott Parma, TODD 04/28/2015, 6:14 AM   Objective: Vital signs in last 24 hours: Temp:  [98.1 F (36.7 C)-99.5 F (37.5 C)] 98.9 F (37.2 C) (10/14 0414) Pulse Rate:  [77-100] 77 (10/14 0414) Resp:  [18-20] 20 (10/14 0414) BP: (109-137)/(46-86) 137/56 mmHg (10/14 0414) SpO2:  [94 %-99 %] 96 % (10/14 0414)  Intake/Output from previous day:  Intake/Output Summary (Last 24 hours) at 04/28/15 0614 Last data filed at 04/28/15 0500  Gross per 24 hour  Intake   3255 ml  Output    750 ml  Net   2505 ml    Intake/Output this shift: Total I/O In: 2655 [P.O.:480; I.V.:2175] Out: 750 [Urine:750]  Labs:  Recent Labs  04/26/15 1327  HGB 11.7*    Recent Labs  04/26/15 1327  WBC 14.2*  RBC 3.92  HCT 36.4  PLT 232    Recent Labs  04/26/15 1327 04/27/15 0725  NA  --  122*  K  --  3.8  CL  --  93*  CO2  --  23  BUN  --  12  CREATININE 1.13* 0.98  GLUCOSE  --  139*  CALCIUM   --  7.8*   No results for input(s): LABPT, INR in the last 72 hours.  EXAM: General - Patient is Alert and Oriented Extremity - Neurovascular intact Dorsiflexion/Plantar flexion intact No cellulitis present Compartment soft Incision - clean, dry, no drainage Motor Function -  patient ambulated in the room with physical therapy. She was able to do a straight leg raise. Plantarflexion and dorsiflexion of the foot and toes were intact.  Assessment/Plan: 2 Days Post-Op Procedure(s) (LRB): TOTAL KNEE ARTHROPLASTY (Right) Procedure(s) (LRB): TOTAL KNEE ARTHROPLASTY (Right) Past Medical History  Diagnosis Date  . Hypertension   . Acid reflux   . Arthritis   . Fibromyalgia   . DJD (degenerative joint disease)   . Sjogren's disease ([place])   . Vitamin D deficiency   . Folic acid deficiency   . Blood transfusion without reported diagnosis   . Osteoporosis   . Stroke ([place])   . Aortic regurgitation   . Cataract   . Anxiety   . Complication of anesthesia     spinal headache  blood patch  . PONV (postoperative nausea and vomiting)   . Spinal headache   . Family history of adverse reaction to anesthesia     sister scoliosis and trouble with some anesthesia  . Coronary artery disease   . Asthma   . Bronchitis, chronic ([place])   . DDD (degenerative disc disease), cervical     all of back  . Depression   . Pain     non cardiac chest pain  . Anemia   . Hypothyroidism   . Shortness of breath dyspnea     doe   Active Problems:   S/P total knee replacement  Estimated body mass index is 42.35 kg/(m^2) as calculated from the following:   Height as of this encounter: 5\' 3"  (1.6 m).   Weight as of this encounter: 108.41 kg (239 lb).   Discharge to skilled nursing facility.  Diet - Regular diet Follow up - in 2 weeks Activity - WBAT Disposition - Rehab  Condition Upon Discharge - Stable DVT Prophylaxis - Lovenox and TED hose  Reche Dixon, PA-C Orthopaedic Surgery 04/28/2015,  6:14 AM

## 2015-04-28 NOTE — Progress Notes (Signed)
Patient afebrile at present. Hemoglobin 11.6 today. Patient ambulated a short distance with a walker weightbearing to tolerance. Her right knee discomfort has been gradually decreasing. She is to be discharged to [facility] skilled nursing facility tomorrow. She will need to be on oral Demerol for pain control. She will probably need to be started back on her ibuprofen. She is to return to the Orthopedic Department at our [facility] facility in about 10 days. She will continue Lovenox. She is to keep her wound dry.

## 2015-04-28 NOTE — Care Management Important Message (Signed)
Important Message  Patient Details  Name: Rachel Shaffer MRN: [id number] Date of Birth: 1950/11/05   Medicare Important Message Given:  Yes-second notification given    Juliann Pulse A Allmond 04/28/2015, 10:40 AM

## 2015-04-28 NOTE — Progress Notes (Signed)
Occupational Therapy Treatment Patient Details Name: Rachel Shaffer MRN: [id number] DOB: Aug 13, 1950 Today's Date: 04/28/2015    History of present illness Pt is a 64 yo female admitted to the hospital s/p R TKA on 04/26/15   OT comments  Pt is motivated to increase independence in ADLs but is limited due to chronic and very loose diarrhea.  Assisted NSG to help move patient up in bed after being cleaned up and she was too tired to sit up EOB or get up into chair so continued discussion about rec reacher, sock aid and extra wide commode chair was discussed.  Also rec elastic shoe laces for lace up shoes.  Pt stated she was going to [facility] tomorrow for rehab.  Husband present for discussion and recommendations for adaptive equipment and encouraged patient to do as much for herself as possible and not rely on him to do it for her when going home.    Follow Up Recommendations  SNF    Equipment Recommendations  3 in 1 bedside comode (reacher and sock aid)    Recommendations for Other Services      Precautions / Restrictions Precautions Precautions: Knee;Fall Restrictions Weight Bearing Restrictions: Yes RLE Weight Bearing: Weight bearing as tolerated       Mobility Bed Mobility                  Transfers                      Balance                                   ADL                                         General ADL Comments: Pt seen to discuss adaptive equipment rec since pt was too tired to get up out of bed after being cleaned up with diarrhea for the 4th time today.  Discussed where to purchase a reacher, sock aid and wide BSC.  Pt with hx of chronic diarrhea and concern about safety  going from bed to bathroom since it is a longer distance than it is in hospital.        Vision                     Perception     Praxis      Cognition                             Extremity/Trunk Assessment               Exercises     Shoulder Instructions       General Comments      Pertinent Vitals/ Pain          Home Living                                          Prior Functioning/Environment              Frequency Min 1X/week     Progress Toward Goals  OT Goals(current goals can now be  found in the care plan section)  Progress towards OT goals: Progressing toward goals  Acute Rehab OT Goals Patient Stated Goal: to go to [facility] for rehab tomorrow Time For Goal Achievement: 05/10/15  Plan Discharge plan remains appropriate    Co-evaluation                 End of Session Equipment Utilized During Treatment:  (reacher and sock aid)   Activity Tolerance     Patient Left in bed;with family/visitor present;with bed alarm set   Nurse Communication          Time: 1400-1430 OT Time Calculation (min): 30 min  Charges: OT General Charges $OT Visit: 1 Procedure OT Treatments $Self Care/Home Management : 23-37 mins  Jassiah Viviano 04/28/2015, 2:37 PM    Chrys Racer, OTR/L ascom [phone]

## 2015-04-28 NOTE — Progress Notes (Signed)
Physical Therapy Treatment Patient Details Name: Rachel Shaffer MRN: [id number] DOB: 29-Dec-1950 Today's Date: 04/28/2015    History of Present Illness Pt is a 64 yo female admitted to the hospital s/p R TKA on 04/26/15    PT Comments    Pt experimenting nausea and diarrhea this afternoon, requiring multiple transfers to [facility]. Because of this, pt not accepting of functional mobility this aftern ng to participate in bed exercises. Pt needs encouragement throughout tasks therex due to pain. Because of her strength, ROM, and gait impairments she will continue to benefit from skilled PT in order to eventually return home safely.   Follow Up Recommendations  SNF     Equipment Recommendations  None recommended by PT    Recommendations for Other Services       Precautions / Restrictions Precautions Precautions: Knee;Fall Precaution Booklet Issued: No Restrictions Weight Bearing Restrictions: Yes RLE Weight Bearing: Weight bearing as tolerated    Mobility  Bed Mobility               General bed mobility comments: Pt not able to perform mobility today in bed or OOB secondary to multiple transfers to bathroom for diarrhea this morning/afternoon.  Transfers Overall transfer level:  (Not appropriate)                  Ambulation/Gait Ambulation/Gait assistance:  (Not appropriate)               Stairs            Wheelchair Mobility    Modified Rankin (Stroke Patients Only)       Balance Overall balance assessment: No apparent balance deficits (not formally assessed)                                  Cognition Arousal/Alertness: Awake/alert Behavior During Therapy: WFL for tasks assessed/performed Overall Cognitive Status: Within Functional Limits for tasks assessed                      Exercises Other Exercises Other Exercises: Pt performed bilateral therex x 12 reps at min assist for facilitation of movement. Exercises  included: ankle pumps, quad sets, glute sets, hip abd, SLR, SAQ    General Comments        Pertinent Vitals/Pain Pain Assessment: 0-10 Pain Score: 8  Pain Location: R knee Pain Intervention(s): Limited activity within patient's tolerance;Monitored during session;Premedicated before session;Ice applied    Home Living                      Prior Function            PT Goals (current goals can now be found in the care plan section) Acute Rehab PT Goals Patient Stated Goal: to use the bathroom PT Goal Formulation: With patient Time For Goal Achievement: 05/10/15 Potential to Achieve Goals: Fair Progress towards PT goals: Progressing toward goals    Frequency  BID    PT Plan Current plan remains appropriate    Co-evaluation             End of Session Equipment Utilized During Treatment: Gait belt Activity Tolerance: Patient limited by pain Patient left: in chair;with call bell/phone within reach;with chair alarm set;with SCD's reapplied     Time: 1420-1431 PT Time Calculation (min) (ACUTE ONLY): 11 min  Charges:  G CodesJanyth Contes 04/28/2015, 4:22 PM Janyth Contes, SPT. [phone]

## 2015-04-28 NOTE — Progress Notes (Signed)
Pain managed with oral and IV demerol.   Pt was able to have a BM today.  Moving slowly with 2 max assist.

## 2015-04-28 NOTE — Progress Notes (Addendum)
Plan is for patient to D/C to [facility] Saturday 04/29/15. Per Seth Bake admissions coordinator at [facility] patient is going to room 201-A. RN will call report at [phone]. Clinical Education officer, museum (CSW) sent D/C Summary to [employer] via carefinder today. Patient is aware of above. CSW will continue to follow and assist as needed.   Belinda RN from [facility] had questions about medications on D/C Summary. Dr. Rudene Christians corrected D/C Summary. CSW sent updated D/C Summary to [facility].   Blima Rich, Cyril [phone]

## 2015-04-28 NOTE — Progress Notes (Signed)
Physical Therapy Treatment Patient Details Name: Rachel Shaffer MRN: [id number] DOB: [birth year] Today's Date: 04/28/2015    History of Present Illness Pt is a 64 yo female admitted to the hospital s/p R TKA on 04/26/15    PT Comments    Pt continues to be limited by pain and anxious about mobility secondary to that pain. Pt requiring lots of cues for proper use of RW, which when able to use correctly should assist her with ease of transfers and ambulation to off-load RLE for pain relief. She is still willing to participate with therapy and give effort regardless of pain. She will continue to benefit from skilled PT in order for her to eventually return home safely.   Follow Up Recommendations  SNF     Equipment Recommendations  None recommended by PT    Recommendations for Other Services       Precautions / Restrictions Precautions Precautions: Knee;Fall Precaution Booklet Issued: No Restrictions Weight Bearing Restrictions: Yes RLE Weight Bearing: Weight bearing as tolerated    Mobility  Bed Mobility Overal bed mobility: Needs Assistance Bed Mobility: Supine to Sit     Supine to sit: Mod assist     General bed mobility comments: Less assist required today. Pt needs assist for LEs as well as cues for hand placement to assist herself in scooting   Transfers Overall transfer level: Needs assistance Equipment used: Rolling walker (2 wheeled) Transfers: Sit to/from [facility] Sit to Stand: Mod assist;+2 physical assistance Stand pivot transfers: Mod assist;+2 physical assistance       General transfer comment: Pt requires mod assist getting into standing, but requires heavy cues for any stand-pivot transfer. She needs cues for hand placement and foot sequence as well as assist for RW pivot   Ambulation/Gait Ambulation/Gait assistance: Mod assist Ambulation Distance (Feet): 3 Feet Assistive device: Rolling walker (2 wheeled) Gait Pattern/deviations:  Step-to pattern;Decreased step length - right;Decreased step length - left;Decreased stride length;Antalgic;Shuffle Gait velocity: decreased Gait velocity interpretation: Below normal speed for age/gender General Gait Details: Pt needs heavy cues for use walker and step sequence. She needs assist for weight shifting to assist swing through    Stairs            Wheelchair Mobility    Modified Rankin (Stroke Patients Only)       Balance Overall balance assessment: No apparent balance deficits (not formally assessed)                                  Cognition Arousal/Alertness: Awake/alert Behavior During Therapy: WFL for tasks assessed/performed Overall Cognitive Status: Within Functional Limits for tasks assessed                      Exercises Total Joint Exercises Goniometric ROM: R knee AAROM: 6 - 73 degrees  (limited by pain) Other Exercises Other Exercises: Pt performed bilateral therex x 12 reps at min assist for facilitation of movement. Exercises included: ankle pumps, quad sets, glute sets, hip abd, SLR, SAQ    General Comments        Pertinent Vitals/Pain Pain Assessment: 0-10 Pain Score: 7  Pain Location: R knee Pain Intervention(s): Limited activity within patient's tolerance;Monitored during session;Premedicated before session;Ice applied    Home Living                      Prior Function  PT Goals (current goals can now be found in the care plan section) Acute Rehab PT Goals Patient Stated Goal: to use bathroom PT Goal Formulation: With patient Time For Goal Achievement: 05/10/15 Potential to Achieve Goals: Fair Progress towards PT goals: Progressing toward goals (limited by pain)    Frequency  BID    PT Plan Current plan remains appropriate    Co-evaluation             End of Session Equipment Utilized During Treatment: Gait belt Activity Tolerance: Patient limited by pain Patient left:  in chair;with call bell/phone within reach;with chair alarm set;with SCD's reapplied     Time: 9747-1855 PT Time Calculation (min) (ACUTE ONLY): 24 min  Charges:                       G CodesJanyth Contes 13-May-2015, 10:33 AM Janyth Contes, SPT. [phone]

## 2015-04-28 NOTE — Discharge Instructions (Signed)

## 2015-04-29 LAB — SODIUM: SODIUM: 130 mmol/L — AB (ref 135–145)

## 2015-04-29 NOTE — Clinical Social Work Note (Signed)
Patient to discharge today to [facility] as planned. Patient is in agreement and states she has already informed her husband of her discharge today as he is going to get her some clothes. Patient to transport via EMS.  Shela Leff MSW,LCSW [phone]

## 2015-04-29 NOTE — Progress Notes (Signed)
Physical Therapy Treatment Patient Details Name: Rachel Shaffer MRN: [id number] DOB: 1950/09/23 Today's Date: 04/29/2015    History of Present Illness Pt is a 64 yo female admitted to the hospital s/p R TKA on 04/26/15    PT Comments    Pt ready for discharge to SNF this date.  Pt up to [facility] with Min A for transfer with RW and total assist for hygeine after toileting.  Pt able to take several steps with verbal cues otherwise leans on the RW with elbows and shuffle steps.  Pt with low pain tolerance.  Cont with POC.  Follow Up Recommendations  SNF     Equipment Recommendations  None recommended by PT    Recommendations for Other Services       Precautions / Restrictions Precautions Precautions: Knee;Fall Restrictions Weight Bearing Restrictions: Yes RLE Weight Bearing: Weight bearing as tolerated    Mobility  Bed Mobility               General bed mobility comments: Supine to sit with Min A for R LE management; bed rails and HOB elevated  Transfers                 General transfer comment: Sit<>stamd with SBA, L hand on bed rail and R hand on RW  Ambulation/Gait             General Gait Details: bed<>bsc with CGA and verbal cues to hold onto walker for entire session; pt leaning on RW with forearms; instructed to push down with straight arms.   Stairs            Wheelchair Mobility    Modified Rankin (Stroke Patients Only)       Balance                                    Cognition Arousal/Alertness: Awake/alert Behavior During Therapy: WFL for tasks assessed/performed Overall Cognitive Status: Within Functional Limits for tasks assessed                      Exercises Total Joint Exercises Ankle Circles/Pumps: AROM;Both;10 reps Quad Sets: AROM;Both;10 reps Heel Slides: AROM;Both;10 reps Hip ABduction/ADduction: AAROM;Both;10 reps Straight Leg Raises: AAROM;Both;10 reps Knee Flexion: AAROM;Both;Seated     General Comments General comments (skin integrity, edema, etc.): Total assist for hygeien after toileting      Pertinent Vitals/Pain Pain Intervention(s): Limited activity within patient's tolerance    Home Living                      Prior Function            PT Goals (current goals can now be found in the care plan section) Acute Rehab PT Goals Patient Stated Goal: to use the bathroom PT Goal Formulation: With patient Time For Goal Achievement: 05/10/15 Potential to Achieve Goals: Fair Progress towards PT goals: Progressing toward goals    Frequency  BID    PT Plan Current plan remains appropriate    Co-evaluation             End of Session   Activity Tolerance: No increased pain Patient left: in bed;with nursing/sitter in room     Time: 9326-7124 PT Time Calculation (min) (ACUTE ONLY): 28 min  Charges:  $Therapeutic Exercise: 8-22 mins $Therapeutic Activity: 8-22 mins  G Codes:      Nyema Hachey A Kashaun Bebo 04/29/2015, 9:43 AM

## 2015-04-29 NOTE — Clinical Social Work Placement (Signed)
   CLINICAL SOCIAL WORK PLACEMENT  NOTE  Date:  04/29/2015  Patient Details  Name: Rachel Shaffer MRN: [id number] Date of Birth: March 16, 1951  Clinical Social Work is seeking post-discharge placement for this patient at the [place] level of care (*CSW will initial, date and re-position this form in  chart as items are completed):  Yes   Patient/family provided with [place] Work Department's list of facilities offering this level of care within the geographic area requested by the patient (or if unable, by the patient's family).  Yes   Patient/family informed of their freedom to choose among providers that offer the needed level of care, that participate in Medicare, Medicaid or managed care program needed by the patient, have an available bed and are willing to accept the patient.  Yes   Patient/family informed of [facility]'s ownership interest in [facility] and [facility], as well as of the fact that they are under no obligation to receive care at these facilities.  PASRR submitted to EDS on 04/27/15     PASRR number received on 04/27/15     Existing PASRR number confirmed on       FL2 transmitted to all facilities in geographic area requested by pt/family on 04/27/15     FL2 transmitted to all facilities within larger geographic area on       Patient informed that his/her managed care company has contracts with or will negotiate with certain facilities, including the following:        Yes   Patient/family informed of bed offers received.  Patient chooses bed at  [facility] )     Physician recommends and patient chooses bed at      Patient to be transferred to   on 04/29/15.  Patient to be transferred to facility by  (EMS)     Patient family notified on 04/29/15 of transfer.  Name of family member notified:   (Patient notified her husband)     PHYSICIAN Please sign FL2     Additional Comment:     _______________________________________________ Shela Leff, LCSW 04/29/2015, 12:56 PM

## 2015-04-29 NOTE — Progress Notes (Signed)
Patient complaining of pain, and SBP <100.  Patient requesting pain medication.  Paged PA to get instructions on whether or not I can Proceed with po pain medication.  Will be calling report to facility as well.

## 2015-04-29 NOTE — Progress Notes (Signed)
   Subjective: 3 Days Post-Op Procedure(s) (LRB): TOTAL KNEE ARTHROPLASTY (Right) Patient reports pain as mild.   Patient is well, and has had no acute complaints or problems We will start therapy today.  Plan is to go Rehab after hospital stay.  Objective: Vital signs in last 24 hours: Temp:  [98.2 F (36.8 C)-99.1 F (37.3 C)] 99.1 F (37.3 C) (10/15 0536) Pulse Rate:  [73-87] 87 (10/15 0536) Resp:  [18] 18 (10/15 0536) BP: (107-164)/(32-70) 164/32 mmHg (10/15 0536) SpO2:  [97 %-100 %] 98 % (10/15 0536)  Intake/Output from previous day: 10/14 0701 - 10/15 0700 In: 720 [P.O.:720] Out: 1200 [Urine:1200] Intake/Output this shift:     Recent Labs  04/26/15 1327 04/28/15 0546  HGB 11.7* 11.6*    Recent Labs  04/26/15 1327  WBC 14.2*  RBC 3.92  HCT 36.4  PLT 232    Recent Labs  04/27/15 0725 04/28/15 0546  NA 122* 125*  K 3.8 4.5  CL 93* 98*  CO2 23 21*  BUN 12 9  CREATININE 0.98 0.95  GLUCOSE 139* 109*  CALCIUM 7.8* 8.1*   No results for input(s): LABPT, INR in the last 72 hours.  EXAM General - Patient is Alert, Appropriate and Oriented Extremity - Neurovascular intact Sensation intact distally Intact pulses distally Dorsiflexion/Plantar flexion intact Dressing - dressing C/D/I and no drainage Motor Function - intact, moving foot and toes well on exam.   Past Medical History  Diagnosis Date  . Hypertension   . Acid reflux   . Arthritis   . Fibromyalgia   . DJD (degenerative joint disease)   . Sjogren's disease ([place])   . Vitamin D deficiency   . Folic acid deficiency   . Blood transfusion without reported diagnosis   . Osteoporosis   . Stroke ([place])   . Aortic regurgitation   . Cataract   . Anxiety   . Complication of anesthesia     spinal headache  blood patch  . PONV (postoperative nausea and vomiting)   . Spinal headache   . Family history of adverse reaction to anesthesia     sister scoliosis and trouble with some anesthesia  .  Coronary artery disease   . Asthma   . Bronchitis, chronic ([place])   . DDD (degenerative disc disease), cervical     all of back  . Depression   . Pain     non cardiac chest pain  . Anemia   . Hypothyroidism   . Shortness of breath dyspnea     doe    Assessment/Plan:   3 Days Post-Op Procedure(s) (LRB): TOTAL KNEE ARTHROPLASTY (Right) Active Problems:   S/P total knee replacement  Estimated body mass index is 42.35 kg/(m^2) as calculated from the following:   Height as of this encounter: 5\' 3"  (1.6 m).   Weight as of this encounter: 108.41 kg (239 lb). Advance diet Up with therapy  Discharge to rehab today Follow up with [facility] in 10 days   DVT Prophylaxis - Lovenox, Foot Pumps and TED hose Weight-Bearing as tolerated to right leg D/C O2 and Pulse OX and try on Room Air  T. Rachelle Hora, PA-C [place] 04/29/2015, 7:04 AM

## 2015-04-29 NOTE — Progress Notes (Signed)
PA approved po pain medication with manual blood pressures.  Called report to [facility] spoke with Horton Chin, LPN.  Called for non emergent transportation.

## 2015-05-01 DIAGNOSIS — Z96651 Presence of right artificial knee joint: Secondary | ICD-10-CM | POA: Diagnosis not present

## 2015-05-04 DIAGNOSIS — K219 Gastro-esophageal reflux disease without esophagitis: Secondary | ICD-10-CM | POA: Diagnosis not present

## 2015-05-04 DIAGNOSIS — M797 Fibromyalgia: Secondary | ICD-10-CM

## 2015-05-04 DIAGNOSIS — I10 Essential (primary) hypertension: Secondary | ICD-10-CM | POA: Diagnosis not present

## 2015-05-04 DIAGNOSIS — M1711 Unilateral primary osteoarthritis, right knee: Secondary | ICD-10-CM | POA: Diagnosis not present

## 2015-05-04 DIAGNOSIS — G43919 Migraine, unspecified, intractable, without status migrainosus: Secondary | ICD-10-CM | POA: Diagnosis not present

## 2015-05-04 DIAGNOSIS — F39 Unspecified mood [affective] disorder: Secondary | ICD-10-CM

## 2015-05-05 DIAGNOSIS — Z96651 Presence of right artificial knee joint: Secondary | ICD-10-CM | POA: Diagnosis not present

## 2015-05-08 ENCOUNTER — Telehealth: Payer: Self-pay | Admitting: Internal Medicine

## 2015-05-08 NOTE — Telephone Encounter (Signed)
She is on bactrim and I checked the site today and it is better Going home tomorrow

## 2015-05-08 NOTE — Telephone Encounter (Signed)
PLEASE NOTE: All timestamps contained within this report are represented as [place] Standard Time. CONFIDENTIALTY NOTICE: This fax transmission is intended only for the addressee. It contains information that is legally privileged, confidential or otherwise protected from use or disclosure. If you are not the intended recipient, you are strictly prohibited from reviewing, disclosing, copying using or disseminating any of this information or taking any action in reliance on or regarding this information. If you have received this fax in error, please notify us immediately by telephone so that we can arrange for its return to [place]. Phone: [phone], Toll-Free: [phone], Fax: [phone] Page: 1 of 2 Call Id: [id number] [place] Patient Name: Rachel Shaffer Gender: Female DOB: 05-Mar-1951 Age: 64 Y 46 M 8 D Return Phone Number: Address: City/State/Zip: [place] Client [place] Night - Client Client Site [place] Physician Viviana Simpler Contact Type Call Call Type Page Only Caller Name Rip Harbour Relationship To Patient Care Giver Is this call to report lab results? No Return Phone Number Please choose phone number Initial Comment Rip Harbour from [facility] @ [phone] states patient's surgical site is red, swollen, hot to touch, you can see green drainage under honeycomb dressing Nurse Assessment Guidelines Guideline Title Affirmed Question Affirmed Notes Nurse Date/Time ([place] Time) Disp. Time Eilene Ghazi Time) Disposition Final User 05/06/2015 10:08:05 AM Send to [facility] Paging Queue [place] 05/06/2015 10:13:39 AM Paged On Call back to Call [place], [place] 05/06/2015 10:13:53 AM Paged On Call back to Call [place] 05/06/2015 10:29:54 AM Paged On Call back to Call [place], [place] 05/06/2015 10:30:11 AM Paged On Call back to Call [place], [place] 05/06/2015 11:06:44 AM Paged On Call back to Call [place], [place] 05/06/2015 11:06:55 AM Paged On Call back to Call [place], [place] 05/06/2015 11:46:22 AM Page Completed Yes Byrd Hesselbach After Care Instructions Given Call Event Type User Date / Time Description Paging [facility] Phone DateTime Result/Outcome Message Type Notes Thersa Salt [phone] 05/06/2015 10:13:39 AM Called On Call Provider - Left Message Doctor Paged Thersa Salt [phone] 05/06/2015 10:29:54 AM Called On Call Provider - Left Message Doctor Paged PLEASE NOTE: All timestamps contained within this report are represented as [place] Standard Time. CONFIDENTIALTY NOTICE: This fax transmission is intended only for the addressee. It contains information that is legally privileged, confidential or otherwise protected from use or disclosure. If you are not the intended recipient, you are strictly prohibited from reviewing, disclosing, copying using or disseminating any of this information or taking any action in reliance on or regarding this information. If you have received this fax in error, please notify us immediately by telephone so that we can arrange for its return to [place]. Phone: [phone], Toll-Free: [phone], Fax: [phone] Page: 2 of 2 Call Id: [id number] [place] Phone DateTime Result/Outcome Message Type Notes Thersa Salt [phone] 05/06/2015 11:06:44 AM Called On Call Provider - Left Message Doctor Paged Thersa Salt 05/06/2015 11:46:12 AM Unable to Reach on call - Max Attempts Message Result

## 2015-05-11 ENCOUNTER — Inpatient Hospital Stay
Admission: EM | Admit: 2015-05-11 | Discharge: 2015-05-16 | DRG: 378 | Disposition: A | Discharge status: Home / Self Care | Payer: Medicare Other | Attending: Internal Medicine | Admitting: Internal Medicine

## 2015-05-11 ENCOUNTER — Encounter: Payer: Self-pay | Admitting: Emergency Medicine

## 2015-05-11 DIAGNOSIS — Z8619 Personal history of other infectious and parasitic diseases: Secondary | ICD-10-CM | POA: Diagnosis not present

## 2015-05-11 DIAGNOSIS — Z96651 Presence of right artificial knee joint: Secondary | ICD-10-CM | POA: Diagnosis present

## 2015-05-11 DIAGNOSIS — Z7982 Long term (current) use of aspirin: Secondary | ICD-10-CM | POA: Diagnosis not present

## 2015-05-11 DIAGNOSIS — Z79899 Other long term (current) drug therapy: Secondary | ICD-10-CM

## 2015-05-11 DIAGNOSIS — Z8249 Family history of ischemic heart disease and other diseases of the circulatory system: Secondary | ICD-10-CM | POA: Diagnosis not present

## 2015-05-11 DIAGNOSIS — K219 Gastro-esophageal reflux disease without esophagitis: Secondary | ICD-10-CM | POA: Diagnosis present

## 2015-05-11 DIAGNOSIS — F419 Anxiety disorder, unspecified: Secondary | ICD-10-CM | POA: Diagnosis present

## 2015-05-11 DIAGNOSIS — M1990 Unspecified osteoarthritis, unspecified site: Secondary | ICD-10-CM | POA: Diagnosis present

## 2015-05-11 DIAGNOSIS — K922 Gastrointestinal hemorrhage, unspecified: Secondary | ICD-10-CM | POA: Diagnosis present

## 2015-05-11 DIAGNOSIS — Z825 Family history of asthma and other chronic lower respiratory diseases: Secondary | ICD-10-CM

## 2015-05-11 DIAGNOSIS — Z823 Family history of stroke: Secondary | ICD-10-CM

## 2015-05-11 DIAGNOSIS — Z8673 Personal history of transient ischemic attack (TIA), and cerebral infarction without residual deficits: Secondary | ICD-10-CM | POA: Diagnosis not present

## 2015-05-11 DIAGNOSIS — M797 Fibromyalgia: Secondary | ICD-10-CM | POA: Diagnosis present

## 2015-05-11 DIAGNOSIS — Z9889 Other specified postprocedural states: Secondary | ICD-10-CM

## 2015-05-11 DIAGNOSIS — M810 Age-related osteoporosis without current pathological fracture: Secondary | ICD-10-CM | POA: Diagnosis present

## 2015-05-11 DIAGNOSIS — Z803 Family history of malignant neoplasm of breast: Secondary | ICD-10-CM

## 2015-05-11 DIAGNOSIS — K625 Hemorrhage of anus and rectum: Secondary | ICD-10-CM | POA: Diagnosis present

## 2015-05-11 DIAGNOSIS — Z8261 Family history of arthritis: Secondary | ICD-10-CM | POA: Diagnosis not present

## 2015-05-11 DIAGNOSIS — Z86011 Personal history of benign neoplasm of the brain: Secondary | ICD-10-CM | POA: Diagnosis not present

## 2015-05-11 DIAGNOSIS — Z888 Allergy status to other drugs, medicaments and biological substances status: Secondary | ICD-10-CM

## 2015-05-11 DIAGNOSIS — I2510 Atherosclerotic heart disease of native coronary artery without angina pectoris: Secondary | ICD-10-CM | POA: Diagnosis present

## 2015-05-11 DIAGNOSIS — Z90710 Acquired absence of both cervix and uterus: Secondary | ICD-10-CM | POA: Diagnosis not present

## 2015-05-11 DIAGNOSIS — R1310 Dysphagia, unspecified: Secondary | ICD-10-CM | POA: Diagnosis present

## 2015-05-11 DIAGNOSIS — E039 Hypothyroidism, unspecified: Secondary | ICD-10-CM | POA: Diagnosis present

## 2015-05-11 DIAGNOSIS — Z791 Long term (current) use of non-steroidal anti-inflammatories (NSAID): Secondary | ICD-10-CM

## 2015-05-11 DIAGNOSIS — Z9049 Acquired absence of other specified parts of digestive tract: Secondary | ICD-10-CM | POA: Diagnosis not present

## 2015-05-11 DIAGNOSIS — I10 Essential (primary) hypertension: Secondary | ICD-10-CM | POA: Diagnosis present

## 2015-05-11 DIAGNOSIS — Z6841 Body Mass Index (BMI) 40.0 and over, adult: Secondary | ICD-10-CM

## 2015-05-11 DIAGNOSIS — J45909 Unspecified asthma, uncomplicated: Secondary | ICD-10-CM | POA: Diagnosis present

## 2015-05-11 DIAGNOSIS — K2970 Gastritis, unspecified, without bleeding: Secondary | ICD-10-CM | POA: Diagnosis present

## 2015-05-11 DIAGNOSIS — E785 Hyperlipidemia, unspecified: Secondary | ICD-10-CM | POA: Diagnosis present

## 2015-05-11 DIAGNOSIS — Z91041 Radiographic dye allergy status: Secondary | ICD-10-CM | POA: Diagnosis not present

## 2015-05-11 DIAGNOSIS — Z821 Family history of blindness and visual loss: Secondary | ICD-10-CM | POA: Diagnosis not present

## 2015-05-11 DIAGNOSIS — K921 Melena: Principal | ICD-10-CM | POA: Diagnosis present

## 2015-05-11 DIAGNOSIS — Z87891 Personal history of nicotine dependence: Secondary | ICD-10-CM | POA: Diagnosis not present

## 2015-05-11 HISTORY — DX: Gastro-esophageal reflux disease without esophagitis: K21.9

## 2015-05-11 LAB — COMPREHENSIVE METABOLIC PANEL
ALT: 16 U/L (ref 14–54)
ANION GAP: 9 (ref 5–15)
AST: 20 U/L (ref 15–41)
Albumin: 3 g/dL — ABNORMAL LOW (ref 3.5–5.0)
Alkaline Phosphatase: 109 U/L (ref 38–126)
BUN: 15 mg/dL (ref 6–20)
CHLORIDE: 108 mmol/L (ref 101–111)
CO2: 19 mmol/L — AB (ref 22–32)
Calcium: 8.5 mg/dL — ABNORMAL LOW (ref 8.9–10.3)
Creatinine, Ser: 1.19 mg/dL — ABNORMAL HIGH (ref 0.44–1.00)
GFR calc non Af Amer: 47 mL/min — ABNORMAL LOW (ref >60)
GFR, EST AFRICAN AMERICAN: 54 mL/min — AB (ref >60)
Glucose, Bld: 125 mg/dL — ABNORMAL HIGH (ref 65–99)
POTASSIUM: 3.7 mmol/L (ref 3.5–5.1)
SODIUM: 136 mmol/L (ref 135–145)
Total Bilirubin: 0.4 mg/dL (ref 0.3–1.2)
Total Protein: 6.5 g/dL (ref 6.5–8.1)

## 2015-05-11 LAB — CBC
HEMATOCRIT: 32.3 % — AB (ref 35.0–47.0)
HEMOGLOBIN: 10.6 g/dL — AB (ref 12.0–16.0)
MCH: 30.1 pg (ref 26.0–34.0)
MCHC: 33 g/dL (ref 32.0–36.0)
MCV: 91.3 fL (ref 80.0–100.0)
Platelets: 518 10*3/uL — ABNORMAL HIGH (ref 150–440)
RBC: 3.54 MIL/uL — AB (ref 3.80–5.20)
RDW: 13.5 % (ref 11.5–14.5)
WBC: 10.9 10*3/uL (ref 3.6–11.0)

## 2015-05-11 LAB — TYPE AND SCREEN
ABO/RH(D): O POS
Antibody Screen: NEGATIVE

## 2015-05-11 MED ORDER — ONDANSETRON HCL 4 MG/2ML IJ SOLN
4.0000 mg | Freq: Once | INTRAMUSCULAR | Status: AC
  Administered 2015-05-11: 4 mg via INTRAVENOUS
  Filled 2015-05-11: qty 2

## 2015-05-11 NOTE — H&P (Signed)
[facility] [place] NAME: Rachel Shaffer    MR#:  [id number]  DATE OF BIRTH:  Dec 18, 1950  DATE OF ADMISSION:  05/11/2015  PRIMARY CARE PHYSICIAN: Idelle Crouch, MD   REQUESTING/REFERRING PHYSICIAN: Corky Downs, M.D.  CHIEF COMPLAINT:   Chief Complaint  Patient presents with  . Rectal Bleeding    HISTORY OF PRESENT ILLNESS:  Rachel Shaffer  is a 64 y.o. female who presents with bright red blood per rectum. Patient states that she began having some abdominal pain with some nausea in the morning on the day of presentation to the ED. She then began having some loose stools and diarrhea. Subsequently she began to see bright red blood per rectum. At first it was small quantities which she described as "spotty". Then she had a large bowel movement that seem to be mostly blood. At this point she came to the ED for evaluation. She had another episode in the ED of bloody stool. Of note patient had a recent right knee replacement. She has been on Lovenox, but just finished her course Lovenox on the day of presentation to ED. She is also been using ibuprofen to assist with her pain control. Patient does state that she has some epigastric abdominal pain, that she feels will then "move down". Hospitalists were called for admission for GI bleed.  PAST MEDICAL HISTORY:   Past Medical History  Diagnosis Date  . Hypertension   . GERD (gastroesophageal reflux disease)   . Arthritis   . Fibromyalgia   . DJD (degenerative joint disease)   . Sjogren's disease ([place])   . Vitamin D deficiency   . Folic acid deficiency   . Blood transfusion without reported diagnosis   . Osteoporosis   . Stroke ([place])   . Aortic regurgitation   . Cataract   . Anxiety   . Complication of anesthesia     spinal headache  blood patch  . PONV (postoperative nausea and vomiting)   . Spinal headache   . Family history of adverse reaction to anesthesia     sister scoliosis and  trouble with some anesthesia  . Coronary artery disease   . Asthma   . Bronchitis, chronic ([place])   . DDD (degenerative disc disease), cervical     all of back  . Depression   . Pain     non cardiac chest pain  . Anemia   . Hypothyroidism     PAST SURGICAL HISTORY:   Past Surgical History  Procedure Laterality Date  . Appendectomy    . Abdominal hysterectomy    . Cholecystectomy    . Breast biopsy Left 1992    negative  . Knee arthroscopy Left   . Tonsillectomy  1959  . Oophorectomy    . Brain surgery      meningioma  . Colonoscopy    . Esophagogastroduodenoscopy    . Stomach surgery      gastroplasty  . Total knee arthroplasty Right 04/26/2015    Procedure: TOTAL KNEE ARTHROPLASTY;  Surgeon: Leanor Kail, MD;  Location: [facility] ORS;  Service: Orthopedics;  Laterality: Right;    SOCIAL HISTORY:   Social History  Substance Use Topics  . Smoking status: Former Smoker    Types: Cigarettes    Quit date: 04/12/1975  . Smokeless tobacco: Not on file  . Alcohol Use: No    FAMILY HISTORY:   Family History  Problem Relation Age of Onset  . Breast cancer Maternal  Aunt 60  . Arthritis Mother   . Vision loss Mother   . Heart disease Mother   . Hypertension Mother   . Hyperlipidemia Mother   . Kidney disease Mother   . Stroke Father   . Asthma Father   . COPD Father   . Heart disease Father   . Hypertension Father   . Hyperlipidemia Father   . Kidney disease Father     DRUG ALLERGIES:   Allergies  Allergen Reactions  . Ceftin [Cefuroxime] Anaphylaxis  . Other Anaphylaxis and Swelling    Kiwi, black walnuts, pineapple, nuts.    . Cadmium   . Cephalexin   . Codeine   . Contrast Media [Iodinated Diagnostic Agents] Hives    Patient denies  . Depakote [Divalproex Sodium]   . Dilaudid [Hydromorphone Hcl] Other (See Comments)    hallucinations  . Feldene [Piroxicam]   . Fosamax [Alendronate] Nausea And Vomiting  . Imitrex [Sumatriptan] Other (See  Comments)    Heart beats went up.  . Latex   . Methotrexate Derivatives Other (See Comments)    Gi bleeding  . Morphine And Related Nausea And Vomiting  . Nexium [Esomeprazole Magnesium]   . Nsaids Itching    Except for ibuprofen Bumps.  Gean [place] [Tapentadol] Swelling    Throat,tongue  . Percocet [Oxycodone-Acetaminophen] Itching  . Protonix [Pantoprazole Sodium]   . Relafen [Nabumetone]   . Tegretol [Carbamazepine] Other (See Comments)    Aplastic anemia  . Topamax [Topiramate]   . Trazodone And Nefazodone   . Vicodin [Hydrocodone-Acetaminophen] Itching  . Wellbutrin [Bupropion]     MEDICATIONS AT HOME:   Prior to Admission medications   Medication Sig Start Date End Date Taking? Authorizing Provider  amLODipine (NORVASC) 5 MG tablet Take 5 mg by mouth daily.   Yes Historical Provider, MD  aspirin EC 81 MG tablet Take 81 mg by mouth daily.   Yes Historical Provider, MD  Biotin 5000 MCG CAPS Take 1 capsule by mouth daily.   Yes Historical Provider, MD  butalbital-acetaminophen-caffeine (FIORICET, ESGIC) 50-325-40 MG tablet Take 2 tablets by mouth every 4 (four) hours as needed. 03/31/15  Yes Historical Provider, MD  citalopram (CELEXA) 40 MG tablet Take 20 mg by mouth 2 (two) times daily.    Yes Historical Provider, MD  Coenzyme Q10 (COQ-10) 400 MG CAPS Take 400 mg by mouth daily.    Yes Historical Provider, MD  enoxaparin (LOVENOX) 40 MG/0.4ML injection Inject 0.4 mLs (40 mg total) into the skin daily. 04/28/15  Yes Reche Dixon, PA-C  Evening Primrose Oil 500 MG CAPS Take by mouth daily.   Yes Historical Provider, MD  hydrOXYzine (ATARAX/VISTARIL) 25 MG tablet Take 25 mg by mouth every 6 (six) hours as needed for anxiety or itching.    Yes Historical Provider, MD  ibuprofen (ADVIL,MOTRIN) 800 MG tablet Take 800 mg by mouth every 8 (eight) hours as needed for headache, mild pain or moderate pain.    Yes Historical Provider, MD  lansoprazole (PREVACID) 30 MG capsule Take 30 mg by  mouth 2 (two) times daily.   Yes Historical Provider, MD  lidocaine (LIDODERM) 5 % Place 1 patch onto the skin daily. Remove & Discard patch within 12 hours or as directed by MD   Yes Historical Provider, MD  lisinopril (PRINIVIL,ZESTRIL) 40 MG tablet Take 20 mg by mouth 2 (two) times daily.    Yes Historical Provider, MD  LORazepam (ATIVAN) 2 MG tablet Take 8 mg by mouth at bedtime.  Takes 4 at bedtime   Yes Historical Provider, MD  polyethylene glycol (MIRALAX / GLYCOLAX) packet Take 17 g by mouth daily as needed.   Yes Historical Provider, MD  promethazine (PHENERGAN) 25 MG tablet Take 25 mg by mouth every 6 (six) hours as needed.    Yes Historical Provider, MD  propranolol ER (INDERAL LA) 60 MG 24 hr capsule Take 60 mg by mouth daily.   Yes Historical Provider, MD  sulfamethoxazole-trimethoprim (BACTRIM DS,SEPTRA DS) 800-160 MG tablet Take 1 tablet by mouth 2 (two) times daily.   Yes Historical Provider, MD  diphenoxylate-atropine (LOMOTIL) 2.5-0.025 MG tablet Take 1 tablet by mouth every 6 (six) hours as needed for diarrhea or loose stools.    Historical Provider, MD  ferrous sulfate 325 (65 FE) MG tablet Take 325 mg by mouth 2 (two) times daily with a meal.    Historical Provider, MD  folic acid (FOLVITE) 1 MG tablet Take 1 mg by mouth every morning.    Historical Provider, MD  Ginkgo Biloba 40 MG CAPS Take by mouth daily.    Historical Provider, MD  magnesium oxide (MAG-OX) 400 MG tablet Take 400 mg by mouth daily.    Historical Provider, MD  meperidine (DEMEROL) 50 MG tablet Take 1 tablet (50 mg total) by mouth every 4 (four) hours as needed for moderate pain or severe pain. Patient not taking: Reported on 05/11/2015 04/28/15   Reche Dixon, PA-C  Probiotic Product (ALIGN) 4 MG CAPS Take 4 mg by mouth every morning.    Historical Provider, MD  raloxifene (EVISTA) 60 MG tablet Take 60 mg by mouth daily.    Historical Provider, MD  Riboflavin (VITAMIN B-2 PO) Take 1 tablet by mouth daily.     Historical Provider, MD  simvastatin (ZOCOR) 40 MG tablet Take 40 mg by mouth every evening.    Historical Provider, MD  vitamin B-12 (CYANOCOBALAMIN) 1000 MCG tablet Take 1,000 mcg by mouth daily.    Historical Provider, MD    REVIEW OF SYSTEMS:  Review of Systems  Constitutional: Negative for fever, chills, weight loss and malaise/fatigue.  HENT: Negative for ear pain, hearing loss and tinnitus.   Eyes: Negative for blurred vision, double vision, pain and redness.  Respiratory: Negative for cough, hemoptysis and shortness of breath.   Cardiovascular: Negative for chest pain, palpitations, orthopnea and leg swelling.  Gastrointestinal: Positive for nausea, abdominal pain, diarrhea and blood in stool. Negative for vomiting and constipation.  Genitourinary: Negative for dysuria, frequency and hematuria.  Musculoskeletal: Positive for joint pain (right knee). Negative for back pain and neck pain.  Skin:       No acne, rash, or lesions  Neurological: Negative for dizziness, tremors, focal weakness and weakness.  Endo/Heme/Allergies: Negative for polydipsia. Does not bruise/bleed easily.  Psychiatric/Behavioral: Negative for depression. The patient is not nervous/anxious and does not have insomnia.      VITAL SIGNS:   Filed Vitals:   05/11/15 2140  BP: 148/78  Pulse: 94  Temp: 98.7 F (37.1 C)  TempSrc: Oral  Resp: 24  Height: 5\' 3"  (1.6 m)  Weight: 109.317 kg (241 lb)  SpO2: 95%   Wt Readings from Last 3 Encounters:  05/11/15 109.317 kg (241 lb)  04/26/15 108.41 kg (239 lb)  04/12/15 108.41 kg (239 lb)    PHYSICAL EXAMINATION:  Physical Exam  Vitals reviewed. Constitutional: She is oriented to person, place, and time. She appears well-developed and well-nourished. No distress.  HENT:  Head: Normocephalic and atraumatic.  Mouth/Throat: Oropharynx is clear and moist.  Eyes: Conjunctivae and EOM are normal. Pupils are equal, round, and reactive to light. No scleral icterus.   Neck: Normal range of motion. Neck supple. No JVD present. No thyromegaly present.  Cardiovascular: Normal rate, regular rhythm and intact distal pulses.  Exam reveals no gallop and no friction rub.   No murmur heard. Respiratory: Effort normal and breath sounds normal. No respiratory distress. She has no wheezes. She has no rales.  GI: Soft. Bowel sounds are normal. She exhibits no distension. There is tenderness (Epigastric).  Musculoskeletal: Normal range of motion. She exhibits no edema.  No arthritis, no gout  Lymphadenopathy:    She has no cervical adenopathy.  Neurological: She is alert and oriented to person, place, and time. No cranial nerve deficit.  No dysarthria, no aphasia  Skin: Skin is warm and dry. No rash noted. No erythema.  Psychiatric: Judgment and thought content normal.  Patient is very anxious, but has baseline anxiety.    LABORATORY PANEL:   CBC  Recent Labs Lab 05/11/15 2149  WBC 10.9  HGB 10.6*  HCT 32.3*  PLT 518*   ------------------------------------------------------------------------------------------------------------------  Chemistries   Recent Labs Lab 05/11/15 2149  NA 136  K 3.7  CL 108  CO2 19*  GLUCOSE 125*  BUN 15  CREATININE 1.19*  CALCIUM 8.5*  AST 20  ALT 16  ALKPHOS 109  BILITOT 0.4   ------------------------------------------------------------------------------------------------------------------  Cardiac Enzymes No results for input(s): TROPONINI in the last 168 hours. ------------------------------------------------------------------------------------------------------------------  RADIOLOGY:  No results found.  EKG:   Orders placed or performed in visit on 07/28/14  . EKG 12-Lead    IMPRESSION AND PLAN:  Principal Problem:   BRBPR (bright red blood per rectum) - unclear etiology, possibly upper GI bleed given her epigastric pain and use of NSAIDs, however unable to elucidate entirely this time based on  her history. GI consult. Patient has a significant number of allergies, so we will use Pepcid to help control stomach acid level at this time. Keep her nothing by mouth for any possibility of endoscopy or colonoscopy. Active Problems:   HTN (hypertension) - continue home meds, blood pressure goal is 160/100.   CAD (coronary artery disease) - continue home meds, currently no chest pain.   Anxiety - continue home medications for this.   GERD (gastroesophageal reflux disease) - Pepcid as above.   HLD (hyperlipidemia) - continue home dose statin  All the records are reviewed and case discussed with ED provider. Management plans discussed with the patient and/or family.  DVT PROPHYLAXIS: mechanical only  ADMISSION STATUS: Inpatient  CODE STATUS: Full Advance Directive Documentation        Most Recent Value   Type of Advance Directive  Living will   Pre-existing out of facility DNR order (yellow form or pink MOST form)     "MOST" Form in Place?        TOTAL TIME TAKING CARE OF THIS PATIENT: 45 minutes.    Sullivan Blasing FIELDING 05/11/2015, 11:57 PM  [facility]  Office  [phone]  CC: Primary care physician; Idelle Crouch, MD

## 2015-05-11 NOTE — ED Notes (Addendum)
Pt reports rectal bleeding starting this morning.  Pt had total knee replacement on 10/12.  Pt reports bright red blood, sample in cup, specimen thin in consistency.  Pt reports she is on lovenox.  Pt anxious upon assessment, respirations equal and unlabored, skin warm and dry.

## 2015-05-11 NOTE — ED Provider Notes (Signed)
[facility] Emergency Department Provider Note  ____________________________________________  Time seen: On arrival  I have reviewed the triage vital signs and the nursing notes.   HISTORY  Chief Complaint Rectal Bleeding    HPI Rachel Shaffer is a 64 y.o. female who presents with complaints of blood in stool this morning. She reports she had a total knee replacement approximately 2 weeks ago. She just took her last Lovenox shot last night. She had not had a significant bowel movement until this morning after which she noticed some mucousy discharge and bright red blood. She also began feeling nauseous after the bowel movement. She denies any new blood thinners. No history of rectal bleeding. No history of hemorrhoids. She reports mild abdominal discomfort diffusely     Past Medical History  Diagnosis Date  . Hypertension   . Acid reflux   . Arthritis   . Fibromyalgia   . DJD (degenerative joint disease)   . Sjogren's disease ([place])   . Vitamin D deficiency   . Folic acid deficiency   . Blood transfusion without reported diagnosis   . Osteoporosis   . Stroke ([place])   . Aortic regurgitation   . Cataract   . Anxiety   . Complication of anesthesia     spinal headache  blood patch  . PONV (postoperative nausea and vomiting)   . Spinal headache   . Family history of adverse reaction to anesthesia     sister scoliosis and trouble with some anesthesia  . Coronary artery disease   . Asthma   . Bronchitis, chronic ([place])   . DDD (degenerative disc disease), cervical     all of back  . Depression   . Pain     non cardiac chest pain  . Anemia   . Hypothyroidism   . Shortness of breath dyspnea     doe    Patient Active Problem List   Diagnosis Date Noted  . S/P total knee replacement 04/26/2015  . Lumbar facet arthropathy 01/17/2015  . Thoracic facet joint syndrome 01/17/2015  . DDD (degenerative disc disease), thoracic 01/17/2015  . DDD  (degenerative disc disease), lumbar 01/17/2015  . Bilateral occipital neuralgia 01/17/2015  . Migraine 01/17/2015    Past Surgical History  Procedure Laterality Date  . Appendectomy    . Abdominal hysterectomy    . Cholecystectomy    . Breast biopsy Left 1992    negative  . Knee arthroscopy Left   . Tonsillectomy  1959  . Oophorectomy    . Brain surgery      meningioma  . Colonoscopy    . Esophagogastroduodenoscopy    . Stomach surgery      gastroplasty  . Total knee arthroplasty Right 04/26/2015    Procedure: TOTAL KNEE ARTHROPLASTY;  Surgeon: Leanor Kail, MD;  Location: [facility] ORS;  Service: Orthopedics;  Laterality: Right;    Current Outpatient Rx  Name  Route  Sig  Dispense  Refill  . amLODipine (NORVASC) 5 MG tablet   Oral   Take 5 mg by mouth daily.         Marland Kitchen aspirin EC 81 MG tablet   Oral   Take 81 mg by mouth daily.         . Biotin 5000 MCG CAPS   Oral   Take 1 capsule by mouth daily.         . butalbital-acetaminophen-caffeine (FIORICET, ESGIC) 50-325-40 MG tablet   Oral   Take 2 tablets by  mouth every 4 (four) hours as needed.      0   . citalopram (CELEXA) 40 MG tablet   Oral   Take 20 mg by mouth 2 (two) times daily.          . Coenzyme Q10 (COQ-10) 400 MG CAPS   Oral   Take 400 mg by mouth daily.          Marland Kitchen enoxaparin (LOVENOX) 40 MG/0.4ML injection   Subcutaneous   Inject 0.4 mLs (40 mg total) into the skin daily.   14 Syringe   0   . Evening Primrose Oil 500 MG CAPS   Oral   Take by mouth daily.         . hydrOXYzine (ATARAX/VISTARIL) 25 MG tablet   Oral   Take 25 mg by mouth every 6 (six) hours as needed for anxiety or itching.          Marland Kitchen ibuprofen (ADVIL,MOTRIN) 800 MG tablet   Oral   Take 800 mg by mouth every 8 (eight) hours as needed for headache, mild pain or moderate pain.          Marland Kitchen lansoprazole (PREVACID) 30 MG capsule   Oral   Take 30 mg by mouth 2 (two) times daily.         Marland Kitchen lidocaine (LIDODERM)  5 %   Transdermal   Place 1 patch onto the skin daily. Remove & Discard patch within 12 hours or as directed by MD         . lisinopril (PRINIVIL,ZESTRIL) 40 MG tablet   Oral   Take 20 mg by mouth 2 (two) times daily.          Marland Kitchen LORazepam (ATIVAN) 2 MG tablet   Oral   Take 8 mg by mouth at bedtime. Takes 4 at bedtime         . polyethylene glycol (MIRALAX / GLYCOLAX) packet   Oral   Take 17 g by mouth daily as needed.         . promethazine (PHENERGAN) 25 MG tablet   Oral   Take 25 mg by mouth every 6 (six) hours as needed.          . propranolol ER (INDERAL LA) 60 MG 24 hr capsule   Oral   Take 60 mg by mouth daily.         Marland Kitchen sulfamethoxazole-trimethoprim (BACTRIM DS,SEPTRA DS) 800-160 MG tablet   Oral   Take 1 tablet by mouth 2 (two) times daily.         . diphenoxylate-atropine (LOMOTIL) 2.5-0.025 MG tablet   Oral   Take 1 tablet by mouth every 6 (six) hours as needed for diarrhea or loose stools.         . ferrous sulfate 325 (65 FE) MG tablet   Oral   Take 325 mg by mouth 2 (two) times daily with a meal.         . folic acid (FOLVITE) 1 MG tablet   Oral   Take 1 mg by mouth every morning.         . Ginkgo Biloba 40 MG CAPS   Oral   Take by mouth daily.         . magnesium oxide (MAG-OX) 400 MG tablet   Oral   Take 400 mg by mouth daily.         . meperidine (DEMEROL) 50 MG tablet   Oral   Take 1 tablet (50  mg total) by mouth every 4 (four) hours as needed for moderate pain or severe pain. Patient not taking: Reported on 05/11/2015   30 tablet   0   . Probiotic Product (ALIGN) 4 MG CAPS   Oral   Take 4 mg by mouth every morning.         . raloxifene (EVISTA) 60 MG tablet   Oral   Take 60 mg by mouth daily.         . Riboflavin (VITAMIN B-2 PO)   Oral   Take 1 tablet by mouth daily.         . simvastatin (ZOCOR) 40 MG tablet   Oral   Take 40 mg by mouth every evening.         . vitamin B-12 (CYANOCOBALAMIN) 1000  MCG tablet   Oral   Take 1,000 mcg by mouth daily.           Allergies Ceftin; Other; Cadmium; Cephalexin; Codeine; Contrast media; Depakote; Dilaudid; Feldene; Fosamax; Imitrex; Latex; Methotrexate derivatives; Morphine and related; Nexium; Nsaids; Nucynta; Percocet; Protonix; Relafen; Tegretol; Topamax; Trazodone and nefazodone; Vicodin; and Wellbutrin  Family History  Problem Relation Age of Onset  . Breast cancer Maternal Aunt 60  . Arthritis Mother   . Vision loss Mother   . Heart disease Mother   . Hypertension Mother   . Hyperlipidemia Mother   . Kidney disease Mother   . Stroke Father   . Asthma Father   . COPD Father   . Heart disease Father   . Hypertension Father   . Hyperlipidemia Father   . Kidney disease Father     Social History Social History  Substance Use Topics  . Smoking status: Former Smoker    Types: Cigarettes    Quit date: 04/12/1975  . Smokeless tobacco: None  . Alcohol Use: No    Review of Systems  Constitutional: Negative for fever. Eyes: Negative for visual changes. ENT: Negative for sore throat Cardiovascular: Negative for chest pain. Respiratory: Negative for shortness of breath. Gastrointestinal: Mild nausea Genitourinary: Negative for dysuria. Musculoskeletal: Negative for back pain. Skin: Negative for rash. Neurological: Negative for headaches or focal weakness Psychiatric: Significant anxiety    ____________________________________________   PHYSICAL EXAM:  VITAL SIGNS: ED Triage Vitals  Enc Vitals Group     BP 05/11/15 2140 148/78 mmHg     Pulse Rate 05/11/15 2140 94     Resp 05/11/15 2140 24     Temp 05/11/15 2140 98.7 F (37.1 C)     Temp Source 05/11/15 2140 Oral     SpO2 05/11/15 2140 95 %     Weight 05/11/15 2140 241 lb (109.317 kg)     Height 05/11/15 2140 5\' 3"  (1.6 m)     Head Cir --      Peak Flow --      Pain Score 05/11/15 2142 6     Pain Loc --      Pain Edu? --      Excl. in [place]? --       Constitutional: Alert and oriented. Well appearing and extremely anxious Eyes: Conjunctivae are normal.  ENT   Head: Normocephalic and atraumatic.   Mouth/Throat: Mucous membranes are moist. Cardiovascular: Normal rate, regular rhythm. Normal and symmetric distal pulses are present in all extremities. No murmurs, rubs, or gallops. Respiratory: Normal respiratory effort without tachypnea nor retractions. Breath sounds are clear and equal bilaterally.  Gastrointestinal: Soft and non-tender in all quadrants. No distention.  There is no CVA tenderness. Guaiac negative brown stool Genitourinary: deferred Musculoskeletal: Nontender with normal range of motion in all extremities. No lower extremity tenderness nor edema. Neurologic:  Normal speech and language. No gross focal neurologic deficits are appreciated. Skin:  Skin is warm, dry and intact. No rash noted. Psychiatric: Patient is extremely anxious making it difficult to obtain accurate history of present illness  ____________________________________________    LABS (pertinent positives/negatives)  Labs Reviewed  COMPREHENSIVE METABOLIC PANEL - Abnormal; Notable for the following:    CO2 19 (*)    Glucose, Bld 125 (*)    Creatinine, Ser 1.19 (*)    Calcium 8.5 (*)    Albumin 3.0 (*)    GFR calc non Af Amer 47 (*)    GFR calc Af Amer 54 (*)    All other components within normal limits  CBC - Abnormal; Notable for the following:    RBC 3.54 (*)    Hemoglobin 10.6 (*)    HCT 32.3 (*)    Platelets 518 (*)    All other components within normal limits  TYPE AND SCREEN    ____________________________________________   EKG  None  ____________________________________________    RADIOLOGY I have personally reviewed any xrays that were ordered on this patient: None  ____________________________________________   PROCEDURES  Procedure(s) performed: none  Critical Care performed:  none  ____________________________________________   INITIAL IMPRESSION / ASSESSMENT AND PLAN / ED COURSE  Pertinent labs & imaging results that were available during my care of the patient were reviewed by me and considered in my medical decision making (see chart for details).  Patient with small amount of bright red blood which she has brought in a cup that occurred this morning. Apparently she has had multiple trips to the toilet since then and has not had bleeding. On guaiac exam Brown stool that was negative. All vital signs are unremarkable. Her hemoglobin is 10.6 which is down only slightly from her post op hemoglobin.    ----------------------------------------- 11:32 PM on 05/11/2015 -----------------------------------------  Patient has passed more bright red blood on the bedpan approximately 10 cc worth. I'll discuss with hospitalist to evaluate for admission ____________________________________________   FINAL CLINICAL IMPRESSION(S) / ED DIAGNOSES  Final diagnoses:  Acute GI bleeding     Lavonia Drafts, MD 05/11/15 [phone]

## 2015-05-12 LAB — BASIC METABOLIC PANEL
Anion gap: 7 (ref 5–15)
BUN: 12 mg/dL (ref 6–20)
CHLORIDE: 109 mmol/L (ref 101–111)
CO2: 22 mmol/L (ref 22–32)
Calcium: 8.5 mg/dL — ABNORMAL LOW (ref 8.9–10.3)
Creatinine, Ser: 1.17 mg/dL — ABNORMAL HIGH (ref 0.44–1.00)
GFR calc Af Amer: 55 mL/min — ABNORMAL LOW (ref >60)
GFR calc non Af Amer: 47 mL/min — ABNORMAL LOW (ref >60)
GLUCOSE: 108 mg/dL — AB (ref 65–99)
POTASSIUM: 3.8 mmol/L (ref 3.5–5.1)
Sodium: 138 mmol/L (ref 135–145)

## 2015-05-12 LAB — CBC
HEMATOCRIT: 29.8 % — AB (ref 35.0–47.0)
Hemoglobin: 10.2 g/dL — ABNORMAL LOW (ref 12.0–16.0)
MCH: 31.7 pg (ref 26.0–34.0)
MCHC: 34.2 g/dL (ref 32.0–36.0)
MCV: 92.7 fL (ref 80.0–100.0)
Platelets: 455 10*3/uL — ABNORMAL HIGH (ref 150–440)
RBC: 3.22 MIL/uL — ABNORMAL LOW (ref 3.80–5.20)
RDW: 14 % (ref 11.5–14.5)
WBC: 9.7 10*3/uL (ref 3.6–11.0)

## 2015-05-12 MED ORDER — ASPIRIN EC 81 MG PO TBEC
81.0000 mg | DELAYED_RELEASE_TABLET | Freq: Every day | ORAL | Status: DC
  Administered 2015-05-13 – 2015-05-16 (×4): 81 mg via ORAL
  Filled 2015-05-12 (×6): qty 1

## 2015-05-12 MED ORDER — BUTALBITAL-APAP-CAFFEINE 50-325-40 MG PO TABS
2.0000 | ORAL_TABLET | Freq: Four times a day (QID) | ORAL | Status: DC | PRN
  Administered 2015-05-12 – 2015-05-16 (×2): 2 via ORAL
  Filled 2015-05-12 (×3): qty 2

## 2015-05-12 MED ORDER — PROPRANOLOL HCL ER 60 MG PO CP24
60.0000 mg | ORAL_CAPSULE | Freq: Every day | ORAL | Status: DC
  Administered 2015-05-13 – 2015-05-15 (×3): 60 mg via ORAL
  Filled 2015-05-12 (×5): qty 1

## 2015-05-12 MED ORDER — PROMETHAZINE HCL 25 MG/ML IJ SOLN: 12.5000 mg | Freq: Four times a day (QID) | INTRAMUSCULAR | Status: DC | PRN

## 2015-05-12 MED ORDER — RALOXIFENE HCL 60 MG PO TABS
60.0000 mg | ORAL_TABLET | Freq: Every day | ORAL | Status: DC
Start: 2015-05-12 — End: 2015-05-16
  Administered 2015-05-12 – 2015-05-16 (×5): 60 mg via ORAL
  Filled 2015-05-12 (×5): qty 1

## 2015-05-12 MED ORDER — CITALOPRAM HYDROBROMIDE 20 MG PO TABS
20.0000 mg | ORAL_TABLET | Freq: Two times a day (BID) | ORAL | Status: DC
  Administered 2015-05-12 – 2015-05-16 (×10): 20 mg via ORAL
  Filled 2015-05-12 (×10): qty 1

## 2015-05-12 MED ORDER — HYDROXYZINE HCL 25 MG PO TABS
25.0000 mg | ORAL_TABLET | Freq: Four times a day (QID) | ORAL | Status: DC | PRN
  Administered 2015-05-12: 25 mg via ORAL
  Filled 2015-05-12: qty 1

## 2015-05-12 MED ORDER — SODIUM CHLORIDE 0.9 % IJ SOLN
3.0000 mL | Freq: Two times a day (BID) | INTRAMUSCULAR | Status: DC
  Administered 2015-05-12 – 2015-05-15 (×4): 3 mL via INTRAVENOUS

## 2015-05-12 MED ORDER — ONDANSETRON HCL 4 MG PO TABS
4.0000 mg | ORAL_TABLET | Freq: Four times a day (QID) | ORAL | Status: DC | PRN
  Administered 2015-05-13 – 2015-05-15 (×3): 4 mg via ORAL
  Filled 2015-05-12 (×4): qty 1

## 2015-05-12 MED ORDER — PROMETHAZINE HCL 25 MG PO TABS
25.0000 mg | ORAL_TABLET | Freq: Three times a day (TID) | ORAL | Status: DC | PRN
  Administered 2015-05-12 – 2015-05-16 (×3): 25 mg via ORAL
  Filled 2015-05-12 (×3): qty 1

## 2015-05-12 MED ORDER — FAMOTIDINE 20 MG PO TABS
20.0000 mg | ORAL_TABLET | Freq: Two times a day (BID) | ORAL | Status: DC
  Administered 2015-05-12 – 2015-05-16 (×9): 20 mg via ORAL
  Filled 2015-05-12 (×10): qty 1

## 2015-05-12 MED ORDER — SIMVASTATIN 40 MG PO TABS
40.0000 mg | ORAL_TABLET | Freq: Every evening | ORAL | Status: DC
  Administered 2015-05-12 – 2015-05-15 (×4): 40 mg via ORAL
  Filled 2015-05-12 (×4): qty 1

## 2015-05-12 MED ORDER — OXYCODONE HCL 5 MG PO TABS
5.0000 mg | ORAL_TABLET | ORAL | Status: DC | PRN
  Administered 2015-05-12 – 2015-05-14 (×9): 5 mg via ORAL
  Filled 2015-05-12 (×9): qty 1

## 2015-05-12 MED ORDER — AMLODIPINE BESYLATE 5 MG PO TABS
5.0000 mg | ORAL_TABLET | Freq: Every day | ORAL | Status: DC
  Administered 2015-05-13: 5 mg via ORAL
  Filled 2015-05-12 (×6): qty 1

## 2015-05-12 MED ORDER — FAMOTIDINE 20 MG PO TABS: 20.0000 mg | ORAL_TABLET | Freq: Every day | ORAL | Status: DC

## 2015-05-12 MED ORDER — ONDANSETRON HCL 4 MG/2ML IJ SOLN
4.0000 mg | Freq: Four times a day (QID) | INTRAMUSCULAR | Status: DC | PRN
  Administered 2015-05-13: 4 mg via INTRAVENOUS
  Filled 2015-05-12 (×2): qty 2

## 2015-05-12 MED ORDER — LISINOPRIL 20 MG PO TABS
20.0000 mg | ORAL_TABLET | Freq: Two times a day (BID) | ORAL | Status: DC
  Administered 2015-05-12 – 2015-05-16 (×9): 20 mg via ORAL
  Filled 2015-05-12 (×10): qty 1

## 2015-05-12 MED ORDER — LORAZEPAM 2 MG PO TABS
4.0000 mg | ORAL_TABLET | Freq: Every day | ORAL | Status: DC
  Administered 2015-05-12 – 2015-05-15 (×5): 4 mg via ORAL
  Filled 2015-05-12 (×5): qty 2

## 2015-05-12 NOTE — ED Notes (Signed)
Pt is refusing to be put on the monitor.  Pt is stating that she is allergic to the adhesive.  Will transport of monitor.

## 2015-05-12 NOTE — ED Notes (Signed)
Attempted to call report

## 2015-05-12 NOTE — Progress Notes (Signed)
[facility]  Patient Status: Active (receiving services up to time of hospitalization)  [facility] is providing the following services: PT, OT  If patient discharges after hours, please call [phone].   Rachel Shaffer 05/12/2015, 12:24 PM

## 2015-05-12 NOTE — Progress Notes (Signed)
Rachel Shaffer is a 64 y.o. female  BRBPR (bright red blood per rectum)   SUBJECTIVE:   Pt admitted with diffuse abdominal pain and BRBPR. Still with diffuse pain. No fever. Denies CP or SOB. Hgb slightly down.  ______________________________________________________________________  ROS: Review of systems is unremarkable for any active cardiac,respiratory, GI, GU, hematologic, neurologic or psychiatric systems, 10 systems reviewed.  @CMEDLIST @  Past Medical History  Diagnosis Date  . Hypertension   . GERD (gastroesophageal reflux disease)   . Arthritis   . Fibromyalgia   . DJD (degenerative joint disease)   . Sjogren's disease ([place])   . Vitamin D deficiency   . Folic acid deficiency   . Blood transfusion without reported diagnosis   . Osteoporosis   . Stroke ([place])   . Aortic regurgitation   . Cataract   . Anxiety   . Complication of anesthesia     spinal headache  blood patch  . PONV (postoperative nausea and vomiting)   . Spinal headache   . Family history of adverse reaction to anesthesia     sister scoliosis and trouble with some anesthesia  . Coronary artery disease   . Asthma   . Bronchitis, chronic ([place])   . DDD (degenerative disc disease), cervical     all of back  . Depression   . Pain     non cardiac chest pain  . Anemia   . Hypothyroidism     Past Surgical History  Procedure Laterality Date  . Appendectomy    . Abdominal hysterectomy    . Cholecystectomy    . Breast biopsy Left 1992    negative  . Knee arthroscopy Left   . Tonsillectomy  1959  . Oophorectomy    . Brain surgery      meningioma  . Colonoscopy    . Esophagogastroduodenoscopy    . Stomach surgery      gastroplasty  . Total knee arthroplasty Right 04/26/2015    Procedure: TOTAL KNEE ARTHROPLASTY;  Surgeon: Leanor Kail, MD;  Location: [facility] ORS;  Service: Orthopedics;  Laterality: Right;    PHYSICAL EXAM:  BP 122/60 mmHg  Pulse 90  Temp(Src) 98.5 F (36.9 C) (Oral)  Resp  18  Ht 5\' 3"  (1.6 m)  Wt 109.317 kg (241 lb)  BMI 42.70 kg/m2  SpO2 94%  Wt Readings from Last 3 Encounters:  05/11/15 109.317 kg (241 lb)  04/26/15 108.41 kg (239 lb)  04/12/15 108.41 kg (239 lb)            Constitutional: NAD Neck: supple, no thyromegaly Respiratory: CTA, no rales or wheezes Cardiovascular: RRR, no murmur, no gallop Abdomen: soft, good BS, diffusely tender without rebound Extremities: trace edema Neuro: alert and oriented, no focal motor or sensory deficits  ASSESSMENT/PLAN:  Labs and imaging studies were reviewed  GI consult pending. Daily labs. O/w no change at this time.

## 2015-05-12 NOTE — Progress Notes (Signed)
Patient arrived to unit from ED 0040. Patient alert and oriented. Patient very anxious and expresses conercens.Anxiety controlled with scheduled medication. Skin see flow sheet. Patient voided no blood present. Patient denies pain. Cardiac monitoring placed.

## 2015-05-12 NOTE — Progress Notes (Signed)
Patient ID: Rachel Shaffer, female   DOB: 1951/01/10, 64 y.o.   MRN: [id number] Pt seen and examined. Please see C. Celesta Aver' notes. Pt who is s/p right knee surgery on lovenox and increasing NSAIDS at rehab. Now with BRBPR. Had normal colonoscopy in 2013. Had normal EGD in 2013 but developed food bolus in 2014, that required food disimpaction and later esophageal dilation. Had one episode of vomiting before admission as well as occasional dysphagia since 2014. Did not IV access earlier. Now had PICC placed this afternoon. Start clears today. Though some of symptoms are UGI in nature, BRBPR suggests suggests colonic origin. IF more bleeding occurs, then will consider repeating endoscopies. However, if no more bleeding, then continue to advance diet please. Thanks.

## 2015-05-12 NOTE — Consult Note (Signed)
GI Inpatient Consult Note  Reason for Consult: BRBPR   Attending Requesting Consult: Dr. Doy Hutching  History of Present Illness: Rachel Shaffer is a 64 y.o. female who has a significant pmh of chronic NSAID use, GERD, anemia, chronic pain and recent knee surgery presents with brbpr for one day.  She reports that she had knee surgery in the middle of October 2016, went to [facility] for rehab, just came home on Wednesday.  She reports Wednesday evening she took some Colace because she had not had a proper bowel movement due to pain medication.  Reports she got up the next morning had some grits and orange juice, immediately afterwards began to have abdominal cramping.  Went to use the bathroom, became nauseous and vomited undigested food.  She reports after that she had a large bowel movement which she had to strain at the beginning, she then became lightheaded and had chills.  She reports approximately 30 minutes later she had an episode of diarrhea.  She then reports that for the next 8 hours she had a small episode every thirty minutes that each time started as expelling some gas and then a quarter sized amount of a blood tinged mucus.  At approximately 8:30 a.m.  she reports she felt blood dripping and reports it was bright red blood.  She reports she collected it in a red solo cup andit appeared to be approximately half an inch of blood.  She then presented to the emergency department were she had 1 more episode of  bright red blood.  She reports experiencing acid reflux the night before this occurred, along with feeling like a pill was stuck in her esophagus.  She reports prior to this her bowel movements are generally loose to diarrhea, commented that this has been chronic for her.  She does report a small bowel movement today, describes it as loose, no blood.  She has been taking ibuprofen 800 mg 2 times a day for many years.  While she was at [facility] she was taking a third each day for the last 10  days.  She also just finished up her Lovenox yesterday.  She is allergic to many medications including Nexium and Protonix, she has been taking Prevacid twice a day for many years.  She does endorse a history of H pylori.  She has a history of blood transfusions, 1 in 1998 after hysterectomy, and then another 1 in 2006 following brain surgery.  She had a normal colonoscopy and upper endoscopy completed in July, 2013 by Dr. Davonna Belling.  In June of 2014 she had another upper endoscopy with Dr. Allen Norris after having a piece of food stuck.  She was dilated at that time after finding a mild stenosis, a small hiatus hernia was present, and localized mild inflammation was found in the gastric antrum.  Past Medical History:  Past Medical History  Diagnosis Date  . Hypertension   . GERD (gastroesophageal reflux disease)   . Arthritis   . Fibromyalgia   . DJD (degenerative joint disease)   . Sjogren's disease ([place])   . Vitamin D deficiency   . Folic acid deficiency   . Blood transfusion without reported diagnosis   . Osteoporosis   . Stroke ([place])   . Aortic regurgitation   . Cataract   . Anxiety   . Complication of anesthesia     spinal headache  blood patch  . PONV (postoperative nausea and vomiting)   . Spinal headache   .  Family history of adverse reaction to anesthesia     sister scoliosis and trouble with some anesthesia  . Coronary artery disease   . Asthma   . Bronchitis, chronic ([place])   . DDD (degenerative disc disease), cervical     all of back  . Depression   . Pain     non cardiac chest pain  . Anemia   . Hypothyroidism     Problem List: Patient Active Problem List   Diagnosis Date Noted  . HTN (hypertension) 05/11/2015  . CAD (coronary artery disease) 05/11/2015  . Anxiety 05/11/2015  . GERD (gastroesophageal reflux disease) 05/11/2015  . BRBPR (bright red blood per rectum) 05/11/2015  . HLD (hyperlipidemia) 05/11/2015  . S/P total knee replacement 04/26/2015  . Lumbar  facet arthropathy 01/17/2015  . Thoracic facet joint syndrome 01/17/2015  . DDD (degenerative disc disease), thoracic 01/17/2015  . DDD (degenerative disc disease), lumbar 01/17/2015  . Bilateral occipital neuralgia 01/17/2015  . Migraine 01/17/2015    Past Surgical History: Past Surgical History  Procedure Laterality Date  . Appendectomy    . Abdominal hysterectomy    . Cholecystectomy    . Breast biopsy Left 1992    negative  . Knee arthroscopy Left   . Tonsillectomy  1959  . Oophorectomy    . Brain surgery      meningioma  . Colonoscopy    . Esophagogastroduodenoscopy    . Stomach surgery      gastroplasty  . Total knee arthroplasty Right 04/26/2015    Procedure: TOTAL KNEE ARTHROPLASTY;  Surgeon: Leanor Kail, MD;  Location: [facility] ORS;  Service: Orthopedics;  Laterality: Right;    Allergies: Allergies  Allergen Reactions  . Ceftin [Cefuroxime] Anaphylaxis  . Other Anaphylaxis and Swelling    Kiwi, black walnuts, pineapple, nuts.    . Cadmium   . Cephalexin   . Codeine   . Contrast Media [Iodinated Diagnostic Agents] Hives    Patient denies  . Depakote [Divalproex Sodium]   . Dilaudid [Hydromorphone Hcl] Other (See Comments)    hallucinations  . Feldene [Piroxicam]   . Fosamax [Alendronate] Nausea And Vomiting  . Imitrex [Sumatriptan] Other (See Comments)    Heart beats went up.  . Latex   . Methotrexate Derivatives Other (See Comments)    Gi bleeding  . Morphine And Related Nausea And Vomiting  . Nexium [Esomeprazole Magnesium]   . Nsaids Itching    Except for ibuprofen Bumps.  Gean [place]ch[place] [Tapentadol] Swelling    Throat,tongue  . Percocet [Oxycodone-Acetaminophen] Itching  . Protonix [Pantoprazole Sodium]   . Relafen [Nabumetone]   . Tegretol [Carbamazepine] Other (See Comments)    Aplastic anemia  . Topamax [Topiramate]   . Trazodone And Nefazodone   . Vicodin [Hydrocodone-Acetaminophen] Itching  . Wellbutrin [Bupropion]     Home  Medications: Prescriptions prior to admission  Medication Sig Dispense Refill Last Dose  . amLODipine (NORVASC) 5 MG tablet Take 5 mg by mouth daily.   05/11/2015 at Unknown time  . aspirin EC 81 MG tablet Take 81 mg by mouth daily.   05/11/2015 at Unknown time  . Biotin 5000 MCG CAPS Take 1 capsule by mouth daily.   05/11/2015 at Unknown time  . butalbital-acetaminophen-caffeine (FIORICET, ESGIC) 50-325-40 MG tablet Take 2 tablets by mouth every 4 (four) hours as needed.  0 PRN at Unknown time  . citalopram (CELEXA) 40 MG tablet Take 20 mg by mouth 2 (two) times daily.    05/11/2015 at Unknown  time  . Coenzyme Q10 (COQ-10) 400 MG CAPS Take 400 mg by mouth daily.    05/11/2015 at Unknown time  . enoxaparin (LOVENOX) 40 MG/0.4ML injection Inject 0.4 mLs (40 mg total) into the skin daily. 14 Syringe 0 05/11/2015 at Unknown time  . Evening Primrose Oil 500 MG CAPS Take by mouth daily.   05/11/2015 at Unknown time  . hydrOXYzine (ATARAX/VISTARIL) 25 MG tablet Take 25 mg by mouth every 6 (six) hours as needed for anxiety or itching.    05/11/2015 at Unknown time  . ibuprofen (ADVIL,MOTRIN) 800 MG tablet Take 800 mg by mouth every 8 (eight) hours as needed for headache, mild pain or moderate pain.    05/11/2015 at Unknown time  . lansoprazole (PREVACID) 30 MG capsule Take 30 mg by mouth 2 (two) times daily.   05/11/2015 at Unknown time  . lidocaine (LIDODERM) 5 % Place 1 patch onto the skin daily. Remove & Discard patch within 12 hours or as directed by MD   05/11/2015 at Unknown time  . lisinopril (PRINIVIL,ZESTRIL) 40 MG tablet Take 20 mg by mouth 2 (two) times daily.    05/11/2015 at Unknown time  . LORazepam (ATIVAN) 2 MG tablet Take 8 mg by mouth at bedtime. Takes 4 at bedtime   05/11/2015 at Unknown time  . polyethylene glycol (MIRALAX / GLYCOLAX) packet Take 17 g by mouth daily as needed.   prn  . promethazine (PHENERGAN) 25 MG tablet Take 25 mg by mouth every 6 (six) hours as needed.     05/11/2015 at Unknown time  . propranolol ER (INDERAL LA) 60 MG 24 hr capsule Take 60 mg by mouth daily.   05/11/2015 at Unknown time  . sulfamethoxazole-trimethoprim (BACTRIM DS,SEPTRA DS) 800-160 MG tablet Take 1 tablet by mouth 2 (two) times daily.   05/11/2015 at Unknown time  . diphenoxylate-atropine (LOMOTIL) 2.5-0.025 MG tablet Take 1 tablet by mouth every 6 (six) hours as needed for diarrhea or loose stools.   Not Taking at Unknown time  . ferrous sulfate 325 (65 FE) MG tablet Take 325 mg by mouth 2 (two) times daily with a meal.   Not Taking at Unknown time  . folic acid (FOLVITE) 1 MG tablet Take 1 mg by mouth every morning.   Not Taking at Unknown time  . Ginkgo Biloba 40 MG CAPS Take by mouth daily.   Not Taking at Unknown time  . magnesium oxide (MAG-OX) 400 MG tablet Take 400 mg by mouth daily.   Not Taking at Unknown time  . meperidine (DEMEROL) 50 MG tablet Take 1 tablet (50 mg total) by mouth every 4 (four) hours as needed for moderate pain or severe pain. (Patient not taking: Reported on 05/11/2015) 30 tablet 0 Not Taking at Unknown time  . Probiotic Product (ALIGN) 4 MG CAPS Take 4 mg by mouth every morning.   Not Taking at Unknown time  . raloxifene (EVISTA) 60 MG tablet Take 60 mg by mouth daily.   Not Taking at Unknown time  . Riboflavin (VITAMIN B-2 PO) Take 1 tablet by mouth daily.   Not Taking at Unknown time  . simvastatin (ZOCOR) 40 MG tablet Take 40 mg by mouth every evening.   Not Taking at Unknown time  . vitamin B-12 (CYANOCOBALAMIN) 1000 MCG tablet Take 1,000 mcg by mouth daily.   Not Taking at Unknown time   Home medication reconciliation was completed with the patient.   Scheduled Inpatient Medications:   . amLODipine  5 mg Oral Daily  . aspirin EC  81 mg Oral Daily  . citalopram  20 mg Oral BID  . famotidine  20 mg Oral BID  . lisinopril  20 mg Oral BID  . LORazepam  4 mg Oral QHS  . propranolol ER  60 mg Oral Daily  . raloxifene  60 mg Oral Daily  .  simvastatin  40 mg Oral QPM  . sodium chloride  3 mL Intravenous Q12H    Continuous Inpatient Infusions:     PRN Inpatient Medications:  hydrOXYzine, ondansetron **OR** ondansetron (ZOFRAN) IV, oxyCODONE, promethazine  Family History: family history includes Arthritis in her mother; Asthma in her father; Breast cancer (age of onset: 61) in her maternal aunt; COPD in her father; Heart disease in her father and mother; Hyperlipidemia in her father and mother; Hypertension in her father and mother; Kidney disease in her father and mother; Stroke in her father; Vision loss in her mother.    Social History:   reports that she quit smoking about 40 years ago. Her smoking use included Cigarettes. She does not have any smokeless tobacco history on file. She reports that she does not drink alcohol or use illicit drugs.   Review of Systems: Constitutional: Weight is stable.  Eyes: No changes in vision. ENT: No oral lesions, sore throat.  GI: see HPI.  Heme/Lymph: No easy bruising.  CV: No chest pain.  GU: No hematuria.  Integumentary: No rashes.  Neuro: No headaches.  Psych: No depression/anxiety.  Endocrine: No heat/cold intolerance.  Allergic/Immunologic: No urticaria.  Resp: No cough, SOB.  Musculoskeletal: No joint swelling.    Physical Examination: BP 118/53 mmHg  Pulse 85  Temp(Src) 98.4 F (36.9 C) (Oral)  Resp 16  Ht 5\' 3"  (1.6 m)  Wt 109.317 kg (241 lb)  BMI 42.70 kg/m2  SpO2 95% Gen: NAD, alert and oriented x 4 HEENT: PEERLA, EOMI, Neck: supple, no JVD or thyromegaly Chest: CTA bilaterally, no wheezes, crackles, or other adventitious sounds CV: RRR, no m/g/c/r Abd: soft, NT, ND, +BS in all four quadrants; no HSM, guarding, ridigity, or rebound tenderness Ext: no edema, well perfused with 2+ pulses, Skin: tape/gauze on area of stiches by right knee Lymph: no LAD  Data: Lab Results  Component Value Date   WBC 9.7 05/12/2015   HGB 10.2* 05/12/2015   HCT 29.8*  05/12/2015   MCV 92.7 05/12/2015   PLT 455* 05/12/2015    Recent Labs Lab 05/11/15 2149 05/12/15 0711  HGB 10.6* 10.2*   Lab Results  Component Value Date   NA 138 05/12/2015   K 3.8 05/12/2015   CL 109 05/12/2015   CO2 22 05/12/2015   BUN 12 05/12/2015   CREATININE 1.17* 05/12/2015   Lab Results  Component Value Date   ALT 16 05/11/2015   AST 20 05/11/2015   ALKPHOS 109 05/11/2015   BILITOT 0.4 05/11/2015   No results for input(s): APTT, INR, PTT in the last 168 hours.   Assessment/Plan: Ms. Solazzo is a 64 y.o. female with recent knee surgery, just completed Lovenox, on chronic NSAIDS (800mg  ibuprofen BID with recent increase to TID for past 10 days) with brbpr, one episode of vomiting undigested food and slight cramping prior to bm's.  Hgb on admission 10.6, currently 102.  Last reported hgb was 04/28/2015 was 11.6.  BUN/creatinine 12/1.17.  On Pepcid due to allergies.  Recommendations: We will continue to watch her and if she has any further bleeding, we will consider  endoscopy for evaluation.  She will need IV access.  It is fine for her to have clear liquids at this time.   Thank you for the consult. Please call with questions or concerns.  Salvadore Farber, PA-C  I personally performed these services.

## 2015-05-13 ENCOUNTER — Encounter: Payer: Self-pay | Admitting: Gastroenterology

## 2015-05-13 LAB — CBC WITH DIFFERENTIAL/PLATELET
Basophils Absolute: 0.1 10*3/uL (ref 0–0.1)
Basophils Relative: 1 %
EOS PCT: 5 %
Eosinophils Absolute: 0.5 10*3/uL (ref 0–0.7)
HCT: 29 % — ABNORMAL LOW (ref 35.0–47.0)
Hemoglobin: 9.6 g/dL — ABNORMAL LOW (ref 12.0–16.0)
LYMPHS ABS: 1.6 10*3/uL (ref 1.0–3.6)
LYMPHS PCT: 18 %
MCH: 30.6 pg (ref 26.0–34.0)
MCHC: 33 g/dL (ref 32.0–36.0)
MCV: 92.7 fL (ref 80.0–100.0)
MONO ABS: 0.8 10*3/uL (ref 0.2–0.9)
MONOS PCT: 9 %
Neutro Abs: 5.9 10*3/uL (ref 1.4–6.5)
Neutrophils Relative %: 67 %
PLATELETS: 415 10*3/uL (ref 150–440)
RBC: 3.13 MIL/uL — AB (ref 3.80–5.20)
RDW: 13.7 % (ref 11.5–14.5)
WBC: 8.9 10*3/uL (ref 3.6–11.0)

## 2015-05-13 LAB — BASIC METABOLIC PANEL
Anion gap: 5 (ref 5–15)
BUN: 9 mg/dL (ref 6–20)
CO2: 23 mmol/L (ref 22–32)
Calcium: 8.2 mg/dL — ABNORMAL LOW (ref 8.9–10.3)
Chloride: 111 mmol/L (ref 101–111)
Creatinine, Ser: 1.08 mg/dL — ABNORMAL HIGH (ref 0.44–1.00)
GFR calc Af Amer: >60 mL/min (ref >60)
GFR, EST NON AFRICAN AMERICAN: 52 mL/min — AB (ref >60)
GLUCOSE: 88 mg/dL (ref 65–99)
POTASSIUM: 3.6 mmol/L (ref 3.5–5.1)
Sodium: 139 mmol/L (ref 135–145)

## 2015-05-13 MED ORDER — DIPHENHYDRAMINE HCL 25 MG PO CAPS
25.0000 mg | ORAL_CAPSULE | Freq: Four times a day (QID) | ORAL | Status: DC | PRN
  Administered 2015-05-13 – 2015-05-14 (×3): 25 mg via ORAL
  Filled 2015-05-13 (×3): qty 1

## 2015-05-13 MED ORDER — METAXALONE 400 MG HALF TABLET
400.0000 mg | ORAL_TABLET | Freq: Three times a day (TID) | ORAL | Status: DC
  Administered 2015-05-13 – 2015-05-15 (×4): 400 mg via ORAL
  Filled 2015-05-13 (×11): qty 1

## 2015-05-13 MED ORDER — METAXALONE 800 MG PO TABS
800.0000 mg | ORAL_TABLET | Freq: Three times a day (TID) | ORAL | Status: DC
  Administered 2015-05-13: 800 mg via ORAL
  Filled 2015-05-13 (×3): qty 1

## 2015-05-13 NOTE — Progress Notes (Signed)
Migraine headache

## 2015-05-13 NOTE — Progress Notes (Signed)
Pt IV site infiltrated today. Pt refuses to have another IV placed

## 2015-05-13 NOTE — Evaluation (Signed)
Physical Therapy Evaluation Patient Details Name: ZOYE CHANDRA MRN: [id number] DOB: August 03, 1950 Today's Date: 05/13/2015   History of Present Illness  Pt was admitted to [facility] due to BPBPR. She underwent R TKA at [facility] on 04/26/15. She returned home from SNF the day before admission to the hospital. [facility] PT performed evaluation but no follow-up appointments.  Clinical Impression  Pt is demonstrating excellent progress s/p R TKR. R knee AAROM is progressing and pt appears to be primarily limited by pain. Pt is modified independent for bed mobility and transfers with good weight acceptance and strength noted. Slight decreased in stance time on RLE and L step length during ambulation. Gait speed is decreased but functional for limited household distances. Pt has adequate support at home and will need to resume [facility] PT upon discharge. Pt will benefit from skilled PT services to address deficits in strength, balance, and mobility in order to return to full function at home.     Follow Up Recommendations Home health PT (Resume HH PT at discharge)    Equipment Recommendations  None recommended by PT    Recommendations for Other Services       Precautions / Restrictions Precautions Precautions: Fall Restrictions Weight Bearing Restrictions: No RLE Weight Bearing: Weight bearing as tolerated      Mobility  Bed Mobility Overal bed mobility: Modified Independent Bed Mobility: Supine to Sit;Sit to Supine     Supine to sit: Modified independent (Device/Increase time) Sit to supine: Modified independent (Device/Increase time)   General bed mobility comments: Use of bed rails and HOB elevated. Uses overhead trapeze to scoot up toward [place]. Good strength and sequencing noted  Transfers Overall transfer level: Needs assistance Equipment used: Rolling walker (2 wheeled) Transfers: Sit to/from Stand Sit to Stand: Modified independent (Device/Increase time)         General transfer comment: Good  strength noted during transfer. Pt does tend to want to pull up on walker. Good weight acceptance to RLE and good R knee flexion  Ambulation/Gait Ambulation/Gait assistance: Supervision Ambulation Distance (Feet): 30 Feet Assistive device: Rolling walker (2 wheeled) Gait Pattern/deviations: Step-through pattern;Decreased step length - left;Decreased stance time - right Gait velocity: decreased Gait velocity interpretation: <1.8 ft/sec, indicative of risk for recurrent falls General Gait Details: Pt demonstrates good sequencing with walker without instruction. Decreased step length on LLE but still reciprocal pattern demonstrated. Pt fatigues quickly and defers further ambulation due to fatigue. Pt requests return to bed instead of recliner  Stairs            Wheelchair Mobility    Modified Rankin (Stroke Patients Only)       Balance Overall balance assessment: No apparent balance deficits (not formally assessed)                                           Pertinent Vitals/Pain Pain Assessment: 0-10 Pain Score: 0-No pain Pain Intervention(s): Premedicated before session    Home Living Family/patient expects to be discharged to:: Private residence Living Arrangements: Spouse/significant other Available Help at Discharge: Family;Available 24 hours/day Type of Home: House Home Access: Level entry     Home Layout: One level Home Equipment: Walker - 2 wheels;Shower seat - built in;Grab bars - tub/shower;Grab bars - toilet;Cane - single point      Prior Function Level of Independence: Independent with assistive device(s) (prior to knee surgery, spc,  after surgery RW)         Comments: Assist with ADLs prior to and following surgery     Hand Dominance        Extremity/Trunk Assessment   Upper Extremity Assessment: Overall WFL for tasks assessed           Lower Extremity Assessment: RLE deficits/detail RLE Deficits / Details:  (LLE grossly  WFL, RLE: at least 4+/5 throughout,)       Communication   Communication: No difficulties  Cognition Arousal/Alertness: Awake/alert Behavior During Therapy: WFL for tasks assessed/performed Overall Cognitive Status: Within Functional Limits for tasks assessed                      General Comments      Exercises Total Joint Exercises Ankle Circles/Pumps: Strengthening;Both;10 reps;Supine Quad Sets: Strengthening;Both;10 reps;Supine Gluteal Sets: Strengthening;Both;10 reps;Supine Hip ABduction/ADduction: Strengthening;Both;10 reps;Supine Straight Leg Raises: Strengthening;Both;10 reps;Supine Goniometric ROM: 0-94      Assessment/Plan    PT Assessment Patient needs continued PT services  PT Diagnosis Difficulty walking;Abnormality of gait;Generalized weakness   PT Problem List Decreased strength;Decreased range of motion;Decreased activity tolerance;Decreased mobility;Pain  PT Treatment Interventions DME instruction;Gait training;Stair training;Therapeutic activities;Therapeutic exercise;Balance training;Neuromuscular re-education;Manual techniques   PT Goals (Current goals can be found in the Care Plan section) Acute Rehab PT Goals Patient Stated Goal: "I want to get stronger and don't want to lose range of motion in my knee" PT Goal Formulation: With patient/family Time For Goal Achievement: 05/27/15 Potential to Achieve Goals: Good    Frequency Min 2X/week   Barriers to discharge        Co-evaluation               End of Session Equipment Utilized During Treatment: Gait belt Activity Tolerance: Patient limited by fatigue Patient left: in bed;with bed alarm set;with call bell/phone within reach;with family/visitor present;with SCD's reapplied (pillow under heels for R knee extension)           Time: 1110-1130 PT Time Calculation (min) (ACUTE ONLY): 20 min   Charges:   PT Evaluation $Initial PT Evaluation Tier I: 1 Procedure PT  Treatments $Therapeutic Exercise: 8-22 mins   PT G Codes:       Lyndel Safe Jakerria Kingbird PT, DPT   Farida Mcreynolds 05/13/2015, 11:49 AM

## 2015-05-13 NOTE — Progress Notes (Signed)
Contacted MD to get order for med patient takes for migraines

## 2015-05-13 NOTE — Consult Note (Signed)
  GI Inpatient Follow-up Note  Patient Identification: Rachel Shaffer is a 64 y.o. female  Subjective: Feels gassy and bloated, but no further rectal bleeding. Some drop in hgb.  Scheduled Inpatient Medications:  . amLODipine  5 mg Oral Daily  . aspirin EC  81 mg Oral Daily  . citalopram  20 mg Oral BID  . famotidine  20 mg Oral BID  . lisinopril  20 mg Oral BID  . LORazepam  4 mg Oral QHS  . metaxalone  800 mg Oral TID  . propranolol ER  60 mg Oral Daily  . raloxifene  60 mg Oral Daily  . simvastatin  40 mg Oral QPM  . sodium chloride  3 mL Intravenous Q12H    Continuous Inpatient Infusions:     PRN Inpatient Medications:  butalbital-acetaminophen-caffeine, hydrOXYzine, ondansetron **OR** ondansetron (ZOFRAN) IV, oxyCODONE, promethazine, promethazine  Review of Systems: Constitutional: Weight is stable.  Eyes: No changes in vision. ENT: No oral lesions, sore throat.  GI: see HPI.  Heme/Lymph: No easy bruising.  CV: No chest pain.  GU: No hematuria.  Integumentary: No rashes.  Neuro: No headaches.  Psych: No depression/anxiety.  Endocrine: No heat/cold intolerance.  Allergic/Immunologic: No urticaria.  Resp: No cough, SOB.  Musculoskeletal: No joint swelling.    Physical Examination: BP 120/57 mmHg  Pulse 78  Temp(Src) 98 F (36.7 C) (Oral)  Resp 16  Ht 5\' 3"  (1.6 m)  Wt 109.317 kg (241 lb)  BMI 42.70 kg/m2  SpO2 95% Gen: NAD, alert and oriented x 4 HEENT: PEERLA, EOMI, Neck: supple, no JVD or thyromegaly Chest: CTA bilaterally, no wheezes, crackles, or other adventitious sounds CV: RRR, no m/g/c/r Abd: soft, NT, mild abdominal distension, +BS in all four quadrants; no HSM, guarding, ridigity, or rebound tenderness Ext: no edema, well perfused with 2+ pulses, Skin: no rash or lesions noted Lymph: no LAD  Data: Lab Results  Component Value Date   WBC 8.9 05/13/2015   HGB 9.6* 05/13/2015   HCT 29.0* 05/13/2015   MCV 92.7 05/13/2015   PLT 415  05/13/2015    Recent Labs Lab 05/11/15 2149 05/12/15 0711 05/13/15 0327  HGB 10.6* 10.2* 9.6*   Lab Results  Component Value Date   NA 139 05/13/2015   K 3.6 05/13/2015   CL 111 05/13/2015   CO2 23 05/13/2015   BUN 9 05/13/2015   CREATININE 1.08* 05/13/2015   Lab Results  Component Value Date   ALT 16 05/11/2015   AST 20 05/11/2015   ALKPHOS 109 05/11/2015   BILITOT 0.4 05/11/2015   No results for input(s): APTT, INR, PTT in the last 168 hours. Assessment/Plan: Ms. Hark is a 64 y.o. female with GI bleeding. None since admission.  Recommendations: Will moniter hgb. If falls further, then will prep pt for endoscopies on Monday. Please call with questions or concerns.  Gaines Cartmell, Lupita Dawn, MD

## 2015-05-13 NOTE — Progress Notes (Signed)
Rachel Shaffer is a 64 y.o. female  BRBPR (bright red blood per rectum)   SUBJECTIVE:  Pt in NAD. Denies abdominal pain. No further bleeding. Hgb 9.6 this AM. GI consult noted.  ______________________________________________________________________  ROS: Review of systems is unremarkable for any active cardiac,respiratory, GI, GU, hematologic, neurologic or psychiatric systems, 10 systems reviewed.  @CMEDLIST @  Past Medical History  Diagnosis Date  . Hypertension   . GERD (gastroesophageal reflux disease)   . Arthritis   . Fibromyalgia   . DJD (degenerative joint disease)   . Sjogren's disease ([place])   . Vitamin D deficiency   . Folic acid deficiency   . Blood transfusion without reported diagnosis   . Osteoporosis   . Stroke ([place])   . Aortic regurgitation   . Cataract   . Anxiety   . Complication of anesthesia     spinal headache  blood patch  . PONV (postoperative nausea and vomiting)   . Spinal headache   . Family history of adverse reaction to anesthesia     sister scoliosis and trouble with some anesthesia  . Coronary artery disease   . Asthma   . Bronchitis, chronic ([place])   . DDD (degenerative disc disease), cervical     all of back  . Depression   . Pain     non cardiac chest pain  . Anemia   . Hypothyroidism     Past Surgical History  Procedure Laterality Date  . Appendectomy    . Abdominal hysterectomy    . Cholecystectomy    . Breast biopsy Left 1992    negative  . Knee arthroscopy Left   . Tonsillectomy  1959  . Oophorectomy    . Brain surgery      meningioma  . Colonoscopy    . Esophagogastroduodenoscopy    . Stomach surgery      gastroplasty  . Total knee arthroplasty Right 04/26/2015    Procedure: TOTAL KNEE ARTHROPLASTY;  Surgeon: Leanor Kail, MD;  Location: [facility] ORS;  Service: Orthopedics;  Laterality: Right;    PHYSICAL EXAM:  BP 128/80 mmHg  Pulse 94  Temp(Src) 98.5 F (36.9 C) (Oral)  Resp 20  Ht 5\' 3"  (1.6 m)  Wt 109.317  kg (241 lb)  BMI 42.70 kg/m2  SpO2 95%  Wt Readings from Last 3 Encounters:  05/11/15 109.317 kg (241 lb)  04/26/15 108.41 kg (239 lb)  04/12/15 108.41 kg (239 lb)            Constitutional: NAD Neck: supple, no thyromegaly Respiratory: CTA, no rales or wheezes Cardiovascular: RRR, no murmur, no gallop Abdomen: soft, good BS, nontender Extremities: 1+ edema Neuro: alert and oriented, no focal motor or sensory deficits  ASSESSMENT/PLAN:  Labs and imaging studies were reviewed  Will advance diet. PT and CM consults today. Repeat labs in Am.

## 2015-05-14 LAB — CBC WITH DIFFERENTIAL/PLATELET
BASOS ABS: 0.1 10*3/uL (ref 0–0.1)
Basophils Relative: 1 %
EOS PCT: 6 %
Eosinophils Absolute: 0.5 10*3/uL (ref 0–0.7)
HEMATOCRIT: 28.1 % — AB (ref 35.0–47.0)
Hemoglobin: 9.2 g/dL — ABNORMAL LOW (ref 12.0–16.0)
LYMPHS ABS: 1.5 10*3/uL (ref 1.0–3.6)
LYMPHS PCT: 17 %
MCH: 30.1 pg (ref 26.0–34.0)
MCHC: 32.7 g/dL (ref 32.0–36.0)
MCV: 92.2 fL (ref 80.0–100.0)
MONO ABS: 0.9 10*3/uL (ref 0.2–0.9)
MONOS PCT: 10 %
NEUTROS ABS: 6.2 10*3/uL (ref 1.4–6.5)
Neutrophils Relative %: 66 %
PLATELETS: 414 10*3/uL (ref 150–440)
RBC: 3.05 MIL/uL — ABNORMAL LOW (ref 3.80–5.20)
RDW: 13.9 % (ref 11.5–14.5)
WBC: 9.2 10*3/uL (ref 3.6–11.0)

## 2015-05-14 LAB — BASIC METABOLIC PANEL
ANION GAP: 4 — AB (ref 5–15)
BUN: 10 mg/dL (ref 6–20)
CALCIUM: 8.5 mg/dL — AB (ref 8.9–10.3)
CO2: 27 mmol/L (ref 22–32)
Chloride: 108 mmol/L (ref 101–111)
Creatinine, Ser: 1.01 mg/dL — ABNORMAL HIGH (ref 0.44–1.00)
GFR calc Af Amer: >60 mL/min (ref >60)
GFR, EST NON AFRICAN AMERICAN: 57 mL/min — AB (ref >60)
GLUCOSE: 103 mg/dL — AB (ref 65–99)
Potassium: 4.5 mmol/L (ref 3.5–5.1)
Sodium: 139 mmol/L (ref 135–145)

## 2015-05-14 NOTE — Care Management Important Message (Signed)
Important Message  Patient Details  Name: Rachel Shaffer MRN: [id number] Date of Birth: 1951/02/04   Medicare Important Message Given:  Yes-third notification given    Albana Saperstein A, RN 05/14/2015, 10:31 AM

## 2015-05-14 NOTE — Consult Note (Signed)
  No BM since yesterday. No rectal bleeding. Still with dyspepsia. Hgb down slightly from yesterday. Pt requests EGD be repeated with possible dilation. Does not want to go through bowel prep now for repeat colonoscopy. Will plan EGD tomorrow with NPO after MN. thanks

## 2015-05-14 NOTE — Progress Notes (Signed)
Rachel Shaffer is a 64 y.o. female  BRBPR (bright red blood per rectum)   SUBJECTIVE:  Pt with anorexia, nausea, and "gas". Hurts "all over". No specific abdominal pain. No evidence of bleeding. Hgb lower this AM.  ______________________________________________________________________  ROS: Review of systems is unremarkable for any active cardiac,respiratory, GI, GU, hematologic, neurologic or psychiatric systems, 10 systems reviewed.  @CMEDLIST @  Past Medical History  Diagnosis Date  . Hypertension   . GERD (gastroesophageal reflux disease)   . Arthritis   . Fibromyalgia   . DJD (degenerative joint disease)   . Sjogren's disease ([place])   . Vitamin D deficiency   . Folic acid deficiency   . Blood transfusion without reported diagnosis   . Osteoporosis   . Stroke ([place])   . Aortic regurgitation   . Cataract   . Anxiety   . Complication of anesthesia     spinal headache  blood patch  . PONV (postoperative nausea and vomiting)   . Spinal headache   . Family history of adverse reaction to anesthesia     sister scoliosis and trouble with some anesthesia  . Coronary artery disease   . Asthma   . Bronchitis, chronic ([place])   . DDD (degenerative disc disease), cervical     all of back  . Depression   . Pain     non cardiac chest pain  . Anemia   . Hypothyroidism     Past Surgical History  Procedure Laterality Date  . Appendectomy    . Abdominal hysterectomy    . Cholecystectomy    . Breast biopsy Left 1992    negative  . Knee arthroscopy Left   . Tonsillectomy  1959  . Oophorectomy    . Brain surgery      meningioma  . Colonoscopy    . Esophagogastroduodenoscopy    . Stomach surgery      gastroplasty  . Total knee arthroplasty Right 04/26/2015    Procedure: TOTAL KNEE ARTHROPLASTY;  Surgeon: Leanor Kail, MD;  Location: [facility] ORS;  Service: Orthopedics;  Laterality: Right;    PHYSICAL EXAM:  BP 128/60 mmHg  Pulse 71  Temp(Src) 98.5 F (36.9 C) (Oral)   Resp 16  Ht 5\' 3"  (1.6 m)  Wt 109.317 kg (241 lb)  BMI 42.70 kg/m2  SpO2 96%  Wt Readings from Last 3 Encounters:  05/11/15 109.317 kg (241 lb)  04/26/15 108.41 kg (239 lb)  04/12/15 108.41 kg (239 lb)            Constitutional: NAD Neck: supple, no thyromegaly Respiratory: CTA, no rales or wheezes Cardiovascular: RRR, no murmur, no gallop Abdomen: soft, good BS, nontender Extremities: no edema Neuro: alert and oriented, no focal motor or sensory deficits  ASSESSMENT/PLAN:  Labs and imaging studies were reviewed  No change in care. Await f/u from GI regarding plans. Repeat labs in AM.

## 2015-05-14 NOTE — Care Management Note (Signed)
Case Management Note  Patient Details  Name: Rachel Shaffer MRN: [id number] Date of Birth: [birth year]  Subjective/Objective:   64yo Rachel Rachel Shaffer was admitted 05/11/15 with rectal/GI bleeding. She received a right TKA at [facility] on 04/26/15, and went to [facility] for Rehab. She was discharged from [facility] back to her home on 05/10/15. PCP=Dr Sparks. Pharmacy=[facility] [address] in [place]. Resides with her husband who transports her to appointments. Has a rolling walker at home and would like to have a BSC ordered for her. No home oxygen. Is an active patient of [facility] for PT and OT. [facility] did an initial assessment but has not yet started home health services due to Rachel Shaffer being hospitalized. Rachel Shaffer is currently scheduled for an EGD on Monday 05/15/15. Case Management will follow for discharge planning.                  Action/Plan:   Expected Discharge Date:                  Expected Discharge Plan:     In-House Referral:     Discharge planning Services     Post Acute Care Choice:    Choice offered to:     DME Arranged:    DME Agency:     HH Arranged:    [place] Agency:     Status of Service:     Medicare Important Message Given:  Yes-third notification given Date Medicare IM Given:    Medicare IM give by:    Date Additional Medicare IM Given:    Additional Medicare Important Message give by:     If discussed at [place] of Stay Meetings, dates discussed:    Additional Comments:  Srihaan Mastrangelo A, RN 05/14/2015, 1:54 PM

## 2015-05-14 NOTE — Progress Notes (Signed)
No GI bleed . Patient very anxious antianxiety meds given . Pain med for right knee. C/o itching  Benadryl used with good result patient up with minimal assist.

## 2015-05-14 NOTE — Progress Notes (Signed)
itching

## 2015-05-15 ENCOUNTER — Inpatient Hospital Stay: Payer: Medicare Other | Admitting: Anesthesiology

## 2015-05-15 ENCOUNTER — Encounter
Admission: EM | Disposition: A | Discharge status: Home / Self Care | Payer: Self-pay | Source: Home / Self Care | Attending: Internal Medicine

## 2015-05-15 ENCOUNTER — Encounter: Payer: Self-pay | Admitting: *Deleted

## 2015-05-15 HISTORY — PX: ESOPHAGOGASTRODUODENOSCOPY: SHX5428

## 2015-05-15 LAB — CBC WITH DIFFERENTIAL/PLATELET
BASOS PCT: 3 %
Basophils Absolute: 0.2 10*3/uL — ABNORMAL HIGH (ref 0–0.1)
Eosinophils Absolute: 0.5 10*3/uL (ref 0–0.7)
Eosinophils Relative: 7 %
HEMATOCRIT: 29 % — AB (ref 35.0–47.0)
HEMOGLOBIN: 9.7 g/dL — AB (ref 12.0–16.0)
LYMPHS ABS: 1.5 10*3/uL (ref 1.0–3.6)
LYMPHS PCT: 20 %
MCH: 30.8 pg (ref 26.0–34.0)
MCHC: 33.4 g/dL (ref 32.0–36.0)
MCV: 92.4 fL (ref 80.0–100.0)
MONOS PCT: 8 %
Monocytes Absolute: 0.6 10*3/uL (ref 0.2–0.9)
NEUTROS ABS: 4.7 10*3/uL (ref 1.4–6.5)
NEUTROS PCT: 62 %
Platelets: 422 10*3/uL (ref 150–440)
RBC: 3.14 MIL/uL — ABNORMAL LOW (ref 3.80–5.20)
RDW: 13.6 % (ref 11.5–14.5)
WBC: 7.6 10*3/uL (ref 3.6–11.0)

## 2015-05-15 SURGERY — EGD (ESOPHAGOGASTRODUODENOSCOPY)
Anesthesia: General

## 2015-05-15 MED ORDER — GLYCOPYRROLATE 0.2 MG/ML IJ SOLN
INTRAMUSCULAR | Status: DC | PRN
  Administered 2015-05-15: 0.2 mg via INTRAVENOUS

## 2015-05-15 MED ORDER — PROPOFOL 500 MG/50ML IV EMUL
INTRAVENOUS | Status: DC | PRN
Start: 2015-05-15 — End: 2015-05-15
  Administered 2015-05-15: 150 ug/kg/min via INTRAVENOUS

## 2015-05-15 MED ORDER — PROPOFOL 10 MG/ML IV BOLUS
INTRAVENOUS | Status: DC | PRN
  Administered 2015-05-15: 120 mg via INTRAVENOUS

## 2015-05-15 MED ORDER — SODIUM CHLORIDE 0.9 % IV SOLN
INTRAVENOUS | Status: DC
  Administered 2015-05-15: 1000 mL via INTRAVENOUS

## 2015-05-15 MED ORDER — LIDOCAINE HCL (CARDIAC) 20 MG/ML IV SOLN
INTRAVENOUS | Status: DC | PRN
  Administered 2015-05-15: 100 mg via INTRAVENOUS

## 2015-05-15 MED ORDER — CIPROFLOXACIN IN D5W 400 MG/200ML IV SOLN
400.0000 mg | Freq: Two times a day (BID) | INTRAVENOUS | Status: DC
  Administered 2015-05-15: 400 mg via INTRAVENOUS

## 2015-05-15 MED ORDER — BISACODYL 10 MG RE SUPP
10.0000 mg | Freq: Once | RECTAL | Status: AC
Start: 2015-05-15 — End: 2015-05-15
  Administered 2015-05-15: 10 mg via RECTAL
  Filled 2015-05-15: qty 1

## 2015-05-15 NOTE — Progress Notes (Signed)
Report called to Dava, RN.  No questions.  Ready to return to unit.  Jodie Echevaria, RN

## 2015-05-15 NOTE — Op Note (Signed)
[facility] Gastroenterology Patient Name: Rachel Shaffer Procedure Date: 05/15/2015 12:38 PM MRN: [id number] Account #: [id number] Date of Birth: 1951-01-03 Admit Type: Outpatient Age: 64 Room: [facility] ENDO ROOM 4 Gender: Female Note Status: Finalized Procedure:         Upper GI endoscopy Indications:       Epigastric abdominal pain, Dyspepsia, Dysphagia, NSAIDS use Providers:         Lupita Dawn. Candace Cruise, MD Referring MD:      Forest Gleason Md, MD (Referring MD) Medicines:         Monitored Anesthesia Care Complications:     No immediate complications. Procedure:         Pre-Anesthesia Assessment:                    - Prior to the procedure, a History and Physical was                     performed, and patient medications, allergies and                     sensitivities were reviewed. The patient's tolerance of                     previous anesthesia was reviewed.                    - The risks and benefits of the procedure and the sedation                     options and risks were discussed with the patient. All                     questions were answered and informed consent was obtained.                    - After reviewing the risks and benefits, the patient was                     deemed in satisfactory condition to undergo the procedure.                    After obtaining informed consent, the endoscope was passed                     under direct vision. Throughout the procedure, the                     patient's blood pressure, pulse, and oxygen saturations                     were monitored continuously. The Endoscope was introduced                     through the mouth, and advanced to the second part of                     duodenum. The upper GI endoscopy was accomplished without                     difficulty. The patient tolerated the procedure well. Findings:      No endoscopic abnormality was evident in the esophagus to explain the       patient's complaint of  dysphagia. It was decided, however, to proceed  with dilation of the entire esophagus. The scope was withdrawn. Dilation       was performed with a Maloney dilator with mild resistance at 17 Fr.      Localized mildly erythematous mucosa was found in the gastric antrum.       Biopsies were taken with a cold forceps for Helicobacter pylori testing.      The exam was otherwise without abnormality.      The examined duodenum was normal. Impression:        - No endoscopic esophageal abnormality to explain                     patient's dysphagia. Esophagus dilated. Dilated.                    - Erythematous mucosa in the antrum. Biopsied.                    - The examination was otherwise normal.                    - Normal examined duodenum. Recommendation:    - Observe patient's clinical course.                    - Continue present medications.                    - Await pathology results.                    - The findings and recommendations were discussed with the                     patient. Procedure Code(s): --- Professional ---                    [phone], Esophagogastroduodenoscopy, flexible, transoral;                     with biopsy, single or multiple                    43450, Dilation of esophagus, by unguided sound or bougie,                     single or multiple passes Diagnosis Code(s): --- Professional ---                    R13.10, Dysphagia, unspecified                    R10.13, Epigastric pain                    K31.9, Disease of stomach and duodenum, unspecified                    K30, Functional dyspepsia CPT copyright 2014 American Medical Association. All rights reserved. The codes documented in this report are preliminary and upon coder review may  be revised to meet current compliance requirements. Hulen Luster, MD 05/15/2015 12:48:50 PM This report has been signed electronically. Number of Addenda: 0 Note Initiated On: 05/15/2015 12:38 PM      [facility]

## 2015-05-15 NOTE — Anesthesia Postprocedure Evaluation (Signed)
  Anesthesia Post-op Note  Patient: Rachel Shaffer  Procedure(s) Performed: Procedure(s): ESOPHAGOGASTRODUODENOSCOPY (EGD) (N/A)  Anesthesia type:General  Patient location: PACU  Post pain: Pain level controlled  Post assessment: Post-op Vital signs reviewed, Patient's Cardiovascular Status Stable, Respiratory Function Stable, Patent Airway and No signs of Nausea or vomiting  Post vital signs: Reviewed and stable  Last Vitals:  Filed Vitals:   05/15/15 1330  BP: 122/75  Pulse: 77  Temp:   Resp: 17    Level of consciousness: awake, alert  and patient cooperative  Complications: No apparent anesthesia complications

## 2015-05-15 NOTE — Progress Notes (Signed)
S/p EGD WITH BX. NO BLEEDING NOTED. RECEIVED DULCOLAX SUPP. NO RESULTS YET. TOLERATED DIET

## 2015-05-15 NOTE — Progress Notes (Signed)
Rachel Shaffer is a 64 y.o. female  BRBPR (bright red blood per rectum)   SUBJECTIVE:  Pt in NAD. Still with diffuse pain. No bleeding. Hgb improved this AM.  ______________________________________________________________________  ROS: Review of systems is unremarkable for any active cardiac,respiratory, GI, GU, hematologic, neurologic or psychiatric systems, 10 systems reviewed.  @CMEDLIST @  Past Medical History  Diagnosis Date  . Hypertension   . GERD (gastroesophageal reflux disease)   . Arthritis   . Fibromyalgia   . DJD (degenerative joint disease)   . Sjogren's disease ([place])   . Vitamin D deficiency   . Folic acid deficiency   . Blood transfusion without reported diagnosis   . Osteoporosis   . Stroke ([place])   . Aortic regurgitation   . Cataract   . Anxiety   . Complication of anesthesia     spinal headache  blood patch  . PONV (postoperative nausea and vomiting)   . Spinal headache   . Family history of adverse reaction to anesthesia     sister scoliosis and trouble with some anesthesia  . Coronary artery disease   . Asthma   . Bronchitis, chronic ([place])   . DDD (degenerative disc disease), cervical     all of back  . Depression   . Pain     non cardiac chest pain  . Anemia   . Hypothyroidism     Past Surgical History  Procedure Laterality Date  . Appendectomy    . Abdominal hysterectomy    . Cholecystectomy    . Breast biopsy Left 1992    negative  . Knee arthroscopy Left   . Tonsillectomy  1959  . Oophorectomy    . Brain surgery      meningioma  . Colonoscopy    . Esophagogastroduodenoscopy    . Stomach surgery      gastroplasty  . Total knee arthroplasty Right 04/26/2015    Procedure: TOTAL KNEE ARTHROPLASTY;  Surgeon: Leanor Kail, MD;  Location: [facility] ORS;  Service: Orthopedics;  Laterality: Right;    PHYSICAL EXAM:  BP 126/51 mmHg  Pulse 69  Temp(Src) 98.5 F (36.9 C) (Oral)  Resp 19  Ht 5\' 3"  (1.6 m)  Wt 109.317 kg (241 lb)  BMI  42.70 kg/m2  SpO2 96%  Wt Readings from Last 3 Encounters:  05/11/15 109.317 kg (241 lb)  04/26/15 108.41 kg (239 lb)  04/12/15 108.41 kg (239 lb)            Constitutional: NAD Neck: supple, no thyromegaly Respiratory: CTA, no rales or wheezes Cardiovascular: RRR, no murmur, no gallop Abdomen: soft, good BS, nontender Extremities: no edema Neuro: alert and oriented, no focal motor or sensory deficits  ASSESSMENT/PLAN:  Labs and imaging studies were reviewed  No change in care. EGD today per GI. Repeat labs in AM.

## 2015-05-15 NOTE — Progress Notes (Signed)
   05/15/15 2706  Clinical Encounter Type  Visited With Patient and family together  Visit Type Initial  Consult/Referral To Chaplain  Spiritual Encounters  Spiritual Needs Prayer;Emotional  Stress Factors  Patient Stress Factors Health changes  Chaplain rounded in unit and offered pastoral care.   Chaplain Nastassja Witkop Ext: [phone]

## 2015-05-15 NOTE — Progress Notes (Signed)
Physical Therapy Treatment Patient Details Name: Rachel Shaffer MRN: [id number] DOB: [birth year] Today's Date: 05/15/2015    History of Present Illness Pt was admitted to [facility] due to BPBPR. She underwent R TKA at [facility] on 04/26/15. She returned home from SNF the day before admission to the hospital. [facility] PT performed evaluation but no follow-up appointments.    PT Comments    Pt demonstrates increased pain today due to subjective reports of decreased activity yesterday. She eventually agrees to ambulation after being initially resistant however refuses further ambulation than bed to bathroom. Pt able to complete all exercises as prescribed although reports increase in pain. Good LE strength noted. Pt encouraged to continue mobility with nursing and exercises in sitting and supine. Pt will benefit from skilled PT services to address deficits in strength, balance, and mobility in order to return to full function at home. E   Follow Up Recommendations  Home health PT (Resume HH PT at discharge)     Equipment Recommendations  None recommended by PT    Recommendations for Other Services       Precautions / Restrictions Precautions Precautions: Fall Precaution Booklet Issued: No Restrictions Weight Bearing Restrictions: No RLE Weight Bearing: Weight bearing as tolerated    Mobility  Bed Mobility Overal bed mobility: Modified Independent Bed Mobility: Supine to Sit;Sit to Supine     Supine to sit: Modified independent (Device/Increase time)     General bed mobility comments: Use of bed rails and HOB elevated. Good sequencing noted. Slight increase in time required to perform  Transfers Overall transfer level: Needs assistance Equipment used: Rolling walker (2 wheeled) Transfers: Sit to/from Stand Sit to Stand: Modified independent (Device/Increase time)         General transfer comment: Good strength noted during transfer. Pt reports some R knee pain during transfer. Good  weight acceptance to RLE  Ambulation/Gait Ambulation/Gait assistance: Min guard Ambulation Distance (Feet): 40 Feet Assistive device: Rolling walker (2 wheeled) Gait Pattern/deviations: Step-to pattern;Decreased step length - left;Decreased stance time - right;Antalgic Gait velocity: Decreased Gait velocity interpretation: <1.8 ft/sec, indicative of risk for recurrent falls General Gait Details: Pt demonstrates good sequencing with walker. Decreased step length noted today with pain reported with weight shifting to RLE. Pt with increased UE reliance for support on this date. Decreased fatigue. Pt ambulates with therapist to and from bathroom. Refuses further ambulation at this time   Financial trader Rankin (Stroke Patients Only)       Balance Overall balance assessment: Needs assistance   Sitting balance-Leahy Scale: Good       Standing balance-Leahy Scale: Fair                      Cognition Arousal/Alertness: Awake/alert Behavior During Therapy: WFL for tasks assessed/performed Overall Cognitive Status: Within Functional Limits for tasks assessed                      Exercises Total Joint Exercises Ankle Circles/Pumps: Strengthening;Both;Supine;20 reps Towel Squeeze: Strengthening;Both;15 reps;Supine Hip ABduction/ADduction: Strengthening;Both;Supine;20 reps (Performed x 20 in seated) Straight Leg Raises: Strengthening;Both;Supine;15 reps Long Arc Quad: Strengthening;Both;20 reps;Seated Knee Flexion: Both;Seated;Strengthening;20 reps Marching in Standing: Strengthening;Both;20 reps;Seated    General Comments        Pertinent Vitals/Pain Pain Assessment: No/denies pain Pain Location: Reports intermittent stomach pain from "gas" but currently no pain    Home Living  Prior Function            PT Goals (current goals can now be found in the care plan section) Acute Rehab PT  Goals Patient Stated Goal: "I want to get stronger and don't want to lose range of motion in my knee" PT Goal Formulation: With patient/family Time For Goal Achievement: 05/27/15 Potential to Achieve Goals: Good Progress towards PT goals: Progressing toward goals    Frequency  Min 2X/week    PT Plan Current plan remains appropriate    Co-evaluation             End of Session   Activity Tolerance: Patient limited by fatigue Patient left: with call bell/phone within reach;with family/visitor present;in chair (Pt agrees to call RN prior to transfers)     Time: 3382-5053 PT Time Calculation (min) (ACUTE ONLY): 29 min  Charges:  $Therapeutic Exercise: 23-37 mins                    G Codes:      Lyndel Safe Jene Huq PT, DPT   Reida Hem 05/15/2015, 9:47 AM

## 2015-05-15 NOTE — Anesthesia Preprocedure Evaluation (Signed)
Anesthesia Evaluation  Patient identified by MRN, date of birth, ID band Patient awake    Reviewed: Allergy & Precautions, H&P , NPO status , Patient's Chart, lab work & pertinent test results  History of Anesthesia Complications (+) PONV, POST - OP SPINAL HEADACHE, Family history of anesthesia reaction and history of anesthetic complications  Airway Mallampati: III  TM Distance: >3 FB Neck ROM: Limited    Dental no notable dental hx. (+) Chipped   Pulmonary shortness of breath and with exertion, asthma , sleep apnea , neg COPD, neg recent URI, former smoker,    Pulmonary exam normal        Cardiovascular Exercise Tolerance: Poor hypertension, Pt. on medications and Pt. on home beta blockers (-) angina+ CAD  (-) Past MI, (-) Cardiac Stents and (-) CABG Normal cardiovascular exam(-) dysrhythmias + Valvular Problems/Murmurs (aortic regurgitation)      Neuro/Psych  Headaches, PSYCHIATRIC DISORDERS (depression)  Neuromuscular disease (fibromyalgia) CVA    GI/Hepatic Neg liver ROS, GERD  Medicated and Controlled,  Endo/Other  neg diabetesHypothyroidism Morbid obesity  Renal/GU negative Renal ROS     Musculoskeletal  (+) Arthritis , Osteoarthritis,    Abdominal (+) + obese,  Abdomen: soft.    Peds  Hematology  (+) Blood dyscrasia, anemia ,   Anesthesia Other Findings   Reproductive/Obstetrics                             Anesthesia Physical  Anesthesia Plan  ASA: III  Anesthesia Plan: General   Post-op Pain Management:    Induction: Intravenous  Airway Management Planned: Oral ETT  Additional Equipment:   Intra-op Plan:   Post-operative Plan: Extubation in OR  Informed Consent:   Plan Discussed with: CRNA, Anesthesiologist and Surgeon  Anesthesia Plan Comments:         Anesthesia Quick Evaluation

## 2015-05-15 NOTE — Op Note (Signed)
EGD showed mild gastritis. bx taken for H. Pylori. Esophageal dilation done for dysphagia. Resume liquid diet. No BM since admission. Asked for suppository. Hopefully, can be discharged by tomorrow AM. Continue daily PPI, in light of chronic NSAIDS use. Pt can f/u with [place] in office in few weeks. Will sign off. Thanks.

## 2015-05-15 NOTE — Care Management (Signed)
Spoke with patient regarding bedside commode. She wanted a bariatric commode but her weight is less than 300 lbs. She agrees to regular bedside commode. I have requested commode from Will with [facility]. I have also notified Dr. Johny Shock of rx for commode and home health orders. Patient anticipates discharge tomorrow.

## 2015-05-15 NOTE — Transfer of Care (Signed)
Immediate Anesthesia Transfer of Care Note  Patient: Rachel Shaffer  Procedure(s) Performed: Procedure(s): ESOPHAGOGASTRODUODENOSCOPY (EGD) (N/A)  Patient Location: Endoscopy Unit  Anesthesia Type:General  Level of Consciousness: sedated  Airway & Oxygen Therapy: Patient Spontanous Breathing and Patient connected to nasal cannula oxygen  Post-op Assessment: Report given to RN and Post -op Vital signs reviewed and stable  Post vital signs: Reviewed and stable  Last Vitals:  Filed Vitals:   05/15/15 1205  BP: 149/64  Pulse: 69  Temp: 36.4 C  Resp: 16    Complications: No apparent anesthesia complications

## 2015-05-16 ENCOUNTER — Encounter: Payer: Self-pay | Admitting: Gastroenterology

## 2015-05-16 LAB — CBC WITH DIFFERENTIAL/PLATELET
Basophils Absolute: 0 10*3/uL (ref 0–0.1)
Basophils Relative: 1 %
EOS ABS: 0.5 10*3/uL (ref 0–0.7)
EOS PCT: 7 %
HCT: 30.2 % — ABNORMAL LOW (ref 35.0–47.0)
Hemoglobin: 9.9 g/dL — ABNORMAL LOW (ref 12.0–16.0)
LYMPHS ABS: 1.1 10*3/uL (ref 1.0–3.6)
Lymphocytes Relative: 16 %
MCH: 30.1 pg (ref 26.0–34.0)
MCHC: 32.8 g/dL (ref 32.0–36.0)
MCV: 91.6 fL (ref 80.0–100.0)
MONOS PCT: 10 %
Monocytes Absolute: 0.7 10*3/uL (ref 0.2–0.9)
Neutro Abs: 4.5 10*3/uL (ref 1.4–6.5)
Neutrophils Relative %: 66 %
PLATELETS: 406 10*3/uL (ref 150–440)
RBC: 3.3 MIL/uL — ABNORMAL LOW (ref 3.80–5.20)
RDW: 13.7 % (ref 11.5–14.5)
WBC: 6.8 10*3/uL (ref 3.6–11.0)

## 2015-05-16 MED ORDER — OXYCODONE HCL 5 MG PO TABS: 5.0000 mg | ORAL_TABLET | ORAL | Status: DC | PRN

## 2015-05-16 MED ORDER — ONDANSETRON HCL 4 MG PO TABS
4.0000 mg | ORAL_TABLET | Freq: Four times a day (QID) | ORAL | Status: DC | PRN
Start: 2015-05-16 — End: 2016-11-11

## 2015-05-16 MED ORDER — METAXALONE 400 MG PO TABS: 400.0000 mg | ORAL_TABLET | Freq: Three times a day (TID) | ORAL | Status: AC

## 2015-05-16 MED ORDER — FAMOTIDINE 20 MG PO TABS: 20.0000 mg | ORAL_TABLET | Freq: Two times a day (BID) | ORAL | Status: AC

## 2015-05-16 NOTE — Care Management Important Message (Signed)
Important Message  Patient Details  Name: Rachel Shaffer MRN: [id number] Date of Birth: 1951/05/07   Medicare Important Message Given:  Geralyn Flash notification given    Darius Bump Kase Shughart 05/16/2015, 9:39 AM

## 2015-05-16 NOTE — Care Management (Signed)
Bedside commode orders obtained along with home health orders. Patient eager to leave. I have notified Corene Cornea with [facility] of patient discharge today. No further RNCM needs. Case closed.

## 2015-05-16 NOTE — Progress Notes (Signed)
Discharge instructions and prescriptions given; acknowledged understanding. Discharge home.

## 2015-05-16 NOTE — Discharge Summary (Signed)
Rachel Shaffer, is a 64 y.o. female  DOB 04/01/1951  MRN [id number].  Admission date:  05/11/2015  Admitting Physician  Lance Coon, MD  Discharge Date:  05/16/2015   Primary MD  Choua Ikner D, MD  Recommendations for primary care physician for things to follow:      Admission Diagnosis  Acute GI bleeding [K92.2]   Discharge Diagnosis  Acute GI bleeding [K92.2]  Gastritis, hemorrhoids  Principal Problem:   BRBPR (bright red blood per rectum) Active Problems:   HTN (hypertension)   CAD (coronary artery disease)   Anxiety   GERD (gastroesophageal reflux disease)   HLD (hyperlipidemia)      Past Medical History  Diagnosis Date  . Hypertension   . GERD (gastroesophageal reflux disease)   . Arthritis   . Fibromyalgia   . DJD (degenerative joint disease)   . Sjogren's disease ([place])   . Vitamin D deficiency   . Folic acid deficiency   . Blood transfusion without reported diagnosis   . Osteoporosis   . Stroke ([place])   . Aortic regurgitation   . Cataract   . Anxiety   . Complication of anesthesia     spinal headache  blood patch  . PONV (postoperative nausea and vomiting)   . Spinal headache   . Family history of adverse reaction to anesthesia     sister scoliosis and trouble with some anesthesia  . Coronary artery disease   . Asthma   . Bronchitis, chronic ([place])   . DDD (degenerative disc disease), cervical     all of back  . Depression   . Pain     non cardiac chest pain  . Anemia   . Hypothyroidism     Past Surgical History  Procedure Laterality Date  . Appendectomy    . Abdominal hysterectomy    . Cholecystectomy    . Breast biopsy Left 1992    negative  . Knee arthroscopy Left   . Tonsillectomy  1959  . Oophorectomy    . Brain surgery      meningioma  . Colonoscopy    . Esophagogastroduodenoscopy    . Stomach surgery      gastroplasty  . Total knee  arthroplasty Right 04/26/2015    Procedure: TOTAL KNEE ARTHROPLASTY;  Surgeon: Leanor Kail, MD;  Location: [facility] ORS;  Service: Orthopedics;  Laterality: Right;  . Joint replacement Right        History of present illness and  Hospital Course:     Kindly see H&P for history of present illness and admission details, please review complete Labs, Consult reports and Test reports for all details in brief  HPI  from the history and physical done on the day of admission    Hospital Course    Pt admitted with BRBPR and upper abdominal pain. Hgb dropped slightly. Gi consult obtained. Pt refused colonoscopy. EGD revealed gastritis. Pt sx's improved with no further bleeding.   Discharge Condition: stable   Follow UP  Follow-up Information  Follow up with Kabrea Seeney D, MD In 3 days.   Specialty:  Internal Medicine   Contact information:   [place] [address] [phone]         Discharge Instructions  and  Discharge Medications   See below  Discharge Instructions    DME Bedside commode    Complete by:  As directed             Medication List    STOP taking these medications        enoxaparin 40 MG/0.4ML injection  Commonly known as:  LOVENOX     ibuprofen 800 MG tablet  Commonly known as:  ADVIL,MOTRIN     sulfamethoxazole-trimethoprim 800-160 MG tablet  Commonly known as:  BACTRIM DS,SEPTRA DS      TAKE these medications        ALIGN 4 MG Caps  Take 4 mg by mouth every morning.     amLODipine 5 MG tablet  Commonly known as:  NORVASC  Take 5 mg by mouth daily.     aspirin EC 81 MG tablet  Take 81 mg by mouth daily.     Biotin 5000 MCG Caps  Take 1 capsule by mouth daily.     butalbital-acetaminophen-caffeine 50-325-40 MG tablet  Commonly known as:  FIORICET, ESGIC  Take 2 tablets by mouth every 4 (four) hours as needed.     citalopram 40 MG tablet  Commonly known as:  CELEXA  Take 20 mg by  mouth 2 (two) times daily.     CoQ-10 400 MG Caps  Take 400 mg by mouth daily.     diphenoxylate-atropine 2.5-0.025 MG tablet  Commonly known as:  LOMOTIL  Take 1 tablet by mouth every 6 (six) hours as needed for diarrhea or loose stools.     Evening Primrose Oil 500 MG Caps  Take by mouth daily.     famotidine 20 MG tablet  Commonly known as:  PEPCID  Take 1 tablet (20 mg total) by mouth 2 (two) times daily.     ferrous sulfate 325 (65 FE) MG tablet  Take 325 mg by mouth 2 (two) times daily with a meal.     folic acid 1 MG tablet  Commonly known as:  FOLVITE  Take 1 mg by mouth every morning.     Ginkgo Biloba 40 MG Caps  Take by mouth daily.     hydrOXYzine 25 MG tablet  Commonly known as:  ATARAX/VISTARIL  Take 25 mg by mouth every 6 (six) hours as needed for anxiety or itching.     lansoprazole 30 MG capsule  Commonly known as:  PREVACID  Take 30 mg by mouth 2 (two) times daily.     lidocaine 5 %  Commonly known as:  LIDODERM  Place 1 patch onto the skin daily. Remove & Discard patch within 12 hours or as directed by MD     lisinopril 40 MG tablet  Commonly known as:  PRINIVIL,ZESTRIL  Take 20 mg by mouth 2 (two) times daily.     LORazepam 2 MG tablet  Commonly known as:  ATIVAN  Take 8 mg by mouth at bedtime. Takes 4 at bedtime     magnesium oxide 400 MG tablet  Commonly known as:  MAG-OX  Take 400 mg by mouth daily.     meperidine 50 MG tablet  Commonly known as:  DEMEROL  Take 1 tablet (50 mg total) by mouth every 4 (  four) hours as needed for moderate pain or severe pain.     metaxalone 400 MG tablet  Commonly known as:  SKELAXIN  Take 1 tablet (400 mg total) by mouth 3 (three) times daily.     ondansetron 4 MG tablet  Commonly known as:  ZOFRAN  Take 1 tablet (4 mg total) by mouth every 6 (six) hours as needed for nausea.     oxyCODONE 5 MG immediate release tablet  Commonly known as:  Oxy IR/ROXICODONE  Take 1 tablet (5 mg total) by mouth every  4 (four) hours as needed for severe pain.     polyethylene glycol packet  Commonly known as:  MIRALAX / GLYCOLAX  Take 17 g by mouth daily as needed.     promethazine 25 MG tablet  Commonly known as:  PHENERGAN  Take 25 mg by mouth every 6 (six) hours as needed.     propranolol ER 60 MG 24 hr capsule  Commonly known as:  INDERAL LA  Take 60 mg by mouth daily.     raloxifene 60 MG tablet  Commonly known as:  EVISTA  Take 60 mg by mouth daily.     simvastatin 40 MG tablet  Commonly known as:  ZOCOR  Take 40 mg by mouth every evening.     vitamin B-12 1000 MCG tablet  Commonly known as:  CYANOCOBALAMIN  Take 1,000 mcg by mouth daily.     VITAMIN B-2 PO  Take 1 tablet by mouth daily.          Diet and Activity recommendation: See Discharge Instructions above   Consults obtained - GI   Major procedures and Radiology Reports - PLEASE review detailed and final reports for all details, in brief -   See below   Dg Knee Right Port  04/26/2015  CLINICAL DATA:  Status post right knee replacement EXAM: PORTABLE RIGHT KNEE - 1-2 VIEW COMPARISON:  None. FINDINGS: The right knee replacement is now seen. Air is noted in the surgical bed. No acute bony abnormality is noted. IMPRESSION: Status post right knee replacement Electronically Signed   By: Inez Catalina M.D.   On: 04/26/2015 12:29    Micro Results   See below  No results found for this or any previous visit (from the past 240 hour(s)).     Today   Subjective:   Rachel Shaffer today has no headache,no chest abdominal pain,no new weakness tingling or numbness, feels much better wants to go home today.   Objective:   Blood pressure 122/62, pulse 65, temperature 98.4 F (36.9 C), temperature source Oral, resp. rate 18, height 5\' 3"  (1.6 m), weight 106.595 kg (235 lb), SpO2 95 %.   Intake/Output Summary (Last 24 hours) at 05/16/15 0734 Last data filed at 05/15/15 2200  Gross per 24 hour  Intake      3 ml  Output       0 ml  Net      3 ml    Exam Awake Alert, Oriented x 3, No new F.N deficits, Normal affect [place].AT,PERRAL Supple Neck,No JVD, No cervical lymphadenopathy appriciated.  Symmetrical Chest wall movement, Good air movement bilaterally, CTAB RRR,No Gallops,Rubs or new Murmurs, No Parasternal Heave +ve B.Sounds, Abd Soft, Non tender, No organomegaly appriciated, No rebound -guarding or rigidity. No Cyanosis, Clubbing or edema, No new Rash or bruise  Data Review   CBC w Diff: Lab Results  Component Value Date   WBC 7.6 05/15/2015   WBC 7.6  07/28/2014   HGB 9.7* 05/15/2015   HGB 13.0 07/28/2014   HCT 29.0* 05/15/2015   HCT 40.3 07/28/2014   PLT 422 05/15/2015   PLT 232 07/28/2014   LYMPHOPCT 20 05/15/2015   LYMPHOPCT 18.3 07/20/2014   MONOPCT 8 05/15/2015   MONOPCT 8.1 07/20/2014   EOSPCT 7 05/15/2015   EOSPCT 6.6 07/20/2014   BASOPCT 3 05/15/2015   BASOPCT 1.0 07/20/2014    CMP: Lab Results  Component Value Date   NA 139 05/14/2015   NA 142 07/28/2014   K 4.5 05/14/2015   K 3.4* 07/28/2014   CL 108 05/14/2015   CL 108* 07/28/2014   CO2 27 05/14/2015   CO2 26 07/28/2014   BUN 10 05/14/2015   BUN 13 07/28/2014   CREATININE 1.01* 05/14/2015   CREATININE 1.04 07/28/2014   PROT 6.5 05/11/2015   PROT 7.3 07/28/2014   ALBUMIN 3.0* 05/11/2015   ALBUMIN 3.5 07/28/2014   BILITOT 0.4 05/11/2015   BILITOT 0.3 07/28/2014   ALKPHOS 109 05/11/2015   ALKPHOS 102 07/28/2014   AST 20 05/11/2015   AST 24 07/28/2014   ALT 16 05/11/2015   ALT 22 07/28/2014  .   Total Time in preparing paper work, data evaluation and todays exam - 72 minutes  Artice Holohan D M.D on 05/16/2015 at 7:34 AM

## 2015-05-17 LAB — SURGICAL PATHOLOGY

## 2015-05-19 DIAGNOSIS — Z96651 Presence of right artificial knee joint: Secondary | ICD-10-CM | POA: Diagnosis not present

## 2015-05-19 DIAGNOSIS — Z471 Aftercare following joint replacement surgery: Secondary | ICD-10-CM | POA: Diagnosis not present

## 2015-07-18 ENCOUNTER — Emergency Department: Payer: Medicare Other

## 2015-07-18 ENCOUNTER — Emergency Department
Admission: EM | Admit: 2015-07-18 | Discharge: 2015-07-18 | Disposition: A | Discharge status: Home / Self Care | Payer: Medicare Other | Attending: Emergency Medicine | Admitting: Emergency Medicine

## 2015-07-18 ENCOUNTER — Encounter: Payer: Self-pay | Admitting: Emergency Medicine

## 2015-07-18 DIAGNOSIS — S4992XA Unspecified injury of left shoulder and upper arm, initial encounter: Secondary | ICD-10-CM | POA: Diagnosis present

## 2015-07-18 DIAGNOSIS — S8011XA Contusion of right lower leg, initial encounter: Secondary | ICD-10-CM | POA: Diagnosis not present

## 2015-07-18 DIAGNOSIS — S8012XA Contusion of left lower leg, initial encounter: Secondary | ICD-10-CM | POA: Insufficient documentation

## 2015-07-18 DIAGNOSIS — Y998 Other external cause status: Secondary | ICD-10-CM | POA: Diagnosis not present

## 2015-07-18 DIAGNOSIS — Z79899 Other long term (current) drug therapy: Secondary | ICD-10-CM | POA: Insufficient documentation

## 2015-07-18 DIAGNOSIS — I10 Essential (primary) hypertension: Secondary | ICD-10-CM | POA: Diagnosis not present

## 2015-07-18 DIAGNOSIS — Y9289 Other specified places as the place of occurrence of the external cause: Secondary | ICD-10-CM | POA: Insufficient documentation

## 2015-07-18 DIAGNOSIS — Y93A1 Activity, exercise machines primarily for cardiorespiratory conditioning: Secondary | ICD-10-CM | POA: Diagnosis not present

## 2015-07-18 DIAGNOSIS — S42292A Other displaced fracture of upper end of left humerus, initial encounter for closed fracture: Secondary | ICD-10-CM | POA: Insufficient documentation

## 2015-07-18 DIAGNOSIS — T07XXXA Unspecified multiple injuries, initial encounter: Secondary | ICD-10-CM

## 2015-07-18 DIAGNOSIS — Z87891 Personal history of nicotine dependence: Secondary | ICD-10-CM | POA: Insufficient documentation

## 2015-07-18 DIAGNOSIS — W1839XA Other fall on same level, initial encounter: Secondary | ICD-10-CM | POA: Insufficient documentation

## 2015-07-18 DIAGNOSIS — S42202A Unspecified fracture of upper end of left humerus, initial encounter for closed fracture: Secondary | ICD-10-CM

## 2015-07-18 DIAGNOSIS — Z91040 Latex allergy status: Secondary | ICD-10-CM | POA: Diagnosis not present

## 2015-07-18 DIAGNOSIS — Z7982 Long term (current) use of aspirin: Secondary | ICD-10-CM | POA: Diagnosis not present

## 2015-07-18 MED ORDER — FENTANYL 25 MCG/HR TD PT72: 25.0000 ug | MEDICATED_PATCH | TRANSDERMAL | Status: AC

## 2015-07-18 MED ORDER — FENTANYL 25 MCG/HR TD PT72
25.0000 ug | MEDICATED_PATCH | TRANSDERMAL | Status: DC
  Administered 2015-07-18: 25 ug via TRANSDERMAL

## 2015-07-18 MED ORDER — FENTANYL CITRATE (PF) 100 MCG/2ML IJ SOLN
50.0000 ug | INTRAMUSCULAR | Status: AC
  Administered 2015-07-18 (×2): 50 ug via INTRAVENOUS
  Filled 2015-07-18 (×2): qty 2

## 2015-07-18 NOTE — ED Notes (Signed)

## 2015-07-18 NOTE — Discharge Instructions (Signed)
Use the shoulder immobilizer to steady the left arm. He was the fentanyl patch for steady pain control.  If you have breakthrough pain, he may take oxycodone as you have in the past. Follow-up with Dr. Herschel [place] as planned. Return the emergency department if you have uncontrolled pain, shortness of breath, or other urgent concerns.  Humerus Fracture Treated With Immobilization The humerus is the large bone in your upper arm. You have a broken (fractured) humerus. These fractures are easily diagnosed with X-rays. TREATMENT  Simple fractures which will heal without disability are treated with simple immobilization. Immobilization means you will wear a cast, splint, or sling. You have a fracture which will do well with immobilization. The fracture will heal well simply by being held in a good position until it is stable enough to begin range of motion exercises. Do not take part in activities which would further injure your arm.  HOME CARE INSTRUCTIONS   Put ice on the injured area.  Put ice in a plastic bag.  Place a towel between your skin and the bag.  Leave the ice on for 15-20 minutes, 03-04 times a day.  If you have a cast:  Do not scratch the skin under the cast using sharp or pointed objects.  Check the skin around the cast every day. You may put lotion on any red or sore areas.  Keep your cast dry and clean.  If you have a splint:  Wear the splint as directed.  Keep your splint dry and clean.  You may loosen the elastic around the splint if your fingers become numb, tingle, or turn cold or blue.  If you have a sling:  Wear the sling as directed.  Do not put pressure on any part of your cast or splint until it is fully hardened.  Your cast or splint can be protected during bathing with a plastic bag. Do not lower the cast or splint into water.  Only take over-the-counter or prescription medicines for pain, discomfort, or fever as directed by your caregiver.  Do range of  motion exercises as instructed by your caregiver.  Follow up as directed by your caregiver. This is very important in order to avoid permanent injury or disability and chronic pain. SEEK IMMEDIATE MEDICAL CARE IF:   Your skin or nails in the injured arm turn blue or gray.  Your arm feels cold or numb.  You develop severe pain in the injured arm.  You are having problems with the medicines you were given. MAKE SURE YOU:   Understand these instructions.  Will watch your condition.  Will get help right away if you are not doing well or get worse.   This information is not intended to replace advice given to you by your health care provider. Make sure you discuss any questions you have with your health care provider.   Document Released: 10/07/2000 Document Revised: 07/22/2014 Document Reviewed: 11/23/2014 Elsevier Interactive Patient Education [employer].

## 2015-07-18 NOTE — ED Notes (Signed)
Pt s/p fall from treadmill when treadmill unexpectedly sped up. Fell face first, c/o left shoulder pain, left elbow pain, bilateral shin bruising. Ems gave 20mcg fentanyl x 4 (237mcg total) and 4mg  zofran

## 2015-07-18 NOTE — ED Provider Notes (Signed)
[facility] Emergency Department Provider Note  ____________________________________________  Time seen: 1620 I have reviewed the triage vital signs and the nursing notes.  History by:    firm nodular area just superior to the umbilicus.  HISTORY  Chief Complaint Fall     HPI Rachel Shaffer is a 65 y.o. female who had knee replacement surgery on the right and October. She has been trying to remain active and walked a half mile a day. Today, with the rain, she chose to walk on a treadmill. She reports she had the treadmill set at 0.7 miles per hour and was walking slowly when all of a sudden it went up to 3 miles per hour. She said she couldn't keep up. She did not think to turn the machine off or to step off to the side. Instead, she fell forward. She is complaining of pain in her left shoulder and she reports pain in both of her lower legs. She denies any loss of consciousness and does not report significant head or neck pain. She is communicative and acting appropriate.    Past Medical History  Diagnosis Date  . Hypertension   . GERD (gastroesophageal reflux disease)   . Arthritis   . Fibromyalgia   . DJD (degenerative joint disease)   . Sjogren's disease ([place])   . Vitamin D deficiency   . Folic acid deficiency   . Blood transfusion without reported diagnosis   . Osteoporosis   . Stroke ([place])   . Aortic regurgitation   . Cataract   . Anxiety   . Complication of anesthesia     spinal headache  blood patch  . PONV (postoperative nausea and vomiting)   . Spinal headache   . Family history of adverse reaction to anesthesia     sister scoliosis and trouble with some anesthesia  . Coronary artery disease   . Asthma   . Bronchitis, chronic ([place])   . DDD (degenerative disc disease), cervical     all of back  . Depression   . Pain     non cardiac chest pain  . Anemia   . Hypothyroidism     Patient Active Problem List   Diagnosis Date Noted  .  HTN (hypertension) 05/11/2015  . CAD (coronary artery disease) 05/11/2015  . Anxiety 05/11/2015  . GERD (gastroesophageal reflux disease) 05/11/2015  . BRBPR (bright red blood per rectum) 05/11/2015  . HLD (hyperlipidemia) 05/11/2015  . S/P total knee replacement 04/26/2015  . Lumbar facet arthropathy 01/17/2015  . Thoracic facet joint syndrome 01/17/2015  . DDD (degenerative disc disease), thoracic 01/17/2015  . DDD (degenerative disc disease), lumbar 01/17/2015  . Bilateral occipital neuralgia 01/17/2015  . Migraine 01/17/2015    Past Surgical History  Procedure Laterality Date  . Appendectomy    . Abdominal hysterectomy    . Cholecystectomy    . Breast biopsy Left 1992    negative  . Knee arthroscopy Left   . Tonsillectomy  1959  . Oophorectomy    . Brain surgery      meningioma  . Colonoscopy    . Esophagogastroduodenoscopy    . Stomach surgery      gastroplasty  . Total knee arthroplasty Right 04/26/2015    Procedure: TOTAL KNEE ARTHROPLASTY;  Surgeon: Leanor Kail, MD;  Location: [facility] ORS;  Service: Orthopedics;  Laterality: Right;  . Joint replacement Right   . Esophagogastroduodenoscopy N/A 05/15/2015    Procedure: ESOPHAGOGASTRODUODENOSCOPY (EGD);  Surgeon: Hulen Luster,  MD;  Location: [facility] ENDOSCOPY;  Service: Endoscopy;  Laterality: N/A;    Current Outpatient Rx  Name  Route  Sig  Dispense  Refill  . acidophilus (RISAQUAD) CAPS capsule   Oral   Take 1 capsule by mouth daily.         Marland Kitchen albuterol (PROVENTIL HFA;VENTOLIN HFA) 108 (90 Base) MCG/ACT inhaler   Inhalation   Inhale 2 puffs into the lungs every 6 (six) hours as needed for wheezing or shortness of breath.         Marland Kitchen amLODipine (NORVASC) 5 MG tablet   Oral   Take 5 mg by mouth daily.         Marland Kitchen aspirin EC 81 MG tablet   Oral   Take 81 mg by mouth at bedtime.          . benzonatate (TESSALON) 100 MG capsule   Oral   Take 200 mg by mouth 3 (three) times daily as needed for cough.          . Biotin 5000 MCG CAPS   Oral   Take 5,000 mcg by mouth daily.          . butalbital-acetaminophen-caffeine (FIORICET, ESGIC) 50-325-40 MG tablet   Oral   Take 1 tablet by mouth every 4 (four) hours as needed for migraine.       0   . citalopram (CELEXA) 20 MG tablet   Oral   Take 20 mg by mouth 2 (two) times daily.         . diphenoxylate-atropine (LOMOTIL) 2.5-0.025 MG tablet   Oral   Take 1-2 tablets by mouth every 6 (six) hours as needed for diarrhea or loose stools.          . famotidine (PEPCID) 20 MG tablet   Oral   Take 1 tablet (20 mg total) by mouth 2 (two) times daily.   60 tablet   5   . folic acid (FOLVITE) 1 MG tablet   Oral   Take 1 mg by mouth daily.         . furosemide (LASIX) 20 MG tablet   Oral   Take 20 mg by mouth daily as needed for edema.         . hydrOXYzine (ATARAX/VISTARIL) 25 MG tablet   Oral   Take 25 mg by mouth 4 (four) times daily as needed for itching.          Marland Kitchen ibuprofen (ADVIL,MOTRIN) 800 MG tablet   Oral   Take 800 mg by mouth 3 (three) times daily as needed for mild pain.         Marland Kitchen lansoprazole (PREVACID) 30 MG capsule   Oral   Take 30 mg by mouth 2 (two) times daily.         Marland Kitchen lidocaine (LIDODERM) 5 %   Transdermal   Place 1 patch onto the skin daily as needed (for pain). Remove & Discard patch within 12 hours or as directed by MD         . lisinopril (PRINIVIL,ZESTRIL) 40 MG tablet   Oral   Take 40 mg by mouth daily.          Marland Kitchen LORazepam (ATIVAN) 2 MG tablet   Oral   Take 4 mg by mouth at bedtime.          . magnesium oxide (MAG-OX) 400 MG tablet   Oral   Take 400 mg by mouth at bedtime.          Marland Kitchen  metaxalone (SKELAXIN) 400 MG tablet   Oral   Take 1 tablet (400 mg total) by mouth 3 (three) times daily.   90 tablet   1   . ondansetron (ZOFRAN) 4 MG tablet   Oral   Take 1 tablet (4 mg total) by mouth every 6 (six) hours as needed for nausea.   20 tablet   0   . oxyCODONE (OXY  IR/ROXICODONE) 5 MG immediate release tablet   Oral   Take 1 tablet (5 mg total) by mouth every 4 (four) hours as needed for severe pain.   30 tablet   0   . promethazine (PHENERGAN) 25 MG tablet   Oral   Take 25 mg by mouth every 6 (six) hours as needed for nausea or vomiting.          . propranolol ER (INDERAL LA) 60 MG 24 hr capsule   Oral   Take 60 mg by mouth at bedtime.          . raloxifene (EVISTA) 60 MG tablet   Oral   Take 60 mg by mouth daily.         . riboflavin (VITAMIN B-2) 100 MG TABS tablet   Oral   Take 400 mg by mouth daily.         . simvastatin (ZOCOR) 40 MG tablet   Oral   Take 40 mg by mouth at bedtime.          . Vitamin D, Ergocalciferol, (DRISDOL) 50000 units CAPS capsule   Oral   Take 50,000 Units by mouth every 7 (seven) days. Pt takes on Saturday.         . fentaNYL (DURAGESIC) 25 MCG/HR patch   Transdermal   Place 1 patch (25 mcg total) onto the skin every 3 (three) days.   1 patch   0   . meperidine (DEMEROL) 50 MG tablet   Oral   Take 1 tablet (50 mg total) by mouth every 4 (four) hours as needed for moderate pain or severe pain. Patient not taking: Reported on 05/11/2015   30 tablet   0     Allergies Ceftin; Nucynta; Other; Cadmium; Cephalexin; Codeine; Contrast media; Depakote; Dilaudid; Feldene; Fosamax; Imitrex; Latex; Methotrexate derivatives; Morphine and related; Nexium; Nsaids; Percocet; Protonix; Relafen; Tegretol; Topamax; Trazodone and nefazodone; Vicodin; and Wellbutrin  Family History  Problem Relation Age of Onset  . Breast cancer Maternal Aunt 60  . Arthritis Mother   . Vision loss Mother   . Heart disease Mother   . Hypertension Mother   . Hyperlipidemia Mother   . Kidney disease Mother   . Stroke Father   . Asthma Father   . COPD Father   . Heart disease Father   . Hypertension Father   . Hyperlipidemia Father   . Kidney disease Father     Social History Social History  Substance Use  Topics  . Smoking status: Former Smoker    Types: Cigarettes    Quit date: 04/12/1975  . Smokeless tobacco: None  . Alcohol Use: No    Review of Systems  Constitutional: Negative for fever/chills. ENT: Negative for congestion. Cardiovascular: Negative for chest pain. Respiratory: Negative for cough. Gastrointestinal: Negative for abdominal pain, vomiting and diarrhea. Genitourinary: Negative for dysuria. Musculoskeletal: Contusions to bilateral lower extremities. Pain and left shoulder. See history of present illness Skin: Negative for rash. Neurological: Negative for headache or focal weakness   10-point ROS otherwise negative.  ____________________________________________   PHYSICAL  EXAM:  VITAL SIGNS: ED Triage Vitals  Enc Vitals Group     BP --      Pulse --      Resp --      Temp --      Temp src --      SpO2 --      Weight --      Height --      Head Cir --      Peak Flow --      Pain Score --      Pain Loc --      Pain Edu? --      Excl. in [place]? --     Constitutional:  Alert and oriented. Patient has a sling and swath on her left arm. She appears uncomfortable, but alert and communicative. ENT   Head: Normocephalic and atraumatic.   Nose: No congestion/rhinnorhea.       Mouth: No erythema, no swelling   Cervical: Nontender. Normal spontaneous range of motion. Cardiovascular: Normal rate, regular rhythm, no murmur noted Respiratory:  Normal respiratory effort, no tachypnea.    Breath sounds are clear and equal bilaterally.  Gastrointestinal: Soft, no distention. Nontender Back: No muscle spasm, no tenderness, no CVA tenderness. Musculoskeletal: Sling and swath on left arm present. This was removed for examination. No noted deformity. She does have tenderness in the proximal humerus. No deformity to the shoulder consistent with a dislocation or separation. No tenderness in the left or right clavicle. The elbow is nontender and there is no apparent  focal swelling in that area. Patient does have ecchymotic areas on the anterior portion of both lower legs. She has good range of motion in both knees and ankles. She has good dorsal and plantar flexion in her feet. Neurologic:  Communicative. Normal appearing spontaneous movement in all 4 extremities. No gross focal neurologic deficits are appreciated.  Skin:  Skin is warm, dry. No rash noted. Ecchymosis in the anterior portion of both lower legs as mentioned above. Psychiatric: Mood and affect are normal. Speech and behavior are normal.  ____________________________________________    LABS (pertinent positives/negatives)     ____________________________________________   EKG    ____________________________________________    RADIOLOGY  Left humerus: IMPRESSION: Displaced minimal comminuted subcapital fracture proximal shaft of the left humerus.   Chest X-ray: IMPRESSION: 1. Fracture of the proximal LEFT humerus. 2. No acute cardiopulmonary findings. ____________________________________________   PROCEDURES    ____________________________________________   INITIAL IMPRESSION / ASSESSMENT AND PLAN / ED COURSE  Pertinent labs & imaging results that were available during my care of the patient were reviewed by me and considered in my medical decision making (see chart for details).  Alert, communicative, 65 year old female in mild to moderate discomfort due to pain in her left shoulder/proximal humerus. Am concerned about a proximal humerus fracture. We will obtain an x-ray of the left humerus as well as a chest x-ray for a broader view. I have offered x-rays for the bruised area on both lower legs, however the patient declines this. I think this declination is very reasonable. She has good range of motion and no point tenderness in these her ankles or indication of acute fracture in this area.  ----------------------------------------- 6:09 PM on  07/18/2015 -----------------------------------------  Patient does have a proximal humerus fracture on the left. I've discussed this with her. She is challenged regarding pain control medications because she has itching and other allergic reactions to many of them. She reports she tolerates fentanyl  well. I believe she is a good candidate to use a fentanyl patch over the next 3-6 days. She can then use oxycodone as she has in the past for breakthrough pain. She is to take hydroxyzine or Benadryl when she takes the oxycodone.  We will begin the patch or in the emergency Department. We'll fit her with a shoulder immobilizer. Orthopedic doctor is Dr. Quentin Angst. She is in a point with him this week. I'll ask her to follow-up with him on Thursday regarding this proximal humerus fracture. I will write her a prescription for one replacement patch. This will give her a total of 6 days of coverage with the fentanyl.  ____________________________________________   FINAL CLINICAL IMPRESSION(S) / ED DIAGNOSES  Final diagnoses:  Proximal humerus fracture, left, closed, initial encounter  Multiple contusions      Ahmed Prima, MD 07/18/15 1844

## 2015-08-15 ENCOUNTER — Ambulatory Visit: Payer: Medicare Other

## 2015-08-15 ENCOUNTER — Observation Stay
Admission: RE | Admit: 2015-08-15 | Discharge: 2015-08-16 | Disposition: A | Discharge status: Home / Self Care | Payer: Medicare Other | Source: Ambulatory Visit | Attending: Surgery | Admitting: Surgery

## 2015-08-15 ENCOUNTER — Encounter
Admission: RE | Disposition: A | Discharge status: Home / Self Care | Payer: Self-pay | Source: Ambulatory Visit | Attending: Surgery

## 2015-08-15 ENCOUNTER — Encounter: Payer: Self-pay | Admitting: *Deleted

## 2015-08-15 ENCOUNTER — Ambulatory Visit: Payer: Medicare Other | Admitting: Certified Registered"

## 2015-08-15 DIAGNOSIS — Z91040 Latex allergy status: Secondary | ICD-10-CM | POA: Insufficient documentation

## 2015-08-15 DIAGNOSIS — Z8249 Family history of ischemic heart disease and other diseases of the circulatory system: Secondary | ICD-10-CM | POA: Diagnosis not present

## 2015-08-15 DIAGNOSIS — K219 Gastro-esophageal reflux disease without esophagitis: Secondary | ICD-10-CM | POA: Diagnosis not present

## 2015-08-15 DIAGNOSIS — M793 Panniculitis, unspecified: Secondary | ICD-10-CM | POA: Diagnosis not present

## 2015-08-15 DIAGNOSIS — Z91010 Allergy to peanuts: Secondary | ICD-10-CM | POA: Diagnosis not present

## 2015-08-15 DIAGNOSIS — Z9049 Acquired absence of other specified parts of digestive tract: Secondary | ICD-10-CM | POA: Diagnosis not present

## 2015-08-15 DIAGNOSIS — I10 Essential (primary) hypertension: Secondary | ICD-10-CM | POA: Insufficient documentation

## 2015-08-15 DIAGNOSIS — M3500 Sicca syndrome, unspecified: Secondary | ICD-10-CM | POA: Insufficient documentation

## 2015-08-15 DIAGNOSIS — F329 Major depressive disorder, single episode, unspecified: Secondary | ICD-10-CM | POA: Insufficient documentation

## 2015-08-15 DIAGNOSIS — Z91041 Radiographic dye allergy status: Secondary | ICD-10-CM | POA: Insufficient documentation

## 2015-08-15 DIAGNOSIS — Z86011 Personal history of benign neoplasm of the brain: Secondary | ICD-10-CM | POA: Insufficient documentation

## 2015-08-15 DIAGNOSIS — Z6841 Body Mass Index (BMI) 40.0 and over, adult: Secondary | ICD-10-CM | POA: Diagnosis not present

## 2015-08-15 DIAGNOSIS — Z96651 Presence of right artificial knee joint: Secondary | ICD-10-CM | POA: Insufficient documentation

## 2015-08-15 DIAGNOSIS — Z79891 Long term (current) use of opiate analgesic: Secondary | ICD-10-CM | POA: Diagnosis not present

## 2015-08-15 DIAGNOSIS — E669 Obesity, unspecified: Secondary | ICD-10-CM | POA: Diagnosis not present

## 2015-08-15 DIAGNOSIS — Z885 Allergy status to narcotic agent status: Secondary | ICD-10-CM | POA: Insufficient documentation

## 2015-08-15 DIAGNOSIS — E785 Hyperlipidemia, unspecified: Secondary | ICD-10-CM | POA: Diagnosis not present

## 2015-08-15 DIAGNOSIS — Z419 Encounter for procedure for purposes other than remedying health state, unspecified: Secondary | ICD-10-CM

## 2015-08-15 DIAGNOSIS — I351 Nonrheumatic aortic (valve) insufficiency: Secondary | ICD-10-CM | POA: Diagnosis not present

## 2015-08-15 DIAGNOSIS — S42209A Unspecified fracture of upper end of unspecified humerus, initial encounter for closed fracture: Secondary | ICD-10-CM | POA: Diagnosis present

## 2015-08-15 DIAGNOSIS — Z90710 Acquired absence of both cervix and uterus: Secondary | ICD-10-CM | POA: Insufficient documentation

## 2015-08-15 DIAGNOSIS — Z79899 Other long term (current) drug therapy: Secondary | ICD-10-CM | POA: Diagnosis not present

## 2015-08-15 DIAGNOSIS — F419 Anxiety disorder, unspecified: Secondary | ICD-10-CM | POA: Diagnosis not present

## 2015-08-15 DIAGNOSIS — Z888 Allergy status to other drugs, medicaments and biological substances status: Secondary | ICD-10-CM | POA: Diagnosis not present

## 2015-08-15 DIAGNOSIS — Z91018 Allergy to other foods: Secondary | ICD-10-CM | POA: Diagnosis not present

## 2015-08-15 DIAGNOSIS — X58XXXA Exposure to other specified factors, initial encounter: Secondary | ICD-10-CM | POA: Insufficient documentation

## 2015-08-15 DIAGNOSIS — S42222A 2-part displaced fracture of surgical neck of left humerus, initial encounter for closed fracture: Principal | ICD-10-CM | POA: Insufficient documentation

## 2015-08-15 DIAGNOSIS — Z820 Family history of epilepsy and other diseases of the nervous system: Secondary | ICD-10-CM | POA: Diagnosis not present

## 2015-08-15 DIAGNOSIS — Z823 Family history of stroke: Secondary | ICD-10-CM | POA: Diagnosis not present

## 2015-08-15 DIAGNOSIS — Z9889 Other specified postprocedural states: Secondary | ICD-10-CM | POA: Diagnosis not present

## 2015-08-15 DIAGNOSIS — Z7982 Long term (current) use of aspirin: Secondary | ICD-10-CM | POA: Diagnosis not present

## 2015-08-15 DIAGNOSIS — M797 Fibromyalgia: Secondary | ICD-10-CM | POA: Diagnosis not present

## 2015-08-15 DIAGNOSIS — Z791 Long term (current) use of non-steroidal anti-inflammatories (NSAID): Secondary | ICD-10-CM | POA: Diagnosis not present

## 2015-08-15 DIAGNOSIS — I776 Arteritis, unspecified: Secondary | ICD-10-CM | POA: Insufficient documentation

## 2015-08-15 DIAGNOSIS — M7989 Other specified soft tissue disorders: Secondary | ICD-10-CM

## 2015-08-15 DIAGNOSIS — Z7951 Long term (current) use of inhaled steroids: Secondary | ICD-10-CM | POA: Diagnosis not present

## 2015-08-15 DIAGNOSIS — Z87440 Personal history of urinary (tract) infections: Secondary | ICD-10-CM | POA: Insufficient documentation

## 2015-08-15 HISTORY — PX: ORIF HUMERUS FRACTURE: SHX2126

## 2015-08-15 SURGERY — OPEN REDUCTION INTERNAL FIXATION (ORIF) PROXIMAL HUMERUS FRACTURE
Anesthesia: General | Site: Arm Upper | Laterality: Left | Wound class: Clean

## 2015-08-15 MED ORDER — MIDAZOLAM HCL 2 MG/2ML IJ SOLN
INTRAMUSCULAR | Status: DC | PRN
  Administered 2015-08-15: 2 mg via INTRAVENOUS

## 2015-08-15 MED ORDER — ACETAMINOPHEN 500 MG PO TABS
1000.0000 mg | ORAL_TABLET | Freq: Four times a day (QID) | ORAL | Status: DC
  Administered 2015-08-15 – 2015-08-16 (×3): 1000 mg via ORAL
  Filled 2015-08-15 (×3): qty 2

## 2015-08-15 MED ORDER — OXYCODONE HCL 5 MG PO TABS
5.0000 mg | ORAL_TABLET | ORAL | Status: DC | PRN
  Administered 2015-08-15 – 2015-08-16 (×3): 5 mg via ORAL
  Filled 2015-08-15 (×2): qty 1
  Filled 2015-08-15: qty 2

## 2015-08-15 MED ORDER — MORPHINE SULFATE (PF) 2 MG/ML IV SOLN: 2.0000 mg | INTRAVENOUS | Status: DC | PRN

## 2015-08-15 MED ORDER — PROPOFOL 10 MG/ML IV BOLUS
INTRAVENOUS | Status: DC | PRN
  Administered 2015-08-15 (×2): 20 mg via INTRAVENOUS
  Administered 2015-08-15: 160 mg via INTRAVENOUS

## 2015-08-15 MED ORDER — LIDOCAINE HCL (CARDIAC) 20 MG/ML IV SOLN
INTRAVENOUS | Status: DC | PRN
  Administered 2015-08-15: 50 mg via INTRAVENOUS

## 2015-08-15 MED ORDER — ONDANSETRON HCL 4 MG/2ML IJ SOLN
4.0000 mg | Freq: Four times a day (QID) | INTRAMUSCULAR | Status: DC | PRN
  Administered 2015-08-15: 4 mg via INTRAVENOUS
  Filled 2015-08-15: qty 2

## 2015-08-15 MED ORDER — KCL IN DEXTROSE-NACL 20-5-0.9 MEQ/L-%-% IV SOLN
INTRAVENOUS | Status: DC
  Administered 2015-08-15: 16:00:00 via INTRAVENOUS
  Filled 2015-08-15 (×3): qty 1000

## 2015-08-15 MED ORDER — LACTATED RINGERS IV SOLN
INTRAVENOUS | Status: DC | PRN
  Administered 2015-08-15 (×2): via INTRAVENOUS

## 2015-08-15 MED ORDER — FAMOTIDINE 20 MG PO TABS
20.0000 mg | ORAL_TABLET | Freq: Two times a day (BID) | ORAL | Status: DC
  Administered 2015-08-15 – 2015-08-16 (×3): 20 mg via ORAL
  Filled 2015-08-15 (×3): qty 1

## 2015-08-15 MED ORDER — SODIUM CHLORIDE 0.9 % IJ SOLN
INTRAMUSCULAR | Status: AC
  Filled 2015-08-15: qty 50

## 2015-08-15 MED ORDER — CITALOPRAM HYDROBROMIDE 20 MG PO TABS
20.0000 mg | ORAL_TABLET | Freq: Two times a day (BID) | ORAL | Status: DC
  Administered 2015-08-16: 20 mg via ORAL
  Filled 2015-08-15 (×2): qty 1

## 2015-08-15 MED ORDER — VANCOMYCIN HCL IN DEXTROSE 1-5 GM/200ML-% IV SOLN
INTRAVENOUS | Status: AC
  Administered 2015-08-15: 1 g
  Filled 2015-08-15: qty 200

## 2015-08-15 MED ORDER — BIOTIN 5000 MCG PO CAPS: 5000.0000 ug | ORAL_CAPSULE | Freq: Every day | ORAL | Status: DC

## 2015-08-15 MED ORDER — METAXALONE 800 MG PO TABS
400.0000 mg | ORAL_TABLET | Freq: Three times a day (TID) | ORAL | Status: DC
  Administered 2015-08-15 – 2015-08-16 (×2): 400 mg via ORAL
  Filled 2015-08-15 (×2): qty 1

## 2015-08-15 MED ORDER — FLEET ENEMA 7-19 GM/118ML RE ENEM: 1.0000 | ENEMA | Freq: Once | RECTAL | Status: DC | PRN

## 2015-08-15 MED ORDER — ACETAMINOPHEN 10 MG/ML IV SOLN
INTRAVENOUS | Status: DC | PRN
  Administered 2015-08-15: 1000 mg via INTRAVENOUS

## 2015-08-15 MED ORDER — RALOXIFENE HCL 60 MG PO TABS
60.0000 mg | ORAL_TABLET | Freq: Every day | ORAL | Status: DC
  Administered 2015-08-16: 60 mg via ORAL
  Filled 2015-08-15 (×3): qty 1

## 2015-08-15 MED ORDER — DEXAMETHASONE SODIUM PHOSPHATE 4 MG/ML IJ SOLN
INTRAMUSCULAR | Status: DC | PRN
  Administered 2015-08-15: 10 mg via INTRAVENOUS

## 2015-08-15 MED ORDER — ONDANSETRON HCL 4 MG/2ML IJ SOLN: 4.0000 mg | Freq: Once | INTRAMUSCULAR | Status: DC

## 2015-08-15 MED ORDER — LISINOPRIL 20 MG PO TABS
40.0000 mg | ORAL_TABLET | Freq: Every day | ORAL | Status: DC
  Administered 2015-08-16: 40 mg via ORAL
  Filled 2015-08-15: qty 2

## 2015-08-15 MED ORDER — LANSOPRAZOLE 30 MG PO CPDR
30.0000 mg | DELAYED_RELEASE_CAPSULE | Freq: Every day | ORAL | Status: DC
  Administered 2015-08-16: 30 mg via ORAL
  Filled 2015-08-15 (×2): qty 1

## 2015-08-15 MED ORDER — VITAMIN D (ERGOCALCIFEROL) 1.25 MG (50000 UNIT) PO CAPS
50000.0000 [IU] | ORAL_CAPSULE | ORAL | Status: DC
  Filled 2015-08-15: qty 1

## 2015-08-15 MED ORDER — FENTANYL CITRATE (PF) 100 MCG/2ML IJ SOLN
25.0000 ug | INTRAMUSCULAR | Status: DC | PRN
  Administered 2015-08-15 (×4): 25 ug via INTRAVENOUS

## 2015-08-15 MED ORDER — FENTANYL CITRATE (PF) 100 MCG/2ML IJ SOLN
INTRAMUSCULAR | Status: AC
  Filled 2015-08-15: qty 2

## 2015-08-15 MED ORDER — SUGAMMADEX SODIUM 200 MG/2ML IV SOLN
INTRAVENOUS | Status: DC | PRN
  Administered 2015-08-15: 200 mg via INTRAVENOUS

## 2015-08-15 MED ORDER — MAGNESIUM OXIDE 400 (241.3 MG) MG PO TABS
400.0000 mg | ORAL_TABLET | Freq: Every day | ORAL | Status: DC
  Administered 2015-08-15: 400 mg via ORAL
  Filled 2015-08-15: qty 1

## 2015-08-15 MED ORDER — NEOMYCIN-POLYMYXIN B GU 40-200000 IR SOLN
Status: AC
Start: 2015-08-15 — End: 2015-08-15
  Filled 2015-08-15: qty 4

## 2015-08-15 MED ORDER — BUPIVACAINE-EPINEPHRINE (PF) 0.5% -1:200000 IJ SOLN
INTRAMUSCULAR | Status: DC | PRN
  Administered 2015-08-15: 30 mL via PERINEURAL

## 2015-08-15 MED ORDER — SIMVASTATIN 40 MG PO TABS
40.0000 mg | ORAL_TABLET | Freq: Every day | ORAL | Status: DC
  Administered 2015-08-15: 40 mg via ORAL
  Filled 2015-08-15: qty 1

## 2015-08-15 MED ORDER — BISACODYL 10 MG RE SUPP: 10.0000 mg | Freq: Every day | RECTAL | Status: DC | PRN

## 2015-08-15 MED ORDER — LACTATED RINGERS IV SOLN
Freq: Once | INTRAVENOUS | Status: AC
  Administered 2015-08-15: 10:00:00 via INTRAVENOUS

## 2015-08-15 MED ORDER — SUCCINYLCHOLINE CHLORIDE 20 MG/ML IJ SOLN
INTRAMUSCULAR | Status: DC | PRN
  Administered 2015-08-15: 100 mg via INTRAVENOUS

## 2015-08-15 MED ORDER — FENTANYL CITRATE (PF) 100 MCG/2ML IJ SOLN
INTRAMUSCULAR | Status: DC | PRN
  Administered 2015-08-15: 100 ug via INTRAVENOUS
  Administered 2015-08-15 (×5): 50 ug via INTRAVENOUS

## 2015-08-15 MED ORDER — ACETAMINOPHEN 10 MG/ML IV SOLN
INTRAVENOUS | Status: AC
  Filled 2015-08-15: qty 100

## 2015-08-15 MED ORDER — METOCLOPRAMIDE HCL 5 MG/ML IJ SOLN: 5.0000 mg | Freq: Three times a day (TID) | INTRAMUSCULAR | Status: DC | PRN

## 2015-08-15 MED ORDER — KETOROLAC TROMETHAMINE 15 MG/ML IJ SOLN
7.5000 mg | Freq: Four times a day (QID) | INTRAMUSCULAR | Status: DC
  Administered 2015-08-15 (×2): 7.5 mg via INTRAVENOUS
  Filled 2015-08-15 (×2): qty 1

## 2015-08-15 MED ORDER — NEOMYCIN-POLYMYXIN B GU 40-200000 IR SOLN
Status: DC | PRN
  Administered 2015-08-15: 4 mL

## 2015-08-15 MED ORDER — RISAQUAD PO CAPS
1.0000 | ORAL_CAPSULE | Freq: Every day | ORAL | Status: DC
  Administered 2015-08-16: 1 via ORAL
  Filled 2015-08-15: qty 1

## 2015-08-15 MED ORDER — ACETAMINOPHEN 325 MG PO TABS: 650.0000 mg | ORAL_TABLET | Freq: Four times a day (QID) | ORAL | Status: DC | PRN

## 2015-08-15 MED ORDER — BUTALBITAL-APAP-CAFFEINE 50-325-40 MG PO TABS
1.0000 | ORAL_TABLET | ORAL | Status: DC | PRN
  Administered 2015-08-16 (×2): 1 via ORAL
  Filled 2015-08-15 (×4): qty 1

## 2015-08-15 MED ORDER — BENZONATATE 100 MG PO CAPS
200.0000 mg | ORAL_CAPSULE | Freq: Three times a day (TID) | ORAL | Status: DC | PRN
  Administered 2015-08-15: 200 mg via ORAL
  Filled 2015-08-15: qty 2

## 2015-08-15 MED ORDER — KETOROLAC TROMETHAMINE 30 MG/ML IJ SOLN
INTRAMUSCULAR | Status: AC
  Filled 2015-08-15: qty 1

## 2015-08-15 MED ORDER — HYDROXYZINE HCL 25 MG PO TABS
25.0000 mg | ORAL_TABLET | Freq: Four times a day (QID) | ORAL | Status: DC | PRN
  Administered 2015-08-15 – 2015-08-16 (×2): 25 mg via ORAL
  Filled 2015-08-15 (×2): qty 1

## 2015-08-15 MED ORDER — PROPRANOLOL HCL ER 60 MG PO CP24
60.0000 mg | ORAL_CAPSULE | Freq: Every day | ORAL | Status: DC
  Administered 2015-08-15: 60 mg via ORAL
  Filled 2015-08-15: qty 1

## 2015-08-15 MED ORDER — FENTANYL 25 MCG/HR TD PT72
25.0000 ug | MEDICATED_PATCH | TRANSDERMAL | Status: DC
  Administered 2015-08-15: 25 ug via TRANSDERMAL
  Filled 2015-08-15 (×2): qty 1

## 2015-08-15 MED ORDER — ACETAMINOPHEN 650 MG RE SUPP: 650.0000 mg | Freq: Four times a day (QID) | RECTAL | Status: DC | PRN

## 2015-08-15 MED ORDER — AMLODIPINE BESYLATE 5 MG PO TABS
5.0000 mg | ORAL_TABLET | Freq: Every day | ORAL | Status: DC
  Filled 2015-08-15: qty 1

## 2015-08-15 MED ORDER — VITAMIN B-2 100 MG PO TABS: 400.0000 mg | ORAL_TABLET | Freq: Every day | ORAL | Status: DC

## 2015-08-15 MED ORDER — ASPIRIN EC 81 MG PO TBEC
81.0000 mg | DELAYED_RELEASE_TABLET | Freq: Every day | ORAL | Status: DC
  Administered 2015-08-15: 81 mg via ORAL
  Filled 2015-08-15: qty 1

## 2015-08-15 MED ORDER — MAGNESIUM HYDROXIDE 400 MG/5ML PO SUSP: 30.0000 mL | Freq: Every day | ORAL | Status: DC | PRN

## 2015-08-15 MED ORDER — ONDANSETRON HCL 4 MG/2ML IJ SOLN: 4.0000 mg | Freq: Once | INTRAMUSCULAR | Status: DC | PRN

## 2015-08-15 MED ORDER — FOLIC ACID 1 MG PO TABS
1.0000 mg | ORAL_TABLET | Freq: Every day | ORAL | Status: DC
  Administered 2015-08-16: 1 mg via ORAL
  Filled 2015-08-15: qty 1

## 2015-08-15 MED ORDER — LORAZEPAM 2 MG PO TABS
4.0000 mg | ORAL_TABLET | Freq: Every day | ORAL | Status: DC
  Administered 2015-08-15: 4 mg via ORAL
  Filled 2015-08-15: qty 2

## 2015-08-15 MED ORDER — CEFAZOLIN SODIUM-DEXTROSE 2-3 GM-% IV SOLR
INTRAVENOUS | Status: AC
  Filled 2015-08-15: qty 50

## 2015-08-15 MED ORDER — BUPIVACAINE-EPINEPHRINE (PF) 0.5% -1:200000 IJ SOLN
INTRAMUSCULAR | Status: AC
Start: 2015-08-15 — End: 2015-08-15
  Filled 2015-08-15: qty 30

## 2015-08-15 MED ORDER — DIPHENOXYLATE-ATROPINE 2.5-0.025 MG PO TABS: 1.0000 | ORAL_TABLET | Freq: Four times a day (QID) | ORAL | Status: DC | PRN

## 2015-08-15 MED ORDER — EPHEDRINE SULFATE 50 MG/ML IJ SOLN
INTRAMUSCULAR | Status: DC | PRN
  Administered 2015-08-15 (×2): 10 mg via INTRAVENOUS

## 2015-08-15 MED ORDER — DOCUSATE SODIUM 100 MG PO CAPS
100.0000 mg | ORAL_CAPSULE | Freq: Two times a day (BID) | ORAL | Status: DC
  Filled 2015-08-15 (×3): qty 1

## 2015-08-15 MED ORDER — KETOROLAC TROMETHAMINE 30 MG/ML IJ SOLN
15.0000 mg | Freq: Once | INTRAMUSCULAR | Status: AC
  Administered 2015-08-15: 15 mg via INTRAVENOUS

## 2015-08-15 MED ORDER — KETAMINE HCL 10 MG/ML IJ SOLN
INTRAMUSCULAR | Status: DC | PRN
  Administered 2015-08-15: 20 mg via INTRAVENOUS

## 2015-08-15 MED ORDER — ONDANSETRON HCL 4 MG/2ML IJ SOLN
INTRAMUSCULAR | Status: DC | PRN
  Administered 2015-08-15: 4 mg via INTRAVENOUS

## 2015-08-15 MED ORDER — ONDANSETRON HCL 4 MG/2ML IJ SOLN
INTRAMUSCULAR | Status: AC
  Administered 2015-08-15: 4 mg
  Filled 2015-08-15: qty 2

## 2015-08-15 MED ORDER — ROCURONIUM BROMIDE 100 MG/10ML IV SOLN
INTRAVENOUS | Status: DC | PRN
  Administered 2015-08-15: 30 mg via INTRAVENOUS

## 2015-08-15 MED ORDER — VANCOMYCIN HCL IN DEXTROSE 1-5 GM/200ML-% IV SOLN
1000.0000 mg | Freq: Two times a day (BID) | INTRAVENOUS | Status: AC
  Administered 2015-08-15: 1000 mg via INTRAVENOUS
  Filled 2015-08-15: qty 200

## 2015-08-15 MED ORDER — PROMETHAZINE HCL 25 MG PO TABS
25.0000 mg | ORAL_TABLET | Freq: Four times a day (QID) | ORAL | Status: DC | PRN
  Administered 2015-08-16: 25 mg via ORAL
  Filled 2015-08-15: qty 1

## 2015-08-15 MED ORDER — FUROSEMIDE 20 MG PO TABS: 20.0000 mg | ORAL_TABLET | Freq: Every day | ORAL | Status: DC | PRN

## 2015-08-15 MED ORDER — ONDANSETRON HCL 4 MG PO TABS: 4.0000 mg | ORAL_TABLET | Freq: Four times a day (QID) | ORAL | Status: DC | PRN

## 2015-08-15 MED ORDER — BUPIVACAINE LIPOSOME 1.3 % IJ SUSP
INTRAMUSCULAR | Status: DC | PRN
  Administered 2015-08-15: 60 mL

## 2015-08-15 MED ORDER — ENOXAPARIN SODIUM 30 MG/0.3ML ~~LOC~~ SOLN
30.0000 mg | SUBCUTANEOUS | Status: DC
  Filled 2015-08-15: qty 0.3

## 2015-08-15 MED ORDER — BUPIVACAINE LIPOSOME 1.3 % IJ SUSP
INTRAMUSCULAR | Status: AC
  Filled 2015-08-15: qty 20

## 2015-08-15 MED ORDER — ALBUTEROL SULFATE (2.5 MG/3ML) 0.083% IN NEBU: 2.5000 mg | INHALATION_SOLUTION | Freq: Four times a day (QID) | RESPIRATORY_TRACT | Status: DC | PRN

## 2015-08-15 MED ORDER — METOCLOPRAMIDE HCL 5 MG PO TABS: 5.0000 mg | ORAL_TABLET | Freq: Three times a day (TID) | ORAL | Status: DC | PRN

## 2015-08-15 MED ORDER — ONDANSETRON HCL 4 MG PO TABS
4.0000 mg | ORAL_TABLET | Freq: Four times a day (QID) | ORAL | Status: DC | PRN
Start: 2015-08-15 — End: 2015-08-15

## 2015-08-15 SURGICAL SUPPLY — 73 items
BANDAGE ACE 4X5 VEL STRL LF (GAUZE/BANDAGES/DRESSINGS) ×6 IMPLANT
BIT DRILL 3.2 (BIT) ×2
BIT DRILL 3.2XCALB NS DISP (BIT) ×1 IMPLANT
BIT DRILL CALIBRATED 2.7 (BIT) ×2 IMPLANT
BIT DRILL CALIBRATED 2.7MM (BIT) ×1
BIT DRL 3.2XCALB NS DISP (BIT) ×1
BNDG COHESIVE 4X5 TAN STRL (GAUZE/BANDAGES/DRESSINGS) ×3 IMPLANT
BONE CANC CHIPS 20CC PCAN1/4 (Bone Implant) ×3 IMPLANT
CANISTER SUCT 1200ML W/VALVE (MISCELLANEOUS) ×3 IMPLANT
CHIPS CANC BONE 20CC PCAN1/4 (Bone Implant) ×1 IMPLANT
CHLORAPREP W/TINT 26ML (MISCELLANEOUS) ×6 IMPLANT
COOLER POLAR GLACIER W/PUMP (MISCELLANEOUS) ×3 IMPLANT
DECANTER SPIKE VIAL GLASS SM (MISCELLANEOUS) ×3 IMPLANT
DRAPE C-ARM XRAY 36X54 (DRAPES) ×3 IMPLANT
DRAPE INCISE IOBAN 66X45 STRL (DRAPES) ×6 IMPLANT
DRAPE U-SHAPE 47X51 STRL (DRAPES) ×3 IMPLANT
DRSG OPSITE POSTOP 4X10 (GAUZE/BANDAGES/DRESSINGS) ×3 IMPLANT
DRSG OPSITE POSTOP 4X8 (GAUZE/BANDAGES/DRESSINGS) ×3 IMPLANT
ELECT CAUTERY BLADE 6.4 (BLADE) ×3 IMPLANT
ELECT REM PT RETURN 9FT ADLT (ELECTROSURGICAL) ×3
ELECTRODE REM PT RTRN 9FT ADLT (ELECTROSURGICAL) ×1 IMPLANT
GAUZE PETRO XEROFOAM 1X8 (MISCELLANEOUS) IMPLANT
GAUZE SPONGE 4X4 12PLY STRL (GAUZE/BANDAGES/DRESSINGS) IMPLANT
GLOVE BIO SURGEON STRL SZ7.5 (GLOVE) ×6 IMPLANT
GLOVE BIO SURGEON STRL SZ8 (GLOVE) ×6 IMPLANT
GLOVE BIOGEL PI IND STRL 8 (GLOVE) ×1 IMPLANT
GLOVE BIOGEL PI INDICATOR 8 (GLOVE) ×2
GLOVE INDICATOR 8.0 STRL GRN (GLOVE) ×3 IMPLANT
GOWN STRL REUS W/ TWL LRG LVL3 (GOWN DISPOSABLE) ×2 IMPLANT
GOWN STRL REUS W/ TWL XL LVL3 (GOWN DISPOSABLE) ×1 IMPLANT
GOWN STRL REUS W/TWL LRG LVL3 (GOWN DISPOSABLE) ×4
GOWN STRL REUS W/TWL XL LVL3 (GOWN DISPOSABLE) ×2
HANDLE YANKAUER SUCT BULB TIP (MISCELLANEOUS) ×3 IMPLANT
K-WIRE 2X5 SS THRDED S3 (WIRE) ×3
KIT RM TURNOVER STRD PROC AR (KITS) ×3 IMPLANT
KIT STABILIZATION SHOULDER (MISCELLANEOUS) ×3 IMPLANT
KWIRE 2X5 SS THRDED S3 (WIRE) ×1 IMPLANT
MASK FACE SPIDER DISP (MASK) ×3 IMPLANT
NEEDLE HYPO 25X1 1.5 SAFETY (NEEDLE) ×3 IMPLANT
NEEDLE MAYO 6 CRC TAPER PT (NEEDLE) ×3 IMPLANT
NS IRRIG 1000ML POUR BTL (IV SOLUTION) ×3 IMPLANT
PACK ARTHROSCOPY SHOULDER (MISCELLANEOUS) ×3 IMPLANT
PAD CAST CTTN 4X4 STRL (SOFTGOODS) IMPLANT
PAD WRAPON POLAR SHDR XLG (MISCELLANEOUS) ×1 IMPLANT
PADDING CAST COTTON 4X4 STRL (SOFTGOODS)
PEG LOCKING 3.2MMX24MM (Peg) ×3 IMPLANT
PEG LOCKING 3.2MMX44 (Peg) ×3 IMPLANT
PEG LOCKING 3.2X32 (Peg) ×3 IMPLANT
PEG LOCKING 3.2X36 (Screw) ×3 IMPLANT
PEG LOCKING 3.2X38 (Screw) ×3 IMPLANT
PEG LOCKING 3.2X40 (Peg) ×3 IMPLANT
PLATE HUMERUS LP PROX L 3H (Plate) ×3 IMPLANT
PUTTY DBX 5CC (Putty) ×3 IMPLANT
SCREW CORTICAL LOW PROF 3.5X34 (Screw) ×3 IMPLANT
SCREW LP NL T15 3.5X22 (Screw) ×3 IMPLANT
SCREW LP NL T15 3.5X24 (Screw) ×3 IMPLANT
SCREW LP NL T15 3.5X26 (Screw) ×3 IMPLANT
SCREW T15 MD 3.5X48MM NS (Screw) ×3 IMPLANT
SLEEVE MEASURING 3.2 (BIT) ×3 IMPLANT
SLING ULTRA II LG (MISCELLANEOUS) ×3 IMPLANT
STAPLER SKIN PROX 35W (STAPLE) ×3 IMPLANT
STOCKINETTE IMPERVIOUS 9X36 MD (GAUZE/BANDAGES/DRESSINGS) ×3 IMPLANT
SUT ETHIBOND 0 MO6 C/R (SUTURE) ×3 IMPLANT
SUT FIBERWIRE #2 38 BLUE 1/2 (SUTURE) ×6
SUT FIBERWIRE #2 38 T-5 BLUE (SUTURE) ×6
SUT PROLENE 4 0 PS 2 18 (SUTURE) ×6 IMPLANT
SUT VIC AB 2-0 CT1 27 (SUTURE) ×8
SUT VIC AB 2-0 CT1 TAPERPNT 27 (SUTURE) ×4 IMPLANT
SUTURE FIBERWR #2 38 BLUE 1/2 (SUTURE) ×2 IMPLANT
SUTURE FIBERWR #2 38 T-5 BLUE (SUTURE) ×2 IMPLANT
SYR 30ML LL (SYRINGE) ×9 IMPLANT
SYRINGE 10CC LL (SYRINGE) ×3 IMPLANT
WRAPON POLAR PAD SHDR XLG (MISCELLANEOUS) ×3

## 2015-08-15 NOTE — Anesthesia Preprocedure Evaluation (Addendum)
Anesthesia Evaluation  Patient identified by MRN, date of birth, ID band Patient awake    Reviewed: Allergy & Precautions, NPO status , Patient's Chart, lab work & pertinent test results, reviewed documented beta blocker date and time   History of Anesthesia Complications (+) PONV, POST - OP SPINAL HEADACHE, Family history of anesthesia reaction and history of anesthetic complications  Airway Mallampati: II  TM Distance: >3 FB     Dental  (+) Chipped   Pulmonary asthma , former smoker,           Cardiovascular hypertension, Pt. on medications + CAD       Neuro/Psych  Headaches, PSYCHIATRIC DISORDERS Anxiety Depression  Neuromuscular disease CVA    GI/Hepatic GERD  ,  Endo/Other  Hypothyroidism   Renal/GU      Musculoskeletal  (+) Arthritis , Fibromyalgia -  Abdominal   Peds  Hematology  (+) anemia ,   Anesthesia Other Findings Obese. Wears a fentanyl patch, but not today. Anemic Hb 9.9. EKG LVH. Stress test 2009 OK. Allergic to Latex, dilaudid and Morphine and demerol. Will give only fentanyl in PACU. She denies a stroke, but probably had a TIA in 11/2005. 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  Reproductive/Obstetrics                           Anesthesia Physical Anesthesia Plan  ASA: III  Anesthesia Plan: General and Regional   Post-op Pain Management:    Induction: Intravenous  Airway Management Planned: Oral ETT  Additional Equipment:   Intra-op Plan:   Post-operative Plan:   Informed Consent: I have reviewed the patients History and Physical, chart, labs and discussed the procedure including the risks, benefits and alternatives for the proposed anesthesia with the patient or authorized representative who has indicated his/her understanding and acceptance.     Plan Discussed with: CRNA  Anesthesia Plan Comments:         Anesthesia Quick Evaluation

## 2015-08-15 NOTE — Progress Notes (Signed)
PHARMACIST - PHYSICIAN ORDER COMMUNICATION  CONCERNING: P&T Medication Policy on Herbal Medications  DESCRIPTION:  This patient's order for:  Biotin, Riboflavin  has been noted.  This product(s) is classified as an "herbal" or natural product. Due to a lack of definitive safety studies or FDA approval, nonstandard manufacturing practices, plus the potential risk of unknown drug-drug interactions while on inpatient medications, the Pharmacy and Therapeutics Committee does not permit the use of "herbal" or natural products of this type within [facility].   ACTION TAKEN: The pharmacy department is unable to verify this order at this time and your patient has been informed of this safety policy. Please reevaluate patient's clinical condition at discharge and address if the herbal or natural product(s) should be resumed at that time.

## 2015-08-15 NOTE — Anesthesia Procedure Notes (Signed)
Procedure Name: Intubation Performed by: Tyrrell Stephens Pre-anesthesia Checklist: Patient identified, Patient being monitored, Timeout performed, Emergency Drugs available and Suction available Patient Re-evaluated:Patient Re-evaluated prior to inductionOxygen Delivery Method: Circle system utilized Preoxygenation: Pre-oxygenation with 100% oxygen Intubation Type: IV induction Ventilation: Mask ventilation without difficulty Laryngoscope Size: Miller and 2 Grade View: Grade I Tube type: Oral Tube size: 7.0 mm Number of attempts: 1 Airway Equipment and Method: Stylet Placement Confirmation: ETT inserted through vocal cords under direct vision,  positive ETCO2 and breath sounds checked- equal and bilateral Secured at: 20 cm Tube secured with: Tape Dental Injury: Teeth and Oropharynx as per pre-operative assessment        

## 2015-08-15 NOTE — Transfer of Care (Signed)
Immediate Anesthesia Transfer of Care Note  Patient: Rachel Shaffer  Procedure(s) Performed: Procedure(s): OPEN REDUCTION INTERNAL FIXATION (ORIF) PROXIMAL HUMERUS FRACTURE (Left)  Patient Location: PACU  Anesthesia Type:General  Level of Consciousness: awake  Airway & Oxygen Therapy: Patient Spontanous Breathing and Patient connected to face mask oxygen  Post-op Assessment: Report given to RN  Post vital signs: Reviewed  Last Vitals:  Filed Vitals:   08/15/15 0847 08/15/15 1400  BP: 146/75 118/81  Pulse: 67 87  Temp: 36.5 C 36.4 C  Resp: 16 16    Complications: No apparent anesthesia complications

## 2015-08-15 NOTE — Op Note (Addendum)
08/15/2015  1:17 PM  Patient:   Rachel Shaffer  Pre-Op Diagnosis:   Closed two-part displaced surgical neck fracture, left proximal humerus.  Post-Op Diagnosis:   Same.  Procedure:   Open reduction and internal fixation of two-part displaced surgical neck fracture, left proximal humerus.  Surgeon:   Pascal Lux, MD  Assistant:   Cameron Proud, PA-C; Amber Race, PA-S  Anesthesia:   General endotracheal anesthesia.  Findings:   As above.  Complications:   None  EBL:   100 cc  Fluids:   1000 cc crystalloid  TT:   None  Drains:   None  Closure:   Staples  Implants:   Biomet 3-hole precontoured left proximal humerus locking plate  Brief Clinical Note:   The patient is a 65 year old female who sustained the above-noted injury nearly 4 weeks ago as the result of a fall. Initially, the fracture was felt to be sufficiently well aligned to proceed with non-surgical management. However, the patient felt a pop and painful shift in her arm this past weekend. X-rays demonstrated that the proximal humerus fracture had displaced 100% and was now unacceptably aligned. The patient presents at this time for definitive management of this injury.  Procedure:   The patient was brought into the operating room and lain in the supine position. After adequate general endotracheal intubation and anesthesia were obtained, the patient was repositioned in the beach chair position using the beach chair positioner. The left shoulder and upper extremity were prepped with ChloraPrep solution before being draped sterilely. Preoperative antibiotics were administered. After performing a timeout to verify the appropriate surgical site, a standard anterior approach the shoulder was made through an approximate 4-5 inch incision. The incision was carried down through the subcutaneous tissues to expose the deltopectoral fascia. The interval between the deltoid and pectoralis muscles was developed, retracting the cephalic  vein laterally with the deltoid. The conjoined tendon was identified and retracted medially to access the proximal humerus. The biceps tendon was identified, as was the fracture itself. The fracture hematoma was debrided and the fracture gently reduced. In addition, #2 fiber wires were placed in the supraspinatus and infraspinatus tendons to help control the proximal humeral head. Once the fracture was reduced, a 3-hole plate was selected and applied over the anterolateral aspect of the proximal humerus. A guidewire was drilled up through the plate into the humeral head and its position verified fluoroscopically in an AP view, as well as internal and external rotation views. After several adjustments, the guidewire was deemed to be in excellent position on all planes. The plate was secured to the humeral shaft using a nonlocking bicortical screw. Again the adequacy of plate position and fracture reduction was verified fluoroscopically in AP and internal and external rotation views and found to be excellent. A total of 6 locking pegs of appropriate length and 1 variable angle screw were placed in the humeral head, verifying their length and position fluoroscopically in multiple planes to be sure that the pegs had not penetrated the humeral head. The plate was then secured to the shaft using two additional nonlocking bicortical screws. The initial screw was removed as it was too long and replaced with a shorter nonlocking bicortical screw of appropriate length Again the adequacy of hardware position and fracture reduction was verified in all planes and found to be excellent. Each of the two fiber wires were passed through holes in the plate and tied securely to further stabilize the humeral head fragment to  the plate.  The wound was copiously irrigated with sterile saline solution using bulb irrigation before the area around the fracture was packed with approximately 10 cc of cancellus bone chips and 3 cc of  demineralized bone matrix. The deltopectoral interval was reapproximated using 2-0 Vicryl interrupted sutures. The subcutaneous tissues also were closed using 2-0 Vicryl interrupted sutures before the skin was closed using staples. A total of 20 cc of 0.5% Sensorcaine with epinephrine was injected along the incision site to help with postoperative analgesia. A sterile occlusive dressing was applied to the wound before the arm was placed into a shoulder immobilizer with abduction pillow. A Polar Care also was applied before the patient was awakened, extubated, and returned to the recovery room in satisfactory condition after tolerating the procedure well.

## 2015-08-15 NOTE — H&P (Signed)
Paper H&P to be scanned into permanent record. H&P reviewed. No changes. 

## 2015-08-15 NOTE — Progress Notes (Signed)
Pt arrived to floor via stretcher from PACU S/P ORIF of left humerus. Pt has fair grip and excellent radial pulse but c/o numbness and tingling sensation. PACU nurse states she reported this to Dr Roland Rack. Pt was giving medication intraoperatively that causes temporary decreased sensation. Will continue to monitor.

## 2015-08-15 NOTE — Anesthesia Postprocedure Evaluation (Signed)
Anesthesia Post Note  Patient: Rachel Shaffer  Procedure(s) Performed: Procedure(s) (LRB): OPEN REDUCTION INTERNAL FIXATION (ORIF) PROXIMAL HUMERUS FRACTURE (Left)  Patient location during evaluation: PACU Anesthesia Type: General Level of consciousness: awake Pain management: pain level controlled Vital Signs Assessment: post-procedure vital signs reviewed and stable Respiratory status: spontaneous breathing, nonlabored ventilation, respiratory function stable and patient connected to nasal cannula oxygen Cardiovascular status: blood pressure returned to baseline and stable Postop Assessment: no signs of nausea or vomiting Anesthetic complications: no    Last Vitals:  Filed Vitals:   08/15/15 1657 08/15/15 1811  BP: 137/66 146/63  Pulse: 68 70  Temp: 36.7 C 36.8 C  Resp:  18    Last Pain:  Filed Vitals:   08/15/15 1948  PainSc: [place]

## 2015-08-16 ENCOUNTER — Observation Stay: Payer: Medicare Other

## 2015-08-16 ENCOUNTER — Encounter: Payer: Self-pay | Admitting: Surgery

## 2015-08-16 DIAGNOSIS — S42222A 2-part displaced fracture of surgical neck of left humerus, initial encounter for closed fracture: Secondary | ICD-10-CM | POA: Diagnosis not present

## 2015-08-16 MED ORDER — ASPIRIN EC 81 MG PO TBEC: 162.0000 mg | DELAYED_RELEASE_TABLET | Freq: Every day | ORAL | Status: DC

## 2015-08-16 MED ORDER — OXYCODONE HCL 5 MG PO TABS: 5.0000 mg | ORAL_TABLET | ORAL | Status: DC | PRN

## 2015-08-16 NOTE — Discharge Instructions (Signed)
Diet: As you were doing prior to hospitalization   Shower:  May shower but keep the wounds dry, use an occlusive plastic wrap, NO SOAKING IN TUB.  If the bandage gets wet, change with a clean dry gauze.  Dressing:  You may change your dressing as needed. Change the dressing with sterile gauze dressing.    Activity:  Increase activity slowly as tolerated, but follow the weight bearing instructions below.  No lifting or driving for 6 weeks.  Weight Bearing:   Non-weightbearing to the left upper extremity.  To prevent constipation: you may use a stool softener such as -  Colace (over the counter) 100 mg by mouth twice a day  Drink plenty of fluids (prune juice may be helpful) and high fiber foods Miralax (over the counter) for constipation as needed.    Itching:  If you experience itching with your medications, try taking only a single pain pill, or even half a pain pill at a time.  You may take up to 10 pain pills per day, and you can also use benadryl over the counter for itching or also to help with sleep.   Precautions:  If you experience chest pain or shortness of breath - call 911 immediately for transfer to the hospital emergency department!!  If you develop a fever greater that 101 F, purulent drainage from wound, increased redness or drainage from wound, or calf pain-Call [facility]                                              Follow- Up Appointment:  Please call for an appointment to be seen in 2 weeks at [facility]

## 2015-08-16 NOTE — Discharge Summary (Signed)
Physician Discharge Summary  Patient ID: Rachel Shaffer MRN: [id number] DOB/AGE: 1950-12-06 65 y.o.  Admit date: 08/15/2015 Discharge date: 08/16/2015  Admission Diagnoses:  CLOSED DISPLACED FRACTURE OF SHAFT OF LEFT HUMERUS Closed two-part displaced surgical neck fracture, left proximal humerus.  Discharge Diagnoses: Patient Active Problem List   Diagnosis Date Noted  . Fracture of proximal humerus 08/15/2015  . HTN (hypertension) 05/11/2015  . CAD (coronary artery disease) 05/11/2015  . Anxiety 05/11/2015  . GERD (gastroesophageal reflux disease) 05/11/2015  . BRBPR (bright red blood per rectum) 05/11/2015  . HLD (hyperlipidemia) 05/11/2015  . S/P total knee replacement 04/26/2015  . Lumbar facet arthropathy 01/17/2015  . Thoracic facet joint syndrome 01/17/2015  . DDD (degenerative disc disease), thoracic 01/17/2015  . DDD (degenerative disc disease), lumbar 01/17/2015  . Bilateral occipital neuralgia 01/17/2015  . Migraine 01/17/2015    Past Medical History  Diagnosis Date  . Hypertension   . GERD (gastroesophageal reflux disease)   . Arthritis   . Fibromyalgia   . DJD (degenerative joint disease)   . Sjogren's disease ([place])   . Vitamin D deficiency   . Folic acid deficiency   . Blood transfusion without reported diagnosis   . Osteoporosis   . Stroke ([place])   . Aortic regurgitation   . Cataract   . Anxiety   . Complication of anesthesia     spinal headache  blood patch  . PONV (postoperative nausea and vomiting)   . Spinal headache   . Family history of adverse reaction to anesthesia     sister scoliosis and trouble with some anesthesia  . Coronary artery disease   . Asthma   . Bronchitis, chronic ([place])   . DDD (degenerative disc disease), cervical     all of back  . Depression   . Pain     non cardiac chest pain  . Anemia   . Hypothyroidism      Transfusion: None   Consultants (if any):    Discharged Condition: Improved  Hospital Course: Rachel Shaffer is an 65 y.o. female who was admitted 08/15/2015 with a diagnosis of <principal problem not specified> and went to the operating room on 08/15/2015 and underwent the above named procedures.    Surgeries: Procedure(s): OPEN REDUCTION INTERNAL FIXATION (ORIF) PROXIMAL HUMERUS FRACTURE on 08/15/2015 Patient tolerated the surgery well. Taken to PACU where she was stabilized and then transferred to the orthopedic floor.  Patient was started on ASA 162mg . Foot pumps applied bilaterally at 80 mm. Heels elevated on bed with rolled towels.  Pt did have right leg swelling and positive Homan's sign to the right leg.  [place] ordered for the right leg, which was negative for a DVT.  Physical therapy started on day #1 for gait training and transfer.  Patient's IV , foley removed on POD1.  Implants: Biomet 3-hole precontoured left proximal humerus locking plate  She was given perioperative antibiotics:  Anti-infectives    Start     Dose/Rate Route Frequency Ordered Stop   08/15/15 2300  vancomycin (VANCOCIN) IVPB 1000 mg/200 mL premix     1,000 mg 200 mL/hr over 60 Minutes Intravenous Every 12 hours 08/15/15 1534 08/16/15 0028   08/15/15 1025  vancomycin (VANCOCIN) 1 GM/200ML IVPB    Comments:  LEWIS, CINDY: cabinet override      08/15/15 1025 08/15/15 1047   08/15/15 0845  ceFAZolin (ANCEF) 2-3 GM-% IVPB SOLR    Comments:  Ronnell Freshwater: cabinet override  08/15/15 0845 08/15/15 2044    .  She was given sequential compression devices, early ambulation, and Aspirin for DVT prophylaxis.  She benefited maximally from the hospital stay and there were no complications.    Recent vital signs:  Filed Vitals:   08/16/15 0330 08/16/15 0826  BP: 124/51 124/46  Pulse: 71 64  Temp: 98 F (36.7 C) 98.6 F (37 C)  Resp: 18 18    Recent laboratory studies:  Lab Results  Component Value Date   HGB 9.9* 05/16/2015   HGB 9.7* 05/15/2015   HGB 9.2* 05/14/2015   Lab Results  Component Value  Date   WBC 6.8 05/16/2015   PLT 406 05/16/2015   Lab Results  Component Value Date   INR 1.00 04/12/2015   Lab Results  Component Value Date   NA 139 05/14/2015   K 4.5 05/14/2015   CL 108 05/14/2015   CO2 27 05/14/2015   BUN 10 05/14/2015   CREATININE 1.01* 05/14/2015   GLUCOSE 103* 05/14/2015    Discharge Medications:     Medication List    TAKE these medications        acidophilus Caps capsule  Take 1 capsule by mouth daily.     albuterol 108 (90 Base) MCG/ACT inhaler  Commonly known as:  PROVENTIL HFA;VENTOLIN HFA  Inhale 2 puffs into the lungs every 6 (six) hours as needed for wheezing or shortness of breath.     amLODipine 5 MG tablet  Commonly known as:  NORVASC  Take 5 mg by mouth daily.     aspirin EC 81 MG tablet  Take 81 mg by mouth at bedtime.     benzonatate 100 MG capsule  Commonly known as:  TESSALON  Take 200 mg by mouth 3 (three) times daily as needed for cough.     Biotin 5000 MCG Caps  Take 5,000 mcg by mouth daily.     butalbital-acetaminophen-caffeine 50-325-40 MG tablet  Commonly known as:  FIORICET, ESGIC  Take 1 tablet by mouth every 4 (four) hours as needed for migraine.     citalopram 20 MG tablet  Commonly known as:  CELEXA  Take 20 mg by mouth 2 (two) times daily.     diphenoxylate-atropine 2.5-0.025 MG tablet  Commonly known as:  LOMOTIL  Take 1-2 tablets by mouth every 6 (six) hours as needed for diarrhea or loose stools.     famotidine 20 MG tablet  Commonly known as:  PEPCID  Take 1 tablet (20 mg total) by mouth 2 (two) times daily.     fentaNYL 25 MCG/HR patch  Commonly known as:  DURAGESIC  Place 1 patch (25 mcg total) onto the skin every 3 (three) days.     folic acid 1 MG tablet  Commonly known as:  FOLVITE  Take 1 mg by mouth daily.     furosemide 20 MG tablet  Commonly known as:  LASIX  Take 20 mg by mouth daily as needed for edema.     hydrOXYzine 25 MG tablet  Commonly known as:  ATARAX/VISTARIL  Take  25 mg by mouth 4 (four) times daily as needed for itching.     ibuprofen 800 MG tablet  Commonly known as:  ADVIL,MOTRIN  Take 800 mg by mouth 3 (three) times daily as needed for mild pain.     lansoprazole 30 MG capsule  Commonly known as:  PREVACID  Take 30 mg by mouth 2 (two) times daily.     lidocaine  5 %  Commonly known as:  LIDODERM  Place 1 patch onto the skin daily as needed (for pain). Reported on 08/15/2015     lisinopril 40 MG tablet  Commonly known as:  PRINIVIL,ZESTRIL  Take 40 mg by mouth daily.     LORazepam 2 MG tablet  Commonly known as:  ATIVAN  Take 4 mg by mouth at bedtime.     magnesium oxide 400 MG tablet  Commonly known as:  MAG-OX  Take 400 mg by mouth at bedtime.     meperidine 50 MG tablet  Commonly known as:  DEMEROL  Take 1 tablet (50 mg total) by mouth every 4 (four) hours as needed for moderate pain or severe pain.     metaxalone 400 MG tablet  Commonly known as:  SKELAXIN  Take 1 tablet (400 mg total) by mouth 3 (three) times daily.     ondansetron 4 MG tablet  Commonly known as:  ZOFRAN  Take 1 tablet (4 mg total) by mouth every 6 (six) hours as needed for nausea.     oxyCODONE 5 MG immediate release tablet  Commonly known as:  Oxy IR/ROXICODONE  Take 1-2 tablets (5-10 mg total) by mouth every 4 (four) hours as needed for severe pain.     promethazine 25 MG tablet  Commonly known as:  PHENERGAN  Take 25 mg by mouth every 6 (six) hours as needed for nausea or vomiting.     propranolol ER 60 MG 24 hr capsule  Commonly known as:  INDERAL LA  Take 60 mg by mouth at bedtime.     raloxifene 60 MG tablet  Commonly known as:  EVISTA  Take 60 mg by mouth daily.     riboflavin 100 MG Tabs tablet  Commonly known as:  VITAMIN B-2  Take 400 mg by mouth daily.     simvastatin 40 MG tablet  Commonly known as:  ZOCOR  Take 40 mg by mouth at bedtime.     Vitamin D (Ergocalciferol) 50000 units Caps capsule  Commonly known as:  DRISDOL  Take  50,000 Units by mouth every 7 (seven) days. Pt takes on Saturday.        Diagnostic Studies: Dg Chest 1 View  07/18/2015  CLINICAL DATA:  Pt s/p fall from treadmill when treadmill unexpectedly sped up. Fell face first, c/o left shoulder pain, left elbow pain, bilateral shin bruising. EXAM: CHEST 1 VIEW COMPARISON:  CT thorax 07/22/2014, shoulder film 07/18/2015 FINDINGS: Normal cardiac silhouette.  No effusion, infiltrate, pneumothorax. Fracture of the surgical neck of the LEFT humerus with medial displacement of distal fracture fragment. IMPRESSION: 1. Fracture of the proximal LEFT humerus. 2. No acute cardiopulmonary findings. Electronically Signed   By: Suzy Bouchard M.D.   On: 07/18/2015 17:48   Dg Humerus Left  08/15/2015  CLINICAL DATA:  Intraoperative fluorospot images during ORIF for a displaced left proximal humeral metadiaphyseal fracture. EXAM: LEFT HUMERUS - 2+ VIEW; DG C-ARM 61-120 MIN COMPARISON:  Preoperative studies of the left humerus of July 18, 2015 FINDINGS: The patient has undergone interval reduction of the displaced proximal humeral metadiaphyseal fracture. There is a metallic side plate with multiple cortical screws. Alignment is near anatomic. IMPRESSION: Patient has undergone ORIF of the displaced proximal left humeral metadiaphyseal fracture. There is no immediate postprocedure complication. Electronically Signed   By: David  Martinique M.D.   On: 08/15/2015 13:32   Dg Humerus Left  07/18/2015  CLINICAL DATA:  Fall from treadmill, left shoulder  pain EXAM: LEFT HUMERUS - 2+ VIEW COMPARISON:  None. FINDINGS: Five views of the left humerus submitted. There is displaced minimal comminuted fracture of proximal shaft of the left humerus. IMPRESSION: Displaced minimal comminuted subcapital fracture proximal shaft of the left humerus. Electronically Signed   By: Lahoma Crocker M.D.   On: 07/18/2015 17:47   Dg C-arm 61-120 Min  08/15/2015  CLINICAL DATA:  Intraoperative fluorospot images  during ORIF for a displaced left proximal humeral metadiaphyseal fracture. EXAM: LEFT HUMERUS - 2+ VIEW; DG C-ARM 61-120 MIN COMPARISON:  Preoperative studies of the left humerus of July 18, 2015 FINDINGS: The patient has undergone interval reduction of the displaced proximal humeral metadiaphyseal fracture. There is a metallic side plate with multiple cortical screws. Alignment is near anatomic. IMPRESSION: Patient has undergone ORIF of the displaced proximal left humeral metadiaphyseal fracture. There is no immediate postprocedure complication. Electronically Signed   By: David  Martinique M.D.   On: 08/15/2015 13:32   Disposition: [place] negative for DVT, plan for discharge today following PT. patient is stable and ready for discharge home today with homehealth PT.  She will continue to take ASA 162mg  daily for DVT prophylaxis and will follow-up in 2 weeks with [facility].       Follow-up Information    Follow up with Judson Roch, PA-C In 14 days.   Specialty:  Physician Assistant   Why:  For staple removal, For wound re-check   Contact information:   [place] [id number] [phone]      Signed: Judson Roch PA-C 08/16/2015, 9:09 AM

## 2015-08-16 NOTE — Progress Notes (Addendum)
Subjective: 1 Day Post-Op Procedure(s) (LRB): OPEN REDUCTION INTERNAL FIXATION (ORIF) PROXIMAL HUMERUS FRACTURE (Left) Patient reports pain as 7 on 0-10 scale.   Patient is well but has minor complaints of a sharp pain in her left arm Plan is to go home with homehealth PT after hospital stay. Negative for chest pain and shortness of breath Fever: no Gastrointestinal:Positive for nausea and vomiting last night, which has resolved.  Objective: Vital signs in last 24 hours: Temp:  [97.4 F (36.3 C)-98.8 F (37.1 C)] 98 F (36.7 C) (02/01 0330) Pulse Rate:  [67-87] 71 (02/01 0330) Resp:  [16-18] 18 (02/01 0330) BP: (98-146)/(51-81) 124/51 mmHg (02/01 0330) SpO2:  [95 %-100 %] 95 % (02/01 0330) Weight:  [111.131 kg (245 lb)] 111.131 kg (245 lb) (01/31 0847)  Intake/Output from previous day:  Intake/Output Summary (Last 24 hours) at 08/16/15 0743 Last data filed at 08/16/15 0328  Gross per 24 hour  Intake 3426.25 ml  Output    100 ml  Net 3326.25 ml    Intake/Output this shift:    Labs: No results for input(s): HGB in the last 72 hours. No results for input(s): WBC, RBC, HCT, PLT in the last 72 hours. No results for input(s): NA, K, CL, CO2, BUN, CREATININE, GLUCOSE, CALCIUM in the last 72 hours. No results for input(s): LABPT, INR in the last 72 hours.   EXAM General - Patient is Alert, Appropriate and Oriented Extremity - ABD soft Neurovascular intact Sensation intact distally Intact pulses distally Incision: dressing C/D/I No cellulitis present Dressing/Incision - clean, dry, no drainage Motor Function - intact, moving foot and toes well on exam.  Abdomen is soft on exam with normal bowel sounds without tympany.  Past Medical History  Diagnosis Date  . Hypertension   . GERD (gastroesophageal reflux disease)   . Arthritis   . Fibromyalgia   . DJD (degenerative joint disease)   . Sjogren's disease ([place])   . Vitamin D deficiency   . Folic acid deficiency   .  Blood transfusion without reported diagnosis   . Osteoporosis   . Stroke ([place])   . Aortic regurgitation   . Cataract   . Anxiety   . Complication of anesthesia     spinal headache  blood patch  . PONV (postoperative nausea and vomiting)   . Spinal headache   . Family history of adverse reaction to anesthesia     sister scoliosis and trouble with some anesthesia  . Coronary artery disease   . Asthma   . Bronchitis, chronic ([place])   . DDD (degenerative disc disease), cervical     all of back  . Depression   . Pain     non cardiac chest pain  . Anemia   . Hypothyroidism     Assessment/Plan: 1 Day Post-Op Procedure(s) (LRB): OPEN REDUCTION INTERNAL FIXATION (ORIF) PROXIMAL HUMERUS FRACTURE (Left) Active Problems:   Fracture of proximal humerus  Estimated body mass index is 43.41 kg/(m^2) as calculated from the following:   Height as of this encounter: 5\' 3"  (1.6 m).   Weight as of this encounter: 111.131 kg (245 lb). Advance diet Up with therapy D/C IV fluids when tolerating PO intake.  Plan on discharge home today following PT with homehealth PT.  DVT Prophylaxis - Aspirin Non-weight bearing to the left arm.   Addendum: Pt called me back into the room complaining of right leg pain and swelling.  That has been ongoing for 2 weeks, she denies any injury to  the leg. Positive Homan's sign, will obtain Doppler of right leg to rule out a blood clot.  Raquel Krosby Ritchie, PA-C [facility] Orthopaedic Surgery 08/16/2015, 7:43 AM

## 2015-08-16 NOTE — Progress Notes (Signed)
PT Cancellation Note  Patient Details Name: Rachel Shaffer MRN: [id number] DOB: 09-Mar-1951   Cancelled Treatment:    Reason Eval/Treat Not Completed: Medical issues which prohibited therapy. PT reviewed chart and noted that PA found positive Homan's sign and ordered US to rule out DVT in RLE. PT will hold mobility evaluation until results provided for test.   Kerman Passey, PT, DPT    08/16/2015, 8:33 AM

## 2015-08-16 NOTE — Care Management Obs Status (Signed)
[place] NOTIFICATION   Patient Details  Name: Rachel Shaffer MRN: [id number] Date of Birth: 1951/01/17   Medicare Observation Status Notification Given:  Yes    Marshell Garfinkel, RN 08/16/2015, 7:50 AM

## 2015-08-16 NOTE — Care Management (Signed)
I have notified Corene Cornea with [facility] of patient discharge. No further RNCM needs. Case closed.

## 2015-08-16 NOTE — Evaluation (Signed)
Physical Therapy Evaluation Patient Details Name: Rachel Shaffer MRN: [id number] DOB: [birth year] Today's Date: 08/16/2015   History of Present Illness  Patient recently fell while ambulating on treadmill at home for exercise and sustained L humeral fx, which she appears for now for repair. Original date of fx is 07/18/2015. She recently had R TKA on 04/26/15  Clinical Impression  Patient recently admitted for R TKA in October of 2016, she had been doing well and was ambulating on a treadmill prior to this admission where she fell and sustained a L Humeral fx. She received ORIF for management and is now NWB on her LUE (which she has essentially made herself for the past month). She is able to ambulate in this session with a SPC with no loss of balance and appropriate technique, as she had at home. She does demonstrate deficits in bringing her LEs over the edge of the bed, however she typically sleeps in a lift chair. Patient appears at her recent baseline in terms of ambulation, however with her pain in her shoulder (rated as 8/10) PT declined any passive movements of the LUE for now. Would recommend skilled [facility] PT services to address her balance and begin PROM to LUE.    Follow Up Recommendations Home health PT    Equipment Recommendations       Recommendations for Other Services       Precautions / Restrictions Precautions Precautions: Fall Required Braces or Orthoses: Sling Restrictions Weight Bearing Restrictions: Yes LUE Weight Bearing: Non weight bearing      Mobility  Bed Mobility Overal bed mobility: Needs Assistance Bed Mobility: Supine to Sit;Sit to Supine     Supine to sit: Min assist;Min guard Sit to supine: Min guard;Min assist   General bed mobility comments: Patient typically sleeps in recliner/lift chair. She requires minimal assistance for managing LEs to transfer over the edge of the bed for both transfers. No assistance for trunk required.   Transfers Overall  transfer level: Modified independent Equipment used: Straight cane             General transfer comment: No deficit noted in sit to stand transfer from either recliner or bed, mild prolongation of time to complete from bed.   Ambulation/Gait Ambulation/Gait assistance: Modified independent (Device/Increase time) Ambulation Distance (Feet): 60 Feet Assistive device: Straight cane Gait Pattern/deviations: Step-through pattern   Gait velocity interpretation: Below normal speed for age/gender General Gait Details: Patient initially attempted ambulation with PT with QC, however she prefers SPC. No loss of balance in limited ambulation with appropriate technique demonstrated with SPC.   Stairs            Wheelchair Mobility    Modified Rankin (Stroke Patients Only)       Balance Overall balance assessment: No apparent balance deficits (not formally assessed)                                           Pertinent Vitals/Pain Pain Assessment:  (Patient reports pain in the anterior portion of her L shoulder, pointing near [facility] joint. RN notified for pain medications)    Home Living Family/patient expects to be discharged to:: Private residence Living Arrangements: Spouse/significant other Available Help at Discharge: Family;Available 24 hours/day Type of Home: House Home Access: Level entry     Home Layout: One level Home Equipment: Walker - 2 wheels;Shower seat - built  in;Grab bars - tub/shower;Grab bars - toilet;Cane - single point;Walker - 4 wheels;Bedside commode;Electric scooter (Lift chair)      Prior Function Level of Independence: Independent with assistive device(s)         Comments: Patient reports using a SPC in her RUE the past few weeks for very limited mobility around the house.      Hand Dominance        Extremity/Trunk Assessment   Upper Extremity Assessment: LUE deficits/detail           Lower Extremity Assessment:  Overall WFL for tasks assessed         Communication   Communication: No difficulties  Cognition Arousal/Alertness: Awake/alert Behavior During Therapy: WFL for tasks assessed/performed Overall Cognitive Status: Within Functional Limits for tasks assessed                      General Comments General comments (skin integrity, edema, etc.): Sling on LUE appeared to be somewhat large, RN stated medium size did not fit her.     Exercises        Assessment/Plan    PT Assessment Patient needs continued PT services  PT Diagnosis Difficulty walking;Generalized weakness   PT Problem List Decreased strength;Decreased safety awareness;Pain;Decreased balance;Decreased range of motion;Decreased mobility  PT Treatment Interventions DME instruction;Gait training;Therapeutic activities;Therapeutic exercise;Manual techniques   PT Goals (Current goals can be found in the Care Plan section) Acute Rehab PT Goals Patient Stated Goal: Return home safely  PT Goal Formulation: With patient Time For Goal Achievement: 08/30/15 Potential to Achieve Goals: Good    Frequency BID   Barriers to discharge        Co-evaluation               End of Session Equipment Utilized During Treatment: Gait belt (Shoulder sling) Activity Tolerance: Patient tolerated treatment well Patient left: in bed;with call bell/phone within reach;with bed alarm set Nurse Communication: Mobility status;Patient requests pain meds    Functional Assessment Tool Used: Clinical judgement  Functional Limitation: Mobility: Walking and moving around Mobility: Walking and Moving Around Current Status [phone]): At least 20 percent but less than 40 percent impaired, limited or restricted Mobility: Walking and Moving Around Goal Status [phone]): At least 20 percent but less than 40 percent impaired, limited or restricted Carrying, Moving and Handling Objects Current Status [phone]): 100 percent impaired, limited or  restricted    Time: [id number] PT Time Calculation (min) (ACUTE ONLY): 21 min   Charges:   PT Evaluation $PT Eval Moderate Complexity: 1 Procedure     PT G Codes:   PT G-Codes **NOT FOR INPATIENT CLASS** Functional Assessment Tool Used: Clinical judgement  Functional Limitation: Mobility: Walking and moving around Mobility: Walking and Moving Around Current Status [id number]): At least 20 percent but less than 40 percent impaired, limited or restricted Mobility: Walking and Moving Around Goal Status [phone]): At least 20 percent but less than 40 percent impaired, limited or restricted Carrying, Moving and Handling Objects Current Status [phone]): 100 percent impaired, limited or restricted    Kerman Passey, PT, DPT    08/16/2015, 5:54 PM

## 2015-08-16 NOTE — Progress Notes (Signed)
Clinical Education officer, museum (CSW) received SNF consult. PT is recommending home health. RN Case Manager aware of above. Please reconsult if future social work needs arise. CSW signing off.   Blima Rich, LCSW [phone]

## 2015-08-16 NOTE — Care Management Note (Signed)
Case Management Note  Patient Details  Name: Rachel Shaffer MRN: [id number] Date of Birth: 08/26/1950  Subjective/Objective:                  Met with patient and her husband to discuss discharge planning. She has been to [facility] for rehab in the past. She wants to return home this visit. She uses a cane to ambulate and sometimes a rollator which will be difficult due to arm surgery. She would like to use [facility] and asked for [facility] PT. She uses [facility] on [address] for Rx. She wants to go home today.    Action/Plan: List of home health agencies presented to patient. Referral called to [facility] with [place]. RNCM will continue to follow.   Expected Discharge Date:                  Expected Discharge Plan:     In-House Referral:     Discharge planning Services     Post Acute Care Choice:  Home Health Choice offered to:  Patient, Spouse  DME Arranged:    DME Agency:     HH Arranged:  PT [place]:  [place]  Status of Service:  In process, will continue to follow  Medicare Important Message Given:    Date Medicare IM Given:    Medicare IM give by:    Date Additional Medicare IM Given:    Additional Medicare Important Message give by:     If discussed at [place] of Stay Meetings, dates discussed:    Additional Comments:  Marshell Garfinkel, RN 08/16/2015, 9:23 AM

## 2015-08-16 NOTE — Progress Notes (Signed)
Spouse here to take patient home. Discharge via wheelchair with nursing staff at side.

## 2015-08-16 NOTE — Progress Notes (Signed)
Order to discharge patient to home today with home health, discharge paperwork reviewed with patient, rx.slip given for oxycodone. Patient verbalized understanding of discharge instructions given. Waiting for spouse to take patient home.

## 2015-08-17 ENCOUNTER — Encounter: Payer: Self-pay | Admitting: Surgery

## 2015-09-20 ENCOUNTER — Other Ambulatory Visit: Payer: Self-pay | Admitting: Internal Medicine

## 2015-09-20 DIAGNOSIS — R05 Cough: Secondary | ICD-10-CM

## 2015-09-20 DIAGNOSIS — R053 Chronic cough: Secondary | ICD-10-CM

## 2015-09-22 ENCOUNTER — Encounter: Payer: Self-pay | Admitting: Surgery

## 2015-09-25 ENCOUNTER — Ambulatory Visit
Admission: RE | Admit: 2015-09-25 | Discharge: 2015-09-25 | Disposition: A | Discharge status: Home / Self Care | Payer: Medicare Other | Source: Ambulatory Visit | Attending: Internal Medicine | Admitting: Internal Medicine

## 2015-09-25 DIAGNOSIS — I2510 Atherosclerotic heart disease of native coronary artery without angina pectoris: Secondary | ICD-10-CM | POA: Insufficient documentation

## 2015-09-25 DIAGNOSIS — Z9889 Other specified postprocedural states: Secondary | ICD-10-CM | POA: Insufficient documentation

## 2015-09-25 DIAGNOSIS — K449 Diaphragmatic hernia without obstruction or gangrene: Secondary | ICD-10-CM | POA: Diagnosis not present

## 2015-09-25 DIAGNOSIS — R05 Cough: Secondary | ICD-10-CM | POA: Insufficient documentation

## 2015-09-25 DIAGNOSIS — R053 Chronic cough: Secondary | ICD-10-CM

## 2015-11-21 ENCOUNTER — Other Ambulatory Visit: Payer: Self-pay | Admitting: Internal Medicine

## 2015-11-21 DIAGNOSIS — R109 Unspecified abdominal pain: Secondary | ICD-10-CM

## 2015-11-28 ENCOUNTER — Ambulatory Visit
Admission: RE | Admit: 2015-11-28 | Discharge: 2015-11-28 | Disposition: A | Discharge status: Home / Self Care | Payer: Medicare Other | Source: Ambulatory Visit | Attending: Internal Medicine | Admitting: Internal Medicine

## 2015-11-28 DIAGNOSIS — N271 Small kidney, bilateral: Secondary | ICD-10-CM | POA: Insufficient documentation

## 2015-11-28 DIAGNOSIS — Z9049 Acquired absence of other specified parts of digestive tract: Secondary | ICD-10-CM | POA: Insufficient documentation

## 2015-11-28 DIAGNOSIS — R109 Unspecified abdominal pain: Secondary | ICD-10-CM

## 2016-01-17 IMAGING — MR MR KNEE*R* W/O CM
6 series · 38 of 40 positions shown · non-contrast
Comparison: Report of MRI dated 02/04/2006

CLINICAL DATA: Right knee pain.

EXAM:
MRI OF THE RIGHT KNEE WITHOUT CONTRAST
TECHNIQUE: Multiplanar, multisequence MR imaging of the knee was performed. No
intravenous contrast was administered.

[Series 3: PD fat-sat · axial · 3.0mm · 0.29mm/px · z∈[-64,+35]mm · 8 of 31 slices shown (1 of 3)]
[im 1/31]
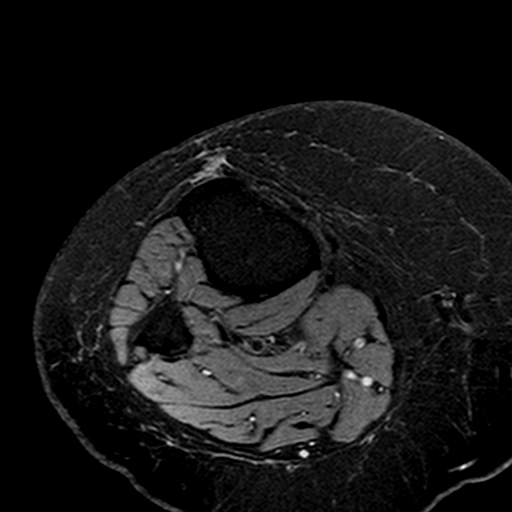
[im 5/31]
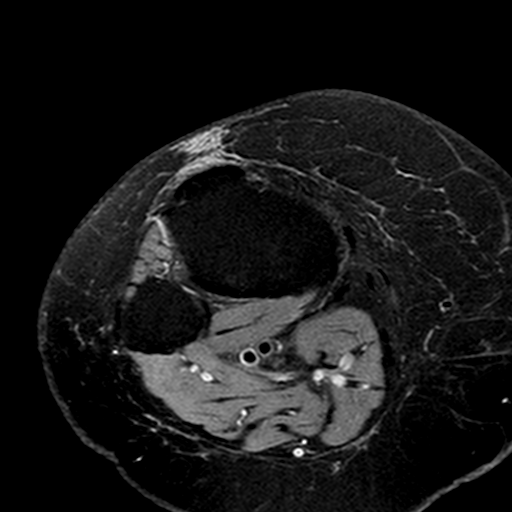
[im 9/31]
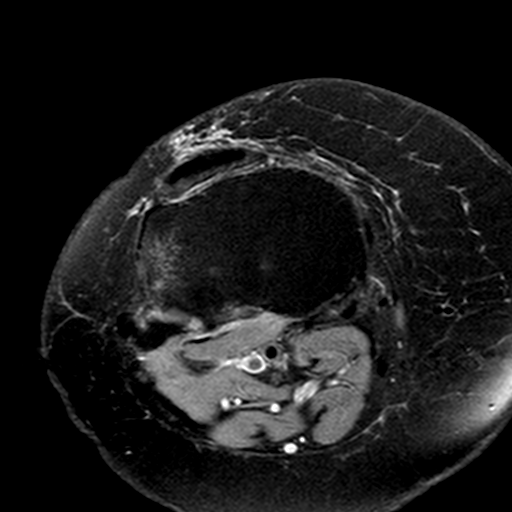
[im 13/31]
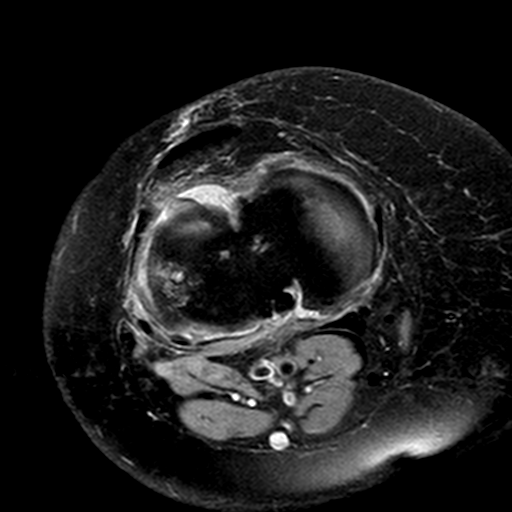
[im 18/31]
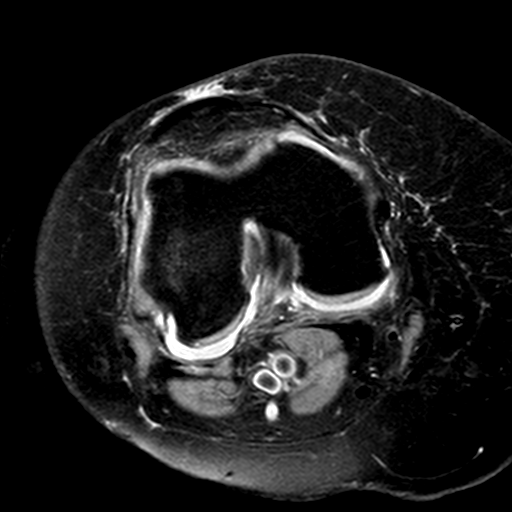
[im 22/31]
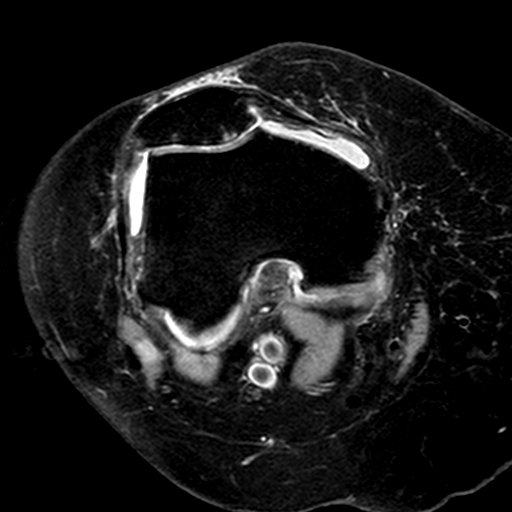
[im 26/31]
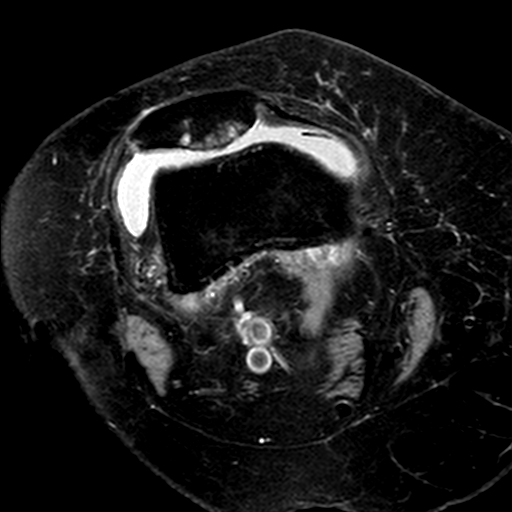
[im 31/31]
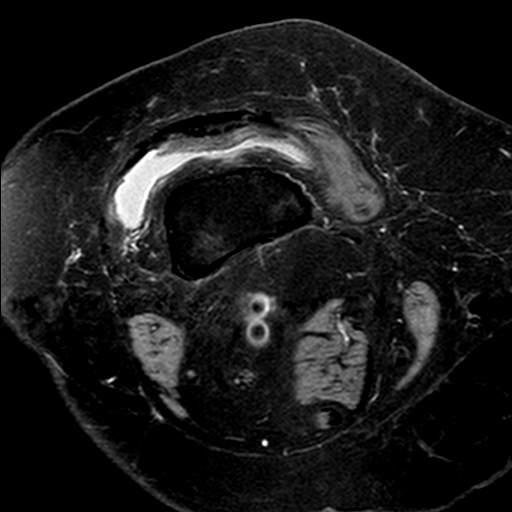

[Series 4: T2 fat-sat · coronal · 3.0mm · 0.62mm/px · 7 of 29 slices shown]
[im 1/29]
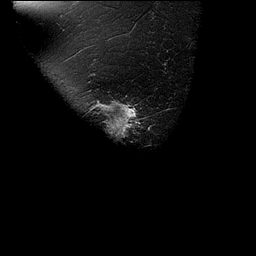
[im 5/29]
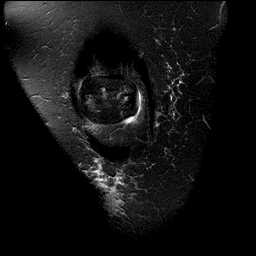
[im 10/29]
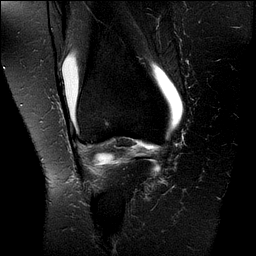
[im 15/29]
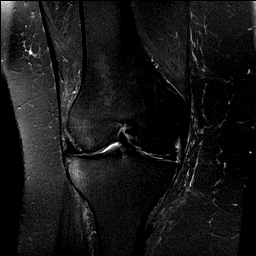
[im 19/29]
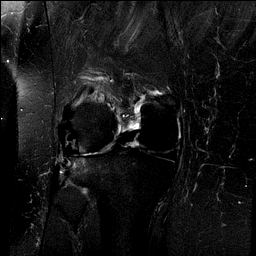
[im 24/29]
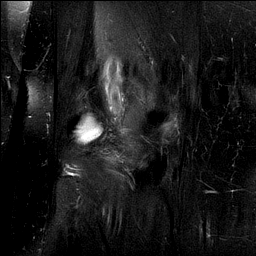
[im 29/29]
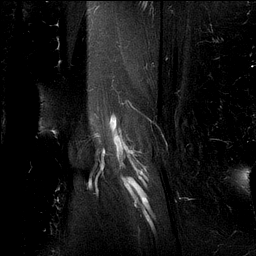

[Series 5: PD fat-sat · sagittal · 3.0mm · 0.62mm/px · 8 of 32 slices shown (2 of 3)]
[im 1/32]
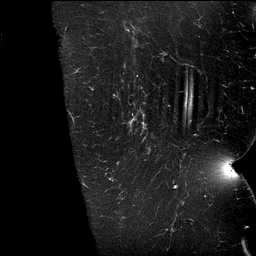
[im 5/32]
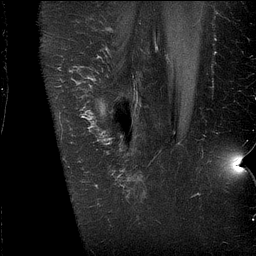
[im 9/32]
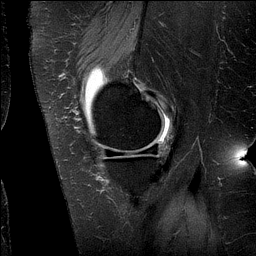
[im 14/32]
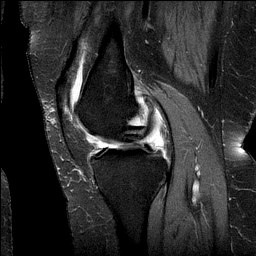
[im 18/32]
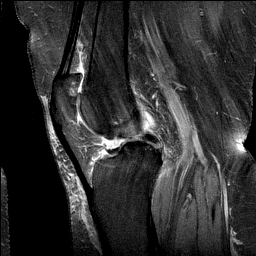
[im 23/32]
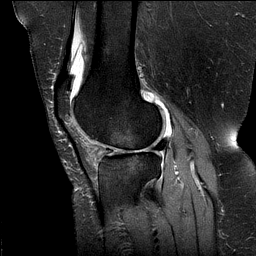
[im 27/32]
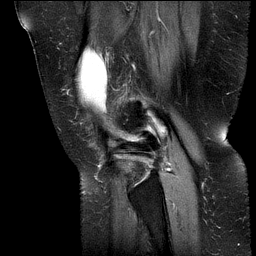
[im 32/32]
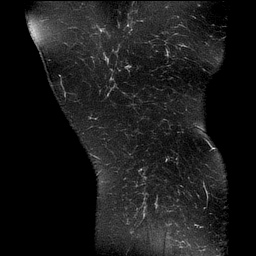

[Series 6: PD fat-sat · coronal · 3.0mm · 0.62mm/px · 7 of 29 slices shown (3 of 3)]
[im 1/29]
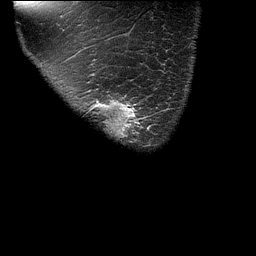
[im 5/29]
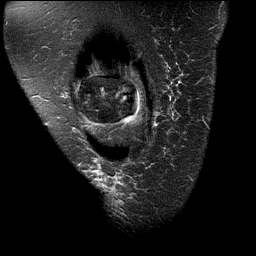
[im 10/29]
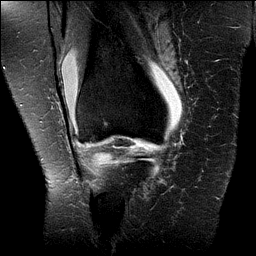
[im 15/29]
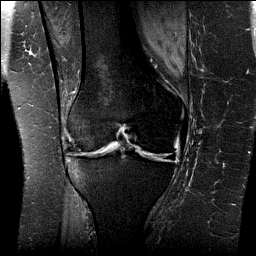
[im 19/29]
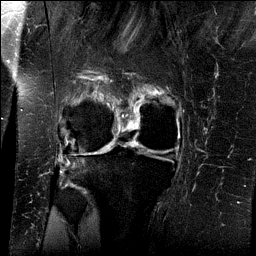
[im 24/29]
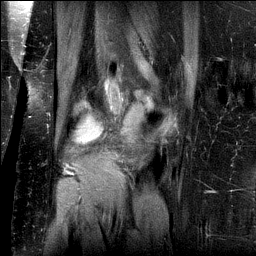
[im 29/29]
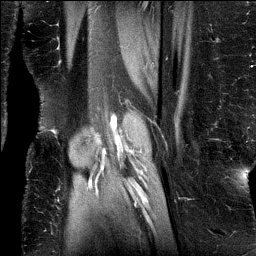

[Series 7: T1 · coronal · 3.0mm · 0.62mm/px · 5 of 29 slices shown]
[im 1/29]
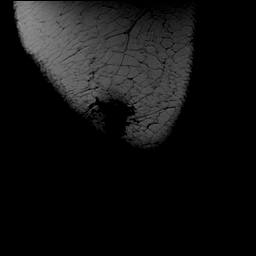
[im 5/29]
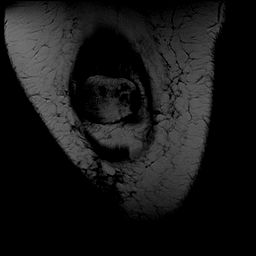
[im 10/29]
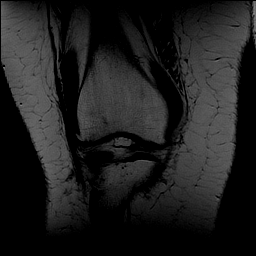
[im 15/29]
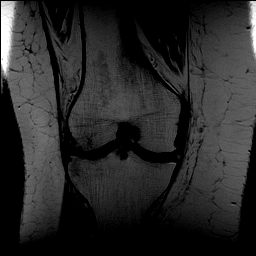
[im 19/29]
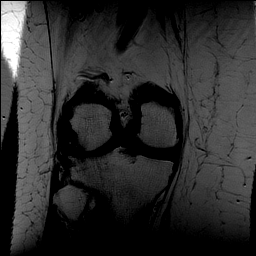

[Series 8: PD · oblique · 3.0mm · 0.31mm/px · 3 of 11 slices shown]
[im 1/11]
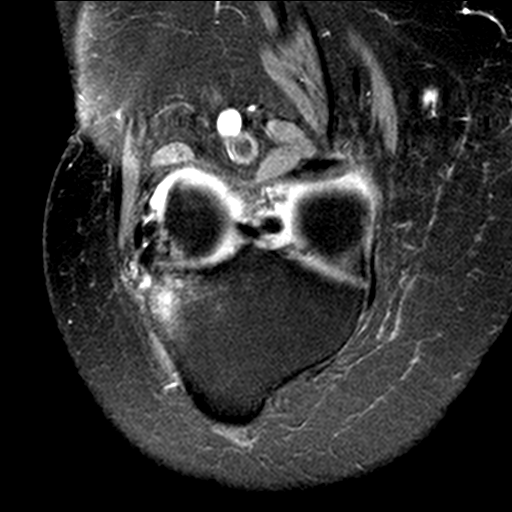
[im 6/11]
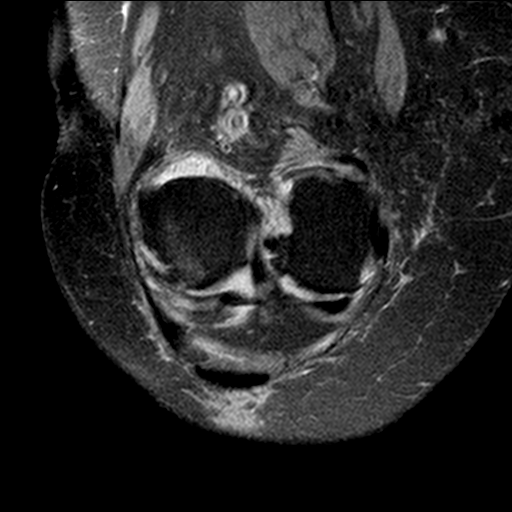
[im 11/11]
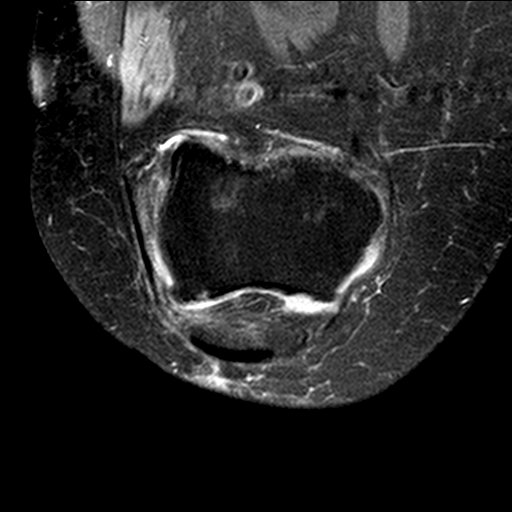

[38 of 40 positions shown; findings below may reference images not displayed]

FINDINGS: MENISCI

Medial meniscus: Slight intrinsic degeneration of the posterior
horn. No tear.

Lateral meniscus: There is an oblique tear of the anterior horn at
the anterior lateral corner with degeneration of the midbody with
peripheral subluxation.

LIGAMENTS

Cruciates:  Normal.

Collaterals: Intact. Thickening of the proximal MCL suggests remote
sprain.

CARTILAGE

Patellofemoral: Extensive grade 4 chondromalacia of the patella.
Multifocal small areas of grade 4 chondromalacia of the trochlear
groove of the distal femur.

Medial: Slight irregularity of the articular cartilage of femoral
condyle.

Lateral: Grade 4 chondromalacia of the central and peripheral
portions tibial plateau and of the central portion of the femoral
condyle. Lateral joint space narrowing with marginal osteophytes,
subcortical cysts, and subcortical edema.

Joint:  Moderate joint effusion.  No visible loose bodies.

Popliteal Fossa:  Normal.

Extensor Mechanism:  Normal.

Bones:  The extra-articular bones are normal.
IMPRESSION: 1. Moderately severe arthritis of the patellofemoral and lateral
compartments.
2. Oblique tear of the anterior horn of the lateral meniscus.

## 2016-05-02 ENCOUNTER — Other Ambulatory Visit: Payer: Self-pay | Admitting: Internal Medicine

## 2016-05-02 DIAGNOSIS — Z1231 Encounter for screening mammogram for malignant neoplasm of breast: Secondary | ICD-10-CM

## 2016-05-10 ENCOUNTER — Ambulatory Visit
Admission: RE | Admit: 2016-05-10 | Discharge: 2016-05-10 | Disposition: A | Discharge status: Home / Self Care | Payer: Medicare Other | Source: Ambulatory Visit | Attending: Internal Medicine | Admitting: Internal Medicine

## 2016-05-10 DIAGNOSIS — Z1231 Encounter for screening mammogram for malignant neoplasm of breast: Secondary | ICD-10-CM | POA: Diagnosis not present

## 2016-06-12 ENCOUNTER — Other Ambulatory Visit: Payer: Self-pay | Admitting: Internal Medicine

## 2016-06-12 DIAGNOSIS — R270 Ataxia, unspecified: Secondary | ICD-10-CM

## 2016-06-12 DIAGNOSIS — G44229 Chronic tension-type headache, not intractable: Secondary | ICD-10-CM

## 2016-07-01 ENCOUNTER — Ambulatory Visit
Admission: RE | Admit: 2016-07-01 | Discharge: 2016-07-01 | Disposition: A | Discharge status: Home / Self Care | Payer: Medicare Other | Source: Ambulatory Visit | Attending: Internal Medicine | Admitting: Internal Medicine

## 2016-07-01 ENCOUNTER — Ambulatory Visit: Payer: Medicare Other

## 2016-07-01 DIAGNOSIS — G44229 Chronic tension-type headache, not intractable: Secondary | ICD-10-CM | POA: Insufficient documentation

## 2016-07-01 DIAGNOSIS — Z86011 Personal history of benign neoplasm of the brain: Secondary | ICD-10-CM | POA: Insufficient documentation

## 2016-07-01 DIAGNOSIS — R270 Ataxia, unspecified: Secondary | ICD-10-CM

## 2016-07-01 MED ORDER — GADOBENATE DIMEGLUMINE 529 MG/ML IV SOLN
20.0000 mL | Freq: Once | INTRAVENOUS | Status: AC | PRN
  Administered 2016-07-01: 20 mL via INTRAVENOUS

## 2016-09-11 ENCOUNTER — Telehealth: Payer: Self-pay

## 2016-09-11 NOTE — Telephone Encounter (Signed)
Received referral from Dr. Georgie Chard at [facility] for lbpu dx  Chronic bronchitis.  Patient unable to schedule an appointment bnot feeling well.  Asked for office to call back on Monday.

## 2016-10-28 ENCOUNTER — Ambulatory Visit: Payer: Medicare Other | Admitting: Internal Medicine

## 2016-11-08 ENCOUNTER — Ambulatory Visit
Admission: RE | Admit: 2016-11-08 | Discharge: 2016-11-08 | Disposition: A | Discharge status: Home / Self Care | Payer: Medicare Other | Source: Ambulatory Visit | Attending: Surgery | Admitting: Surgery

## 2016-11-08 ENCOUNTER — Other Ambulatory Visit: Payer: Self-pay | Admitting: Surgery

## 2016-11-08 DIAGNOSIS — M19012 Primary osteoarthritis, left shoulder: Secondary | ICD-10-CM | POA: Diagnosis not present

## 2016-11-08 DIAGNOSIS — I7090 Unspecified atherosclerosis: Secondary | ICD-10-CM | POA: Diagnosis not present

## 2016-11-08 DIAGNOSIS — S42222K 2-part displaced fracture of surgical neck of left humerus, subsequent encounter for fracture with nonunion: Secondary | ICD-10-CM | POA: Diagnosis present

## 2016-11-11 ENCOUNTER — Telehealth: Payer: Self-pay

## 2016-11-11 NOTE — Telephone Encounter (Signed)
Patient calling after received letter to schedule new patient referral appt.  Patient will call to schedule after she has surgery and can ambulate outside of home for appt.

## 2016-11-12 ENCOUNTER — Encounter
Admission: RE | Admit: 2016-11-12 | Discharge: 2016-11-12 | Disposition: A | Discharge status: Home / Self Care | Payer: Medicare Other | Source: Ambulatory Visit | Attending: Surgery | Admitting: Surgery

## 2016-11-12 DIAGNOSIS — Z7982 Long term (current) use of aspirin: Secondary | ICD-10-CM | POA: Diagnosis not present

## 2016-11-12 DIAGNOSIS — Z6841 Body Mass Index (BMI) 40.0 and over, adult: Secondary | ICD-10-CM | POA: Diagnosis not present

## 2016-11-12 DIAGNOSIS — X58XXXD Exposure to other specified factors, subsequent encounter: Secondary | ICD-10-CM | POA: Diagnosis not present

## 2016-11-12 DIAGNOSIS — Z79899 Other long term (current) drug therapy: Secondary | ICD-10-CM | POA: Diagnosis not present

## 2016-11-12 DIAGNOSIS — Z91040 Latex allergy status: Secondary | ICD-10-CM | POA: Diagnosis not present

## 2016-11-12 DIAGNOSIS — Z7951 Long term (current) use of inhaled steroids: Secondary | ICD-10-CM | POA: Diagnosis not present

## 2016-11-12 DIAGNOSIS — Z91041 Radiographic dye allergy status: Secondary | ICD-10-CM | POA: Diagnosis not present

## 2016-11-12 DIAGNOSIS — E785 Hyperlipidemia, unspecified: Secondary | ICD-10-CM | POA: Diagnosis not present

## 2016-11-12 DIAGNOSIS — M1990 Unspecified osteoarthritis, unspecified site: Secondary | ICD-10-CM | POA: Diagnosis not present

## 2016-11-12 DIAGNOSIS — Z882 Allergy status to sulfonamides status: Secondary | ICD-10-CM | POA: Diagnosis not present

## 2016-11-12 DIAGNOSIS — K219 Gastro-esophageal reflux disease without esophagitis: Secondary | ICD-10-CM | POA: Diagnosis not present

## 2016-11-12 DIAGNOSIS — E669 Obesity, unspecified: Secondary | ICD-10-CM | POA: Diagnosis not present

## 2016-11-12 DIAGNOSIS — S42209A Unspecified fracture of upper end of unspecified humerus, initial encounter for closed fracture: Secondary | ICD-10-CM | POA: Diagnosis present

## 2016-11-12 DIAGNOSIS — S42222K 2-part displaced fracture of surgical neck of left humerus, subsequent encounter for fracture with nonunion: Secondary | ICD-10-CM | POA: Diagnosis not present

## 2016-11-12 DIAGNOSIS — I10 Essential (primary) hypertension: Secondary | ICD-10-CM | POA: Diagnosis not present

## 2016-11-12 DIAGNOSIS — Z79891 Long term (current) use of opiate analgesic: Secondary | ICD-10-CM | POA: Diagnosis not present

## 2016-11-12 DIAGNOSIS — R262 Difficulty in walking, not elsewhere classified: Secondary | ICD-10-CM | POA: Diagnosis not present

## 2016-11-12 DIAGNOSIS — Z791 Long term (current) use of non-steroidal anti-inflammatories (NSAID): Secondary | ICD-10-CM | POA: Diagnosis not present

## 2016-11-12 DIAGNOSIS — I351 Nonrheumatic aortic (valve) insufficiency: Secondary | ICD-10-CM | POA: Diagnosis not present

## 2016-11-12 DIAGNOSIS — I2510 Atherosclerotic heart disease of native coronary artery without angina pectoris: Secondary | ICD-10-CM | POA: Diagnosis not present

## 2016-11-12 DIAGNOSIS — Z888 Allergy status to other drugs, medicaments and biological substances status: Secondary | ICD-10-CM | POA: Diagnosis not present

## 2016-11-12 DIAGNOSIS — Z91010 Allergy to peanuts: Secondary | ICD-10-CM | POA: Diagnosis not present

## 2016-11-12 DIAGNOSIS — Z885 Allergy status to narcotic agent status: Secondary | ICD-10-CM | POA: Diagnosis not present

## 2016-11-12 DIAGNOSIS — F419 Anxiety disorder, unspecified: Secondary | ICD-10-CM | POA: Diagnosis not present

## 2016-11-12 DIAGNOSIS — Z90710 Acquired absence of both cervix and uterus: Secondary | ICD-10-CM | POA: Diagnosis not present

## 2016-11-12 DIAGNOSIS — Z87891 Personal history of nicotine dependence: Secondary | ICD-10-CM | POA: Diagnosis not present

## 2016-11-12 HISTORY — DX: Arteritis, unspecified: I77.6

## 2016-11-12 HISTORY — DX: Hyperlipidemia, unspecified: E78.5

## 2016-11-12 LAB — SEDIMENTATION RATE: Sed Rate: 39 mm/hr — ABNORMAL HIGH (ref 0–30)

## 2016-11-12 LAB — C-REACTIVE PROTEIN: CRP: <0.8 mg/dL (ref <1.0)

## 2016-11-12 NOTE — Patient Instructions (Signed)
  Your procedure is scheduled on: Thursday, Nov 14, 2016 Report to Same Day Surgery 2nd floor medical mall [facility] Entrance-take elevator on left to 2nd floor.  Check in with surgery information desk.) To find out your arrival time please call [phone] between 1PM - 3PM on Wednesday, May 2  Remember: Instructions that are not followed completely may result in serious medical risk, up to and including death, or upon the discretion of your surgeon and anesthesiologist your surgery may need to be rescheduled.    _x___ 1. Do not eat food or drink liquids after midnight. No gum chewing or hard candies.      __x__ 2. No Alcohol for 24 hours before or after surgery.   __x__3. No Smoking for 24 prior to surgery.   ____  4. Bring all medications with you on the day of surgery if instructed.    __x__ 5. Notify your doctor if there is any change in your medical condition     (cold, fever, infections).     Do not wear jewelry, make-up, hairpins, clips or nail polish.  Do not wear lotions, powders, or perfumes. You may wear deodorant.  Do not shave 48 hours prior to surgery. Men may shave face and neck.  Do not bring valuables to the hospital.    [facility] is not responsible for any belongings or valuables.               Contacts, dentures or bridgework may not be worn into surgery.     Patients discharged the day of surgery will not be allowed to drive home.  You will need someone to drive you home and stay with you the night of your procedure.    Please read over the following fact sheets that you were given:   [facility] Preparing for Surgery and or MRSA Information   _x___ Take these medications the morning of surgery with a SIP of water only:   1. CELEXA  2. PEPCID  3. PREVACID  4. LISINOPRIL    _x___ Use CHG Soap or sage wipes as directed on instruction sheet   _X___ Use inhalers on the day of surgery (ADVAIR) and bring to hospital day of surgery    _x___ Follow  recommendations from Cardiologist, Pulmonologist or PCP regarding stopping Aspirin.  X____Stop Anti-inflammatories such as Advil, Aleve, Ibuprofen, Motrin, Naproxen, Naprosyn, Goodies powders or aspirin products. OK to take Tylenol and Celebrex.   _x___ Stop supplements until after surgery. But may continue Vitamin D, Vitamin B, and multivitamin.

## 2016-11-12 NOTE — Care Management (Signed)
Pre-Op question/call from patient intake regarding "home health". Patient is scheduled hand surgery in the future and concerned about her husband not being able to care for her. I advised Magda Paganini in patient intake to share with patient to talk to Dr. Roland Rack to see if home health PT/RN is medically necessary. Apparently patient has talked to Dr. Roland Rack about "being admitted to hospital and home health". It sounds like patient is needing personal care/assistance and this RNCM advised that patient seek family members to assist her with meal planning and daily activities as this service is not covered under her insurance. I explained that [facility] has [place] providers which is a personal pay home health care service that she could check into also if necessary. Dr. Roland Rack updated on patient request via voice mail to clinic.

## 2016-11-14 ENCOUNTER — Observation Stay
Admission: AD | Admit: 2016-11-14 | Discharge: 2016-11-15 | Disposition: A | Discharge status: Home / Self Care | Payer: Medicare Other | Source: Ambulatory Visit | Attending: Surgery | Admitting: Surgery

## 2016-11-14 ENCOUNTER — Ambulatory Visit: Payer: Medicare Other

## 2016-11-14 ENCOUNTER — Ambulatory Visit: Payer: Medicare Other | Admitting: Anesthesiology

## 2016-11-14 ENCOUNTER — Encounter: Payer: Self-pay | Admitting: Anesthesiology

## 2016-11-14 ENCOUNTER — Encounter
Admission: AD | Disposition: A | Discharge status: Home / Self Care | Payer: Self-pay | Source: Ambulatory Visit | Attending: Surgery

## 2016-11-14 DIAGNOSIS — Z90710 Acquired absence of both cervix and uterus: Secondary | ICD-10-CM | POA: Insufficient documentation

## 2016-11-14 DIAGNOSIS — Z79899 Other long term (current) drug therapy: Secondary | ICD-10-CM | POA: Insufficient documentation

## 2016-11-14 DIAGNOSIS — K219 Gastro-esophageal reflux disease without esophagitis: Secondary | ICD-10-CM | POA: Insufficient documentation

## 2016-11-14 DIAGNOSIS — Z6841 Body Mass Index (BMI) 40.0 and over, adult: Secondary | ICD-10-CM | POA: Insufficient documentation

## 2016-11-14 DIAGNOSIS — M6281 Muscle weakness (generalized): Secondary | ICD-10-CM | POA: Insufficient documentation

## 2016-11-14 DIAGNOSIS — Z87891 Personal history of nicotine dependence: Secondary | ICD-10-CM | POA: Insufficient documentation

## 2016-11-14 DIAGNOSIS — Z888 Allergy status to other drugs, medicaments and biological substances status: Secondary | ICD-10-CM | POA: Insufficient documentation

## 2016-11-14 DIAGNOSIS — M1990 Unspecified osteoarthritis, unspecified site: Secondary | ICD-10-CM | POA: Insufficient documentation

## 2016-11-14 DIAGNOSIS — E039 Hypothyroidism, unspecified: Secondary | ICD-10-CM | POA: Insufficient documentation

## 2016-11-14 DIAGNOSIS — S42222K 2-part displaced fracture of surgical neck of left humerus, subsequent encounter for fracture with nonunion: Secondary | ICD-10-CM | POA: Diagnosis not present

## 2016-11-14 DIAGNOSIS — E785 Hyperlipidemia, unspecified: Secondary | ICD-10-CM | POA: Insufficient documentation

## 2016-11-14 DIAGNOSIS — Z885 Allergy status to narcotic agent status: Secondary | ICD-10-CM | POA: Insufficient documentation

## 2016-11-14 DIAGNOSIS — Z79891 Long term (current) use of opiate analgesic: Secondary | ICD-10-CM | POA: Insufficient documentation

## 2016-11-14 DIAGNOSIS — Z91010 Allergy to peanuts: Secondary | ICD-10-CM | POA: Insufficient documentation

## 2016-11-14 DIAGNOSIS — Z882 Allergy status to sulfonamides status: Secondary | ICD-10-CM | POA: Insufficient documentation

## 2016-11-14 DIAGNOSIS — Z7951 Long term (current) use of inhaled steroids: Secondary | ICD-10-CM | POA: Insufficient documentation

## 2016-11-14 DIAGNOSIS — Z791 Long term (current) use of non-steroidal anti-inflammatories (NSAID): Secondary | ICD-10-CM | POA: Insufficient documentation

## 2016-11-14 DIAGNOSIS — F419 Anxiety disorder, unspecified: Secondary | ICD-10-CM | POA: Insufficient documentation

## 2016-11-14 DIAGNOSIS — E669 Obesity, unspecified: Secondary | ICD-10-CM | POA: Insufficient documentation

## 2016-11-14 DIAGNOSIS — J449 Chronic obstructive pulmonary disease, unspecified: Secondary | ICD-10-CM | POA: Insufficient documentation

## 2016-11-14 DIAGNOSIS — I2510 Atherosclerotic heart disease of native coronary artery without angina pectoris: Secondary | ICD-10-CM | POA: Insufficient documentation

## 2016-11-14 DIAGNOSIS — Z8673 Personal history of transient ischemic attack (TIA), and cerebral infarction without residual deficits: Secondary | ICD-10-CM | POA: Insufficient documentation

## 2016-11-14 DIAGNOSIS — Z419 Encounter for procedure for purposes other than remedying health state, unspecified: Secondary | ICD-10-CM

## 2016-11-14 DIAGNOSIS — X58XXXD Exposure to other specified factors, subsequent encounter: Secondary | ICD-10-CM | POA: Insufficient documentation

## 2016-11-14 DIAGNOSIS — R262 Difficulty in walking, not elsewhere classified: Secondary | ICD-10-CM | POA: Insufficient documentation

## 2016-11-14 DIAGNOSIS — Z91040 Latex allergy status: Secondary | ICD-10-CM | POA: Insufficient documentation

## 2016-11-14 DIAGNOSIS — Z91041 Radiographic dye allergy status: Secondary | ICD-10-CM | POA: Insufficient documentation

## 2016-11-14 DIAGNOSIS — I351 Nonrheumatic aortic (valve) insufficiency: Secondary | ICD-10-CM | POA: Insufficient documentation

## 2016-11-14 DIAGNOSIS — S42209A Unspecified fracture of upper end of unspecified humerus, initial encounter for closed fracture: Secondary | ICD-10-CM | POA: Diagnosis present

## 2016-11-14 DIAGNOSIS — Z7982 Long term (current) use of aspirin: Secondary | ICD-10-CM | POA: Insufficient documentation

## 2016-11-14 DIAGNOSIS — M797 Fibromyalgia: Secondary | ICD-10-CM | POA: Insufficient documentation

## 2016-11-14 DIAGNOSIS — I10 Essential (primary) hypertension: Secondary | ICD-10-CM | POA: Insufficient documentation

## 2016-11-14 HISTORY — PX: ORIF HUMERUS FRACTURE: SHX2126

## 2016-11-14 HISTORY — PX: HARDWARE REMOVAL: SHX979

## 2016-11-14 LAB — CREATININE, SERUM
Creatinine, Ser: 1.47 mg/dL — ABNORMAL HIGH (ref 0.44–1.00)
GFR calc non Af Amer: 35 mL/min — ABNORMAL LOW (ref >60)
GFR, EST AFRICAN AMERICAN: 41 mL/min — AB (ref >60)

## 2016-11-14 LAB — TYPE AND SCREEN
ABO/RH(D): O POS
Antibody Screen: NEGATIVE

## 2016-11-14 SURGERY — REMOVAL, HARDWARE
Anesthesia: General | Site: Shoulder | Laterality: Left | Wound class: Clean

## 2016-11-14 MED ORDER — LIDOCAINE HCL (PF) 2 % IJ SOLN
INTRAMUSCULAR | Status: AC
  Filled 2016-11-14: qty 2

## 2016-11-14 MED ORDER — FENTANYL CITRATE (PF) 100 MCG/2ML IJ SOLN
INTRAMUSCULAR | Status: AC
  Filled 2016-11-14: qty 2

## 2016-11-14 MED ORDER — BUPIVACAINE-EPINEPHRINE (PF) 0.5% -1:200000 IJ SOLN
INTRAMUSCULAR | Status: AC
  Filled 2016-11-14: qty 30

## 2016-11-14 MED ORDER — FLEET ENEMA 7-19 GM/118ML RE ENEM: 1.0000 | ENEMA | Freq: Once | RECTAL | Status: DC | PRN

## 2016-11-14 MED ORDER — DEXAMETHASONE SODIUM PHOSPHATE 10 MG/ML IJ SOLN
INTRAMUSCULAR | Status: DC | PRN
Start: 2016-11-14 — End: 2016-11-14
  Administered 2016-11-14: 10 mg via INTRAVENOUS

## 2016-11-14 MED ORDER — CLINDAMYCIN PHOSPHATE 900 MG/50ML IV SOLN
900.0000 mg | Freq: Four times a day (QID) | INTRAVENOUS | Status: AC
  Administered 2016-11-14 – 2016-11-15 (×3): 900 mg via INTRAVENOUS
  Filled 2016-11-14 (×3): qty 50

## 2016-11-14 MED ORDER — NEOMYCIN-POLYMYXIN B GU 40-200000 IR SOLN
Status: AC
  Filled 2016-11-14: qty 20

## 2016-11-14 MED ORDER — ACETAMINOPHEN 650 MG RE SUPP: 650.0000 mg | Freq: Four times a day (QID) | RECTAL | Status: DC | PRN

## 2016-11-14 MED ORDER — CLINDAMYCIN PHOSPHATE 900 MG/50ML IV SOLN
INTRAVENOUS | Status: AC
  Administered 2016-11-14: 900 mg via INTRAVENOUS
  Filled 2016-11-14: qty 50

## 2016-11-14 MED ORDER — DOCUSATE SODIUM 100 MG PO CAPS
100.0000 mg | ORAL_CAPSULE | Freq: Two times a day (BID) | ORAL | Status: DC
  Administered 2016-11-15: 100 mg via ORAL
  Filled 2016-11-14 (×2): qty 1

## 2016-11-14 MED ORDER — FENTANYL CITRATE (PF) 100 MCG/2ML IJ SOLN
INTRAMUSCULAR | Status: AC
  Administered 2016-11-14: 25 ug via INTRAVENOUS
  Filled 2016-11-14: qty 2

## 2016-11-14 MED ORDER — HYDROXYZINE HCL 25 MG PO TABS
25.0000 mg | ORAL_TABLET | Freq: Four times a day (QID) | ORAL | Status: DC | PRN
  Administered 2016-11-14: 25 mg via ORAL
  Filled 2016-11-14 (×2): qty 1

## 2016-11-14 MED ORDER — ALBUTEROL SULFATE (2.5 MG/3ML) 0.083% IN NEBU: 2.5000 mg | INHALATION_SOLUTION | Freq: Four times a day (QID) | RESPIRATORY_TRACT | Status: DC | PRN

## 2016-11-14 MED ORDER — METOCLOPRAMIDE HCL 10 MG PO TABS: 5.0000 mg | ORAL_TABLET | Freq: Three times a day (TID) | ORAL | Status: DC | PRN

## 2016-11-14 MED ORDER — ENOXAPARIN SODIUM 40 MG/0.4ML ~~LOC~~ SOLN
40.0000 mg | Freq: Two times a day (BID) | SUBCUTANEOUS | Status: DC
  Administered 2016-11-15: 40 mg via SUBCUTANEOUS
  Filled 2016-11-14: qty 0.4

## 2016-11-14 MED ORDER — FAMOTIDINE 20 MG PO TABS
20.0000 mg | ORAL_TABLET | Freq: Two times a day (BID) | ORAL | Status: DC
  Administered 2016-11-14 – 2016-11-15 (×2): 20 mg via ORAL
  Filled 2016-11-14 (×2): qty 1

## 2016-11-14 MED ORDER — VITAMIN B-6 50 MG PO TABS
100.0000 mg | ORAL_TABLET | Freq: Every day | ORAL | Status: DC
  Administered 2016-11-14 – 2016-11-15 (×2): 100 mg via ORAL
  Filled 2016-11-14 (×2): qty 2

## 2016-11-14 MED ORDER — HYDROMORPHONE HCL 1 MG/ML IJ SOLN: 1.0000 mg | INTRAMUSCULAR | Status: DC | PRN

## 2016-11-14 MED ORDER — KETOROLAC TROMETHAMINE 30 MG/ML IJ SOLN
INTRAMUSCULAR | Status: AC
  Filled 2016-11-14: qty 1

## 2016-11-14 MED ORDER — ONDANSETRON HCL 4 MG/2ML IJ SOLN: 4.0000 mg | Freq: Once | INTRAMUSCULAR | Status: DC | PRN

## 2016-11-14 MED ORDER — ACETAMINOPHEN 500 MG PO TABS
1000.0000 mg | ORAL_TABLET | Freq: Four times a day (QID) | ORAL | Status: DC
  Administered 2016-11-14 – 2016-11-15 (×3): 1000 mg via ORAL
  Filled 2016-11-14 (×3): qty 2

## 2016-11-14 MED ORDER — ROCURONIUM BROMIDE 50 MG/5ML IV SOLN
INTRAVENOUS | Status: AC
  Filled 2016-11-14: qty 1

## 2016-11-14 MED ORDER — MIDAZOLAM HCL 2 MG/2ML IJ SOLN
INTRAMUSCULAR | Status: AC
Start: 2016-11-14 — End: 2016-11-14
  Filled 2016-11-14: qty 2

## 2016-11-14 MED ORDER — PROPOFOL 10 MG/ML IV BOLUS
INTRAVENOUS | Status: AC
  Filled 2016-11-14: qty 20

## 2016-11-14 MED ORDER — SCOPOLAMINE 1 MG/3DAYS TD PT72
MEDICATED_PATCH | TRANSDERMAL | Status: AC
  Administered 2016-11-14: 1.5 mg via TRANSDERMAL
  Filled 2016-11-14: qty 1

## 2016-11-14 MED ORDER — IPRATROPIUM-ALBUTEROL 0.5-2.5 (3) MG/3ML IN SOLN
RESPIRATORY_TRACT | Status: AC
  Administered 2016-11-14: 3 mL via RESPIRATORY_TRACT
  Filled 2016-11-14: qty 3

## 2016-11-14 MED ORDER — KETOROLAC TROMETHAMINE 15 MG/ML IJ SOLN
15.0000 mg | Freq: Once | INTRAMUSCULAR | Status: AC
  Administered 2016-11-14: 15 mg via INTRAVENOUS
  Filled 2016-11-14: qty 1

## 2016-11-14 MED ORDER — OXYCODONE HCL 5 MG PO TABS
5.0000 mg | ORAL_TABLET | ORAL | Status: DC | PRN
  Administered 2016-11-14 (×2): 5 mg via ORAL
  Filled 2016-11-14 (×2): qty 1

## 2016-11-14 MED ORDER — SCOPOLAMINE 1 MG/3DAYS TD PT72
1.0000 | MEDICATED_PATCH | TRANSDERMAL | Status: DC
  Administered 2016-11-14: 1.5 mg via TRANSDERMAL

## 2016-11-14 MED ORDER — METHYLPREDNISOLONE SODIUM SUCC 125 MG IJ SOLR
INTRAMUSCULAR | Status: DC | PRN
  Administered 2016-11-14: 125 mg via INTRAVENOUS

## 2016-11-14 MED ORDER — MOMETASONE FURO-FORMOTEROL FUM 200-5 MCG/ACT IN AERO
2.0000 | INHALATION_SPRAY | Freq: Two times a day (BID) | RESPIRATORY_TRACT | Status: DC
  Administered 2016-11-15: 2 via RESPIRATORY_TRACT
  Filled 2016-11-14: qty 8.8

## 2016-11-14 MED ORDER — METOCLOPRAMIDE HCL 5 MG/ML IJ SOLN: 5.0000 mg | Freq: Three times a day (TID) | INTRAMUSCULAR | Status: DC | PRN

## 2016-11-14 MED ORDER — SUGAMMADEX SODIUM 500 MG/5ML IV SOLN
INTRAVENOUS | Status: DC | PRN
Start: 2016-11-14 — End: 2016-11-14
  Administered 2016-11-14: 200 mg via INTRAVENOUS

## 2016-11-14 MED ORDER — ROCURONIUM BROMIDE 100 MG/10ML IV SOLN
INTRAVENOUS | Status: DC | PRN
  Administered 2016-11-14: 20 mg via INTRAVENOUS
  Administered 2016-11-14: 10 mg via INTRAVENOUS
  Administered 2016-11-14: 40 mg via INTRAVENOUS
  Administered 2016-11-14: 5 mg via INTRAVENOUS
  Administered 2016-11-14 (×2): 10 mg via INTRAVENOUS

## 2016-11-14 MED ORDER — MAGNESIUM HYDROXIDE 400 MG/5ML PO SUSP: 30.0000 mL | Freq: Every day | ORAL | Status: DC | PRN

## 2016-11-14 MED ORDER — CLINDAMYCIN PHOSPHATE 900 MG/50ML IV SOLN
900.0000 mg | Freq: Once | INTRAVENOUS | Status: AC
  Administered 2016-11-14: 900 mg via INTRAVENOUS

## 2016-11-14 MED ORDER — ASPIRIN EC 81 MG PO TBEC
81.0000 mg | DELAYED_RELEASE_TABLET | Freq: Every day | ORAL | Status: DC
  Administered 2016-11-14: 81 mg via ORAL
  Filled 2016-11-14: qty 1

## 2016-11-14 MED ORDER — MIDAZOLAM HCL 2 MG/2ML IJ SOLN
INTRAMUSCULAR | Status: DC | PRN
  Administered 2016-11-14: 2 mg via INTRAVENOUS

## 2016-11-14 MED ORDER — PROPOFOL 10 MG/ML IV BOLUS
INTRAVENOUS | Status: DC | PRN
  Administered 2016-11-14: 150 mg via INTRAVENOUS

## 2016-11-14 MED ORDER — LIDOCAINE HCL (CARDIAC) 20 MG/ML IV SOLN
INTRAVENOUS | Status: DC | PRN
  Administered 2016-11-14: 80 mg via INTRAVENOUS

## 2016-11-14 MED ORDER — ONDANSETRON HCL 4 MG/2ML IJ SOLN
INTRAMUSCULAR | Status: AC
  Filled 2016-11-14: qty 2

## 2016-11-14 MED ORDER — VITAMIN D 1000 UNITS PO TABS
2000.0000 [IU] | ORAL_TABLET | Freq: Every day | ORAL | Status: DC
  Administered 2016-11-15: 2000 [IU] via ORAL
  Filled 2016-11-14: qty 2

## 2016-11-14 MED ORDER — DIPHENOXYLATE-ATROPINE 2.5-0.025 MG PO TABS: 1.0000 | ORAL_TABLET | Freq: Four times a day (QID) | ORAL | Status: DC | PRN

## 2016-11-14 MED ORDER — FOLIC ACID 1 MG PO TABS
1.0000 mg | ORAL_TABLET | Freq: Every day | ORAL | Status: DC
  Administered 2016-11-14 – 2016-11-15 (×2): 1 mg via ORAL
  Filled 2016-11-14 (×2): qty 1

## 2016-11-14 MED ORDER — ONDANSETRON HCL 4 MG/2ML IJ SOLN: 4.0000 mg | Freq: Four times a day (QID) | INTRAMUSCULAR | Status: DC | PRN

## 2016-11-14 MED ORDER — HYDROMORPHONE HCL 1 MG/ML IJ SOLN: 0.2500 mg | INTRAMUSCULAR | Status: DC | PRN

## 2016-11-14 MED ORDER — BUTALBITAL-APAP-CAFFEINE 50-325-40 MG PO TABS
1.0000 | ORAL_TABLET | ORAL | Status: DC | PRN
  Filled 2016-11-14: qty 1

## 2016-11-14 MED ORDER — RISAQUAD PO CAPS: 1.0000 | ORAL_CAPSULE | Freq: Every day | ORAL | Status: DC

## 2016-11-14 MED ORDER — RISAQUAD PO CAPS
1.0000 | ORAL_CAPSULE | Freq: Every day | ORAL | Status: DC
  Administered 2016-11-14 – 2016-11-15 (×2): 1 via ORAL
  Filled 2016-11-14 (×2): qty 1

## 2016-11-14 MED ORDER — IPRATROPIUM-ALBUTEROL 0.5-2.5 (3) MG/3ML IN SOLN
3.0000 mL | Freq: Four times a day (QID) | RESPIRATORY_TRACT | Status: DC
  Administered 2016-11-14: 3 mL via RESPIRATORY_TRACT

## 2016-11-14 MED ORDER — FENTANYL 25 MCG/HR TD PT72
25.0000 ug | MEDICATED_PATCH | TRANSDERMAL | Status: DC
  Administered 2016-11-14: 25 ug via TRANSDERMAL
  Filled 2016-11-14: qty 1

## 2016-11-14 MED ORDER — SIMVASTATIN 40 MG PO TABS
40.0000 mg | ORAL_TABLET | Freq: Every day | ORAL | Status: DC
  Administered 2016-11-14: 40 mg via ORAL
  Filled 2016-11-14: qty 1

## 2016-11-14 MED ORDER — LORAZEPAM 2 MG PO TABS
4.0000 mg | ORAL_TABLET | Freq: Every day | ORAL | Status: DC
  Administered 2016-11-14: 4 mg via ORAL
  Filled 2016-11-14: qty 2

## 2016-11-14 MED ORDER — NYSTATIN 100000 UNIT/GM EX POWD
Freq: Four times a day (QID) | CUTANEOUS | Status: DC
  Administered 2016-11-14 (×2): via TOPICAL
  Filled 2016-11-14: qty 15

## 2016-11-14 MED ORDER — SUCCINYLCHOLINE CHLORIDE 20 MG/ML IJ SOLN
INTRAMUSCULAR | Status: DC | PRN
  Administered 2016-11-14: 120 mg via INTRAVENOUS

## 2016-11-14 MED ORDER — ALBUTEROL SULFATE HFA 108 (90 BASE) MCG/ACT IN AERS
INHALATION_SPRAY | RESPIRATORY_TRACT | Status: DC | PRN
  Administered 2016-11-14: 2 via RESPIRATORY_TRACT

## 2016-11-14 MED ORDER — KETOROLAC TROMETHAMINE 15 MG/ML IJ SOLN
7.5000 mg | Freq: Four times a day (QID) | INTRAMUSCULAR | Status: DC
  Administered 2016-11-14 – 2016-11-15 (×2): 7.5 mg via INTRAVENOUS
  Filled 2016-11-14 (×2): qty 1

## 2016-11-14 MED ORDER — MAGNESIUM OXIDE 400 (241.3 MG) MG PO TABS
400.0000 mg | ORAL_TABLET | Freq: Every day | ORAL | Status: DC
  Administered 2016-11-14: 400 mg via ORAL
  Filled 2016-11-14: qty 1

## 2016-11-14 MED ORDER — ONDANSETRON HCL 4 MG PO TABS: 4.0000 mg | ORAL_TABLET | Freq: Four times a day (QID) | ORAL | Status: DC | PRN

## 2016-11-14 MED ORDER — RALOXIFENE HCL 60 MG PO TABS
60.0000 mg | ORAL_TABLET | Freq: Every day | ORAL | Status: DC
  Administered 2016-11-14 – 2016-11-15 (×2): 60 mg via ORAL
  Filled 2016-11-14 (×2): qty 1

## 2016-11-14 MED ORDER — AMLODIPINE BESYLATE 5 MG PO TABS
5.0000 mg | ORAL_TABLET | Freq: Every day | ORAL | Status: DC
  Filled 2016-11-14: qty 1

## 2016-11-14 MED ORDER — PROMETHAZINE HCL 25 MG PO TABS: 25.0000 mg | ORAL_TABLET | Freq: Four times a day (QID) | ORAL | Status: DC | PRN

## 2016-11-14 MED ORDER — ALBUTEROL SULFATE HFA 108 (90 BASE) MCG/ACT IN AERS
INHALATION_SPRAY | RESPIRATORY_TRACT | Status: AC
  Filled 2016-11-14: qty 6.7

## 2016-11-14 MED ORDER — ACETAMINOPHEN 325 MG PO TABS: 650.0000 mg | ORAL_TABLET | Freq: Four times a day (QID) | ORAL | Status: DC | PRN

## 2016-11-14 MED ORDER — PROPRANOLOL HCL ER 60 MG PO CP24
60.0000 mg | ORAL_CAPSULE | Freq: Every day | ORAL | Status: DC
  Filled 2016-11-14: qty 1

## 2016-11-14 MED ORDER — KCL IN DEXTROSE-NACL 20-5-0.9 MEQ/L-%-% IV SOLN
INTRAVENOUS | Status: DC
  Administered 2016-11-14: 18:00:00 via INTRAVENOUS
  Filled 2016-11-14 (×3): qty 1000

## 2016-11-14 MED ORDER — SODIUM CHLORIDE 0.9 % IV SOLN
INTRAVENOUS | Status: DC | PRN
  Administered 2016-11-14: 20 ug/min via INTRAVENOUS

## 2016-11-14 MED ORDER — FUROSEMIDE 20 MG PO TABS: 20.0000 mg | ORAL_TABLET | Freq: Every day | ORAL | Status: DC | PRN

## 2016-11-14 MED ORDER — FENTANYL CITRATE (PF) 100 MCG/2ML IJ SOLN
25.0000 ug | INTRAMUSCULAR | Status: DC | PRN
Start: 2016-11-14 — End: 2016-11-14
  Administered 2016-11-14 (×2): 25 ug via INTRAVENOUS

## 2016-11-14 MED ORDER — SODIUM CHLORIDE FLUSH 0.9 % IV SOLN
INTRAVENOUS | Status: AC
  Administered 2016-11-14: 10 mL
  Filled 2016-11-14: qty 10

## 2016-11-14 MED ORDER — ONDANSETRON HCL 4 MG/2ML IJ SOLN
INTRAMUSCULAR | Status: DC | PRN
  Administered 2016-11-14: 4 mg via INTRAVENOUS

## 2016-11-14 MED ORDER — VITAMIN D (ERGOCALCIFEROL) 1.25 MG (50000 UNIT) PO CAPS: 50000.0000 [IU] | ORAL_CAPSULE | ORAL | Status: DC

## 2016-11-14 MED ORDER — VITAMIN B-2 100 MG PO TABS: 400.0000 mg | ORAL_TABLET | Freq: Every day | ORAL | Status: DC

## 2016-11-14 MED ORDER — VITAMIN B-12 1000 MCG PO TABS: 500.0000 ug | ORAL_TABLET | ORAL | Status: DC

## 2016-11-14 MED ORDER — EVENING PRIMROSE OIL 1000 MG PO CAPS: 1300.0000 mg | ORAL_CAPSULE | Freq: Every day | ORAL | Status: DC

## 2016-11-14 MED ORDER — NON FORMULARY: 30.0000 mg | Freq: Two times a day (BID) | Status: DC

## 2016-11-14 MED ORDER — DIPHENHYDRAMINE HCL 12.5 MG/5ML PO ELIX
12.5000 mg | ORAL_SOLUTION | ORAL | Status: DC | PRN
  Administered 2016-11-14: 25 mg via ORAL
  Filled 2016-11-14: qty 10

## 2016-11-14 MED ORDER — CITALOPRAM HYDROBROMIDE 20 MG PO TABS
20.0000 mg | ORAL_TABLET | Freq: Two times a day (BID) | ORAL | Status: DC
  Administered 2016-11-14 – 2016-11-15 (×2): 20 mg via ORAL
  Filled 2016-11-14 (×3): qty 1

## 2016-11-14 MED ORDER — BIOTIN 5000 MCG PO CAPS: 5000.0000 ug | ORAL_CAPSULE | Freq: Every day | ORAL | Status: DC

## 2016-11-14 MED ORDER — METAXALONE 400 MG HALF TABLET
400.0000 mg | ORAL_TABLET | Freq: Three times a day (TID) | ORAL | Status: DC
  Administered 2016-11-14 – 2016-11-15 (×3): 400 mg via ORAL
  Filled 2016-11-14 (×4): qty 1

## 2016-11-14 MED ORDER — BISACODYL 10 MG RE SUPP: 10.0000 mg | Freq: Every day | RECTAL | Status: DC | PRN

## 2016-11-14 MED ORDER — ACETAMINOPHEN 10 MG/ML IV SOLN
INTRAVENOUS | Status: DC | PRN
  Administered 2016-11-14: 1000 mg via INTRAVENOUS

## 2016-11-14 MED ORDER — LISINOPRIL 20 MG PO TABS
20.0000 mg | ORAL_TABLET | Freq: Two times a day (BID) | ORAL | Status: DC
  Administered 2016-11-15: 20 mg via ORAL
  Filled 2016-11-14 (×2): qty 1

## 2016-11-14 MED ORDER — ACETAMINOPHEN 10 MG/ML IV SOLN
INTRAVENOUS | Status: AC
  Filled 2016-11-14: qty 100

## 2016-11-14 MED ORDER — LACTATED RINGERS IV SOLN
INTRAVENOUS | Status: DC
  Administered 2016-11-14: 10:00:00 via INTRAVENOUS

## 2016-11-14 MED ORDER — FENTANYL CITRATE (PF) 100 MCG/2ML IJ SOLN
INTRAMUSCULAR | Status: DC | PRN
  Administered 2016-11-14: 50 ug via INTRAVENOUS
  Administered 2016-11-14: 25 ug via INTRAVENOUS
  Administered 2016-11-14: 50 ug via INTRAVENOUS

## 2016-11-14 MED ORDER — BUPIVACAINE HCL (PF) 0.5 % IJ SOLN
INTRAMUSCULAR | Status: AC
  Filled 2016-11-14: qty 30

## 2016-11-14 MED ORDER — FENTANYL CITRATE (PF) 100 MCG/2ML IJ SOLN
25.0000 ug | INTRAMUSCULAR | Status: AC | PRN
  Administered 2016-11-14 (×6): 25 ug via INTRAVENOUS

## 2016-11-14 MED ORDER — MELATONIN 5 MG PO TABS
1.0000 | ORAL_TABLET | Freq: Every day | ORAL | Status: DC
  Administered 2016-11-14: 5 mg via ORAL
  Filled 2016-11-14: qty 1

## 2016-11-14 MED ORDER — SUGAMMADEX SODIUM 200 MG/2ML IV SOLN
INTRAVENOUS | Status: AC
  Filled 2016-11-14: qty 2

## 2016-11-14 SURGICAL SUPPLY — 75 items
BANDAGE ACE 4X5 VEL STRL LF (GAUZE/BANDAGES/DRESSINGS) ×6 IMPLANT
BIT DRILL CALIBRATED 2.7 (BIT) ×2 IMPLANT
BIT DRILL CALIBRATED 2.7MM (BIT) ×1
BNDG COHESIVE 4X5 TAN STRL (GAUZE/BANDAGES/DRESSINGS) ×3 IMPLANT
BONE CHIP PRESERV 40CC PCAN1/2 (Bone Implant) ×3 IMPLANT
CANISTER SUCT 1200ML W/VALVE (MISCELLANEOUS) ×3 IMPLANT
CHLORAPREP W/TINT 26ML (MISCELLANEOUS) ×6 IMPLANT
CNTNR SPEC 2.5X3XGRAD LEK (MISCELLANEOUS) ×2
CONT SPEC 4OZ STER OR WHT (MISCELLANEOUS) ×4
CONTAINER SPEC 2.5X3XGRAD LEK (MISCELLANEOUS) ×2 IMPLANT
COOLER POLAR GLACIER W/PUMP (MISCELLANEOUS) ×3 IMPLANT
CUFF TOURN 18 STER (MISCELLANEOUS) IMPLANT
CUFF TOURN 24 STER (MISCELLANEOUS) IMPLANT
DRAPE C-ARM XRAY 36X54 (DRAPES) ×3 IMPLANT
DRAPE FLUOR MINI C-ARM 54X84 (DRAPES) ×3 IMPLANT
DRAPE INCISE IOBAN 66X45 STRL (DRAPES) ×9 IMPLANT
DRAPE U-SHAPE 47X51 STRL (DRAPES) ×3 IMPLANT
DRSG OPSITE POSTOP 4X10 (GAUZE/BANDAGES/DRESSINGS) IMPLANT
DRSG OPSITE POSTOP 4X8 (GAUZE/BANDAGES/DRESSINGS) ×3 IMPLANT
ELECT CAUTERY BLADE 6.4 (BLADE) ×3 IMPLANT
ELECT REM PT RETURN 9FT ADLT (ELECTROSURGICAL) ×3
ELECTRODE REM PT RTRN 9FT ADLT (ELECTROSURGICAL) ×1 IMPLANT
GAUZE PETRO XEROFOAM 1X8 (MISCELLANEOUS) ×3 IMPLANT
GAUZE SPONGE 4X4 12PLY STRL (GAUZE/BANDAGES/DRESSINGS) ×3 IMPLANT
GLOVE BIO SURGEON STRL SZ7.5 (GLOVE) ×6 IMPLANT
GLOVE BIO SURGEON STRL SZ8 (GLOVE) ×6 IMPLANT
GLOVE BIOGEL PI IND STRL 8 (GLOVE) ×1 IMPLANT
GLOVE BIOGEL PI INDICATOR 8 (GLOVE) ×2
GLOVE INDICATOR 8.0 STRL GRN (GLOVE) ×3 IMPLANT
GOWN STRL REUS W/ TWL LRG LVL3 (GOWN DISPOSABLE) ×2 IMPLANT
GOWN STRL REUS W/ TWL XL LVL3 (GOWN DISPOSABLE) ×1 IMPLANT
GOWN STRL REUS W/TWL LRG LVL3 (GOWN DISPOSABLE) ×4
GOWN STRL REUS W/TWL XL LVL3 (GOWN DISPOSABLE) ×2
HANDLE YANKAUER SUCT BULB TIP (MISCELLANEOUS) ×3 IMPLANT
K-WIRE 2X5 SS THRDED S3 (WIRE) ×3
KIT RM TURNOVER STRD PROC AR (KITS) ×3 IMPLANT
KIT STABILIZATION SHOULDER (MISCELLANEOUS) ×3 IMPLANT
KWIRE 2X5 SS THRDED S3 (WIRE) ×1 IMPLANT
MASK FACE SPIDER DISP (MASK) ×3 IMPLANT
NEEDLE FILTER BLUNT 18X 1/2SAF (NEEDLE) ×2
NEEDLE FILTER BLUNT 18X1 1/2 (NEEDLE) ×1 IMPLANT
NEEDLE HYPO 25X1 1.5 SAFETY (NEEDLE) ×3 IMPLANT
NEEDLE MAYO 6 CRC TAPER PT (NEEDLE) IMPLANT
NS IRRIG 1000ML POUR BTL (IV SOLUTION) ×3 IMPLANT
PACK ARTHROSCOPY SHOULDER (MISCELLANEOUS) ×3 IMPLANT
PACK EXTREMITY ARMC (MISCELLANEOUS) ×3 IMPLANT
PAD CAST CTTN 4X4 STRL (SOFTGOODS) ×2 IMPLANT
PAD WRAPON POLAR SHDR UNIV (MISCELLANEOUS) ×1 IMPLANT
PADDING CAST COTTON 4X4 STRL (SOFTGOODS) ×4
PULSAVAC PLUS IRRIG FAN TIP (DISPOSABLE) ×3
PUTTY DBX 1CC (Putty) ×6 IMPLANT
PUTTY DBX 1CC DEPUY (Putty) ×2 IMPLANT
SCREW LOCK CORT STAR 3.5X24 (Screw) ×3 IMPLANT
SCREW LOCK CORT STAR 3.5X52 (Screw) ×3 IMPLANT
SCREW LOW PROFILE 3.5X30MM TIS (Screw) ×3 IMPLANT
SCREW LP NL T15 3.5X26 (Screw) ×3 IMPLANT
SLING ULTRA II LG (MISCELLANEOUS) IMPLANT
SLING ULTRA II M (MISCELLANEOUS) ×3 IMPLANT
STAPLER SKIN PROX 35W (STAPLE) ×3 IMPLANT
STOCKINETTE IMPERVIOUS 9X36 MD (GAUZE/BANDAGES/DRESSINGS) ×3 IMPLANT
STOCKINETTE M/LG 89821 (MISCELLANEOUS) ×3 IMPLANT
SUT ETHIBOND 0 MO6 C/R (SUTURE) ×3 IMPLANT
SUT FIBERWIRE #2 38 BLUE 1/2 (SUTURE) ×12
SUT FIBERWIRE #2 38 T-5 BLUE (SUTURE) ×9
SUT PROLENE 4 0 PS 2 18 (SUTURE) ×6 IMPLANT
SUT VIC AB 2-0 CT1 27 (SUTURE) ×8
SUT VIC AB 2-0 CT1 TAPERPNT 27 (SUTURE) ×4 IMPLANT
SUT VIC AB 2-0 SH 27 (SUTURE) ×4
SUT VIC AB 2-0 SH 27XBRD (SUTURE) ×2 IMPLANT
SUTURE FIBERWR #2 38 BLUE 1/2 (SUTURE) ×4 IMPLANT
SUTURE FIBERWR #2 38 T-5 BLUE (SUTURE) ×3 IMPLANT
SWAB DUAL CULTURE TRANS RED ST (MISCELLANEOUS) ×3 IMPLANT
SYRINGE 10CC LL (SYRINGE) ×3 IMPLANT
TIP FAN IRRIG PULSAVAC PLUS (DISPOSABLE) ×1 IMPLANT
WRAPON POLAR PAD SHDR UNIV (MISCELLANEOUS) ×3

## 2016-11-14 NOTE — Progress Notes (Signed)
Patient accepted into room 138 post procedure.  Patient oriented to room.  Call bell placed within reach.

## 2016-11-14 NOTE — Anesthesia Procedure Notes (Signed)
Procedure Name: Intubation Date/Time: 11/14/2016 11:14 AM Performed by: Hedda Slade Pre-anesthesia Checklist: Patient identified, Patient being monitored, Timeout performed, Emergency Drugs available and Suction available Patient Re-evaluated:Patient Re-evaluated prior to inductionOxygen Delivery Method: Circle system utilized Preoxygenation: Pre-oxygenation with 100% oxygen Intubation Type: IV induction Ventilation: Mask ventilation without difficulty and Oral airway inserted - appropriate to patient size Laryngoscope Size: Mac and 3 Grade View: Grade I Tube type: Oral Tube size: 7.0 mm Number of attempts: 1 Airway Equipment and Method: Stylet Placement Confirmation: ETT inserted through vocal cords under direct vision,  positive ETCO2 and breath sounds checked- equal and bilateral Secured at: 21 cm Tube secured with: Tape Dental Injury: Teeth and Oropharynx as per pre-operative assessment

## 2016-11-14 NOTE — Anesthesia Preprocedure Evaluation (Addendum)
Anesthesia Evaluation  Patient identified by MRN, date of birth, ID band Patient awake    Reviewed: Allergy & Precautions, NPO status , Patient's Chart, lab work & pertinent test results, reviewed documented beta blocker date and time   History of Anesthesia Complications (+) PONV, POST - OP SPINAL HEADACHE, Family history of anesthesia reaction and history of anesthetic complications  Airway Mallampati: II  TM Distance: >3 FB     Dental  (+) Chipped   Pulmonary asthma , COPD,  COPD inhaler, former smoker,    Pulmonary exam normal        Cardiovascular hypertension, Pt. on medications and Pt. on home beta blockers + CAD  Normal cardiovascular exam     Neuro/Psych  Headaches, PSYCHIATRIC DISORDERS Anxiety Depression  Neuromuscular disease CVA    GI/Hepatic Neg liver ROS, GERD  ,  Endo/Other  Hypothyroidism   Renal/GU negative Renal ROS     Musculoskeletal  (+) Arthritis , Fibromyalgia -  Abdominal (+) + obese,   Peds negative pediatric ROS (+)  Hematology  (+) anemia ,   Anesthesia Other Findings Obese. Wears a fentanyl patch, but not today. Anemic Hb 9.9. EKG LVH. Stress test 2009 OK. Allergic to Latex, dilaudid and Morphine and demerol. Will give only fentanyl in PACU. She denies a stroke, but probably had a TIA in 11/2005. 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  Reproductive/Obstetrics                             Anesthesia Physical  Anesthesia Plan  ASA: III  Anesthesia Plan: General   Post-op Pain Management:    Induction: Intravenous  Airway Management Planned: Oral ETT  Additional Equipment:   Intra-op Plan:   Post-operative Plan:   Informed Consent: I have reviewed the patients History and Physical, chart, labs and discussed the procedure including the risks, benefits and alternatives for the proposed anesthesia with the patient or authorized representative who has indicated  his/her understanding and acceptance.     Plan Discussed with: CRNA  Anesthesia Plan Comments: (Patient had some mild wheezing which improved with nebulizer.  Will do GOT only, as patient is also not interested in regional block and is at increased risk with chronic bronchitis.)       Anesthesia Quick Evaluation

## 2016-11-14 NOTE — Anesthesia Post-op Follow-up Note (Cosign Needed)
Anesthesia QCDR form completed.        

## 2016-11-14 NOTE — Progress Notes (Signed)
Atarax giving for itching per patient request with pain medication.

## 2016-11-14 NOTE — Progress Notes (Signed)
PHARMACIST - PHYSICIAN ORDER COMMUNICATION  CONCERNING: P&T Medication Policy on Herbal Medications  DESCRIPTION:  This patient's order for:  Biotin, Evening Primrose, Riboflavin (B-2)  has been noted.  This product(s) is classified as an "herbal" or natural product. Due to a lack of definitive safety studies or FDA approval, nonstandard manufacturing practices, plus the potential risk of unknown drug-drug interactions while on inpatient medications, the Pharmacy and Therapeutics Committee does not permit the use of "herbal" or natural products of this type within [facility].   ACTION TAKEN: The pharmacy department is unable to verify this order at this time and your patient has been informed of this safety policy. Please reevaluate patient's clinical condition at discharge and address if the herbal or natural product(s) should be resumed at that time.

## 2016-11-14 NOTE — Progress Notes (Signed)
Fentanyl 25 mcg patch under left breast.  Per patient patch is due to be changed today.

## 2016-11-14 NOTE — H&P (Signed)
Paper H&P to be scanned into permanent record. H&P reviewed and patient re-examined. No changes. 

## 2016-11-14 NOTE — Anesthesia Postprocedure Evaluation (Signed)
Anesthesia Post Note  Patient: Rachel Shaffer  Procedure(s) Performed: Procedure(s) (LRB): HARDWARE REMOVAL- SHOULDER (Left) OPEN REDUCTION INTERNAL FIXATION (ORIF) PROXIMAL HUMERUS FRACTURE (Left)  Patient location during evaluation: PACU Anesthesia Type: General Level of consciousness: awake and alert and oriented Pain management: pain level controlled Vital Signs Assessment: post-procedure vital signs reviewed and stable Respiratory status: spontaneous breathing Cardiovascular status: blood pressure returned to baseline Anesthetic complications: no     Last Vitals:  Vitals:   11/14/16 1545 11/14/16 1550  BP: 117/62   Pulse: 72   Resp: 13   Temp:  37.2 C    Last Pain:  Vitals:   11/14/16 1555  TempSrc:   PainSc: 4                  Seith Aikey

## 2016-11-14 NOTE — Op Note (Signed)
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  Post-Op Diagnosis:   Same  Procedure:   Takedown of nonunion with open reduction and internal fixation of two-part surgical neck fracture, left proximal humerus.  Surgeon:   Pascal Lux, MD  Assistant:   Cameron Proud, PA-C  Anesthesia:   GET  Findings:   As above.  Complications:   None  EBL:   200 cc  Fluids:   700 cc crystalloid  UOP:   300 cc  TT:   None  Drains:   None  Closure:   Staples  Implants:   No new implants  Brief Clinical Note:   The patient is a 66 year old female who is now approximately 15 months status post open reduction and internal fixation of a closed displaced 2 part surgical neck fracture of the left proximal humerus. Initially, the patient appeared to be doing well and was lost to follow-up. Approximately 10-14 days ago, she developed increased pain in the left proximal humerus. She presented to the [facility] urgent care facility where x-rays demonstrated that a screw had backed out. Subsequent CT scanning confirmed the presence of a nonunion through the surgical neck. The patient presents at this time for definitive management of this injury.  Procedure:   The patient was brought into the operating room and lain in the supine position. After adequate general endotracheal intubation and anesthesia were obtained, a Foley catheter was inserted before the patient was repositioned in the beach chair position using the beach chair positioner. The left shoulder and upper extremity were prepped with ChloraPrep solution before being draped sterilely. Preoperative antibiotics were administered. After performing a timeout to verify the appropriate surgical site, a standard anterior approach to the shoulder was made utilizing the previous surgical incision. The incision was carried  down through the subcutaneous tissues to expose the deltopectoral fascia. The interval between the deltoid and pectoralis muscles was developed. The conjoined tendon was identified and retracted medially to access the proximal humerus. The Biomet proximal humeral locking plate was readily identified, as well as the screw that had backed out. The screw was removed without difficulty. Several cultures were obtained to be sure that an occult infection had not caused the nonunion.   The nonunion site was identified and debrided carefully using a combination of #15 blades, curettes, rongeurs, and elevators. Care was taken to be sure that any cortical end-caps were removed and/or perforated. One of the pegs that had been placed through an anterior hole was removed as the preoperative CT scan suggested that it was close to perforating the articular cortex. The three cortical screws attaching the humeral plate to the shaft were removed to facilitate exposure of the nonunion site.  Once the nonunion had been removed, some allograft cancellus bone graft chips were packed in and around the nonunion site before the fracture was compressed and temporally secured using a bone tenaculum. The plate was resecured to the humeral shaft using three bicortical screws, two of which were locking screws. Again the adequacy of plate position and fracture reduction was verified fluoroscopically in AP and internal and external rotation views and found to be excellent. Finally, the calcar screw was reintroduced and its position verified fluoroscopically in AP and lateral projections. Multiple fiber wires were placed in the rotator cuff and passed through the appropriate holes in the plate, then  tied securely to further stabilize the humeral head fragment to the plate.  The wound was copiously irrigated with sterile saline solution using jet lavage irrigation before additional allograft cancellus bone chips and 2 cc of demineralized bone  matrix were placed around the fracture site. The deltopectoral interval was reapproximated using 2-0 Vicryl interrupted sutures. The subcutaneous tissues also were closed using 2-0 Vicryl interrupted sutures before the skin was closed using staples. A sterile occlusive dressing was applied to the wound before the arm was placed into a shoulder immobilizer with abduction pillow. A Polar Care also was applied before the patient was awakened, extubated, and returned to the recovery room in satisfactory condition after tolerating the procedure well.

## 2016-11-14 NOTE — Transfer of Care (Signed)
Immediate Anesthesia Transfer of Care Note  Patient: Rachel Shaffer  Procedure(s) Performed: Procedure(s): HARDWARE REMOVAL- SHOULDER (Left) OPEN REDUCTION INTERNAL FIXATION (ORIF) PROXIMAL HUMERUS FRACTURE (Left)  Patient Location: PACU  Anesthesia Type:General  Level of Consciousness: awake, alert  and oriented  Airway & Oxygen Therapy: Patient Spontanous Breathing and Patient connected to face mask oxygen  Post-op Assessment: Report given to RN and Post -op Vital signs reviewed and stable  Post vital signs: Reviewed and stable  Last Vitals:  Vitals:   11/14/16 1445 11/14/16 1446  BP:  132/68  Pulse:  74  Resp:  18  Temp: (P) 37.5 C 37.5 C    Last Pain:  Vitals:   11/14/16 0956  TempSrc: Tympanic  PainSc: 6          Complications: No apparent anesthesia complications

## 2016-11-14 NOTE — Progress Notes (Signed)
Anticoagulation monitoring(Lovenox):  66 yo female ordered Lovenox 40 mg Q24h  Filed Weights   11/14/16 0956  Weight: 245 lb (111.1 kg)   BMI 43.5   Lab Results  Component Value Date   CREATININE 1.47 (H) 11/14/2016   CREATININE 1.01 (H) 05/14/2015   CREATININE 1.08 (H) 05/13/2015   Estimated Creatinine Clearance: 43.9 mL/min (A) (by C-G formula based on SCr of 1.47 mg/dL (H)). Hemoglobin & Hematocrit     Component Value Date/Time   HGB 9.9 (L) 05/16/2015 0812   HGB 13.0 07/28/2014 1456   HCT 30.2 (L) 05/16/2015 0812   HCT 40.3 07/28/2014 1456     Per Protocol for Patient with estCrcl > 30 ml/min and BMI > 40, will transition to Lovenox 40 mg Q12h.

## 2016-11-15 DIAGNOSIS — S42222K 2-part displaced fracture of surgical neck of left humerus, subsequent encounter for fracture with nonunion: Secondary | ICD-10-CM | POA: Diagnosis not present

## 2016-11-15 MED ORDER — OXYCODONE HCL 5 MG PO TABS: 5.0000 mg | ORAL_TABLET | ORAL | 0 refills | Status: DC | PRN

## 2016-11-15 NOTE — Care Management Obs Status (Signed)
[place] NOTIFICATION   Patient Details  Name: Rachel Shaffer MRN: [id number] Date of Birth: 08-23-1950   Medicare Observation Status Notification Given:  Yes    Jolly Mango, RN 11/15/2016, 9:34 AM

## 2016-11-15 NOTE — Progress Notes (Signed)
OT Cancellation Note  Patient Details Name: AJAHNAE RATHGEBER MRN: [id number] DOB: [birth year]   Cancelled Treatment:    Reason Eval/Treat Not Completed: OT screened, no needs identified, will sign off  Harrel Carina, MS, OTR/L 11/15/2016, 10:43 AM

## 2016-11-15 NOTE — Plan of Care (Signed)
Problem: Bowel/Gastric: Goal: Will not experience complications related to bowel motility Outcome: Adequate for Discharge Pt has met all goals for discharge.   

## 2016-11-15 NOTE — Care Management Note (Addendum)
Case Management Note  Patient Details  Name: Rachel Shaffer MRN: [id number] Date of Birth: December 31, 1950  Subjective/Objective:  Met with patient at bedside. She lives at home with her spouse. Open to home health. Prefers [facility]. [facility]erral to [facility] for PT/OT. No DME needs. No Lovenox.                 PCP is [facility].   Action/Plan:   Expected Discharge Date:  11/15/16               Expected Discharge Plan:  [place]  In-House Referral:     Discharge planning Services  CM Consult  Post Acute Care Choice:  Home Health Choice offered to:  Patient  DME Arranged:    DME Agency:     HH Arranged:  PT OT  [employer] Agency:  [place]  Status of Service:  Completed, signed off  If discussed at [place] of Stay Meetings, dates discussed:    Additional Comments:  Jolly Mango, RN 11/15/2016, 9:46 AM

## 2016-11-15 NOTE — Clinical Social Work Note (Signed)
CSW consulted for New SNF. CSW reviewed chart. PT eval is pending, however pt is ready for discharge today and will return home. RNCM is following for discharge planning needs. CSW is signing off as no further needs identified.   Darden Dates, MSW, LCSW  Clinical Social Worker  [phone]

## 2016-11-15 NOTE — Progress Notes (Signed)
Shift assessment completed at 0845. Pt oob to recliner, alert and oriented, c/o headache. Pt asked for fioricet but this was unavailable, pharmacy was texted. Pt has shoulder immobilizer intact to l shoulder, pt is able ot move her fingers and cap refill to fingers is wnl. Pt is on room air, lungs clear bilat, Hr is regular. Abdomen is soft, bs heard. Pt has had small bm. piv to rac intact and free of redness and swelling.ppp, teds on bilat. Dr. Had rounded on pt, and d/c orders in place. This Probation officer contacted Cameron Proud for pt's requested diflucan order, which was transmitted to pt's pharmacy. This Probation officer removed piv with catheter intact, and pt had d/c instructions reviewed and signed. Pt escorted to front entrance of facility at 1055.

## 2016-11-15 NOTE — Progress Notes (Signed)
Subjective: 1 Day Post-Op Procedure(s) (LRB): HARDWARE REMOVAL- SHOULDER (Left) OPEN REDUCTION INTERNAL FIXATION (ORIF) PROXIMAL HUMERUS FRACTURE (Left) Patient reports pain as moderate.   Patient is well, and has had no acute complaints or problems Plan is to go Home after hospital stay. Negative for chest pain and shortness of breath Fever: no Gastrointestinal:Negative for nausea and vomiting  Objective: Vital signs in last 24 hours: Temp:  [97.8 F (36.6 C)-99.5 F (37.5 C)] 98.6 F (37 C) (05/03 2239) Pulse Rate:  [66-89] 70 (05/03 2239) Resp:  [12-19] 18 (05/03 2239) BP: (110-152)/(37-104) 134/53 (05/03 2239) SpO2:  [6 %-100 %] 97 % (05/03 2239) Weight:  [111.1 kg (245 lb)] 111.1 kg (245 lb) (05/03 0956)  Intake/Output from previous day:  Intake/Output Summary (Last 24 hours) at 11/15/16 0758 Last data filed at 11/15/16 0737  Gross per 24 hour  Intake          1828.75 ml  Output             1190 ml  Net           638.75 ml    Intake/Output this shift: Total I/O In: -  Out: 600 [Urine:600]  Labs: No results for input(s): HGB in the last 72 hours. No results for input(s): WBC, RBC, HCT, PLT in the last 72 hours.  Recent Labs  11/14/16 1849  CREATININE 1.47*   No results for input(s): LABPT, INR in the last 72 hours.   EXAM General - Patient is Alert, Appropriate and Oriented Extremity - ABD soft Intact pulses distally Incision: dressing C/D/I No cellulitis present  Patient is intact to light touch to the left arm.  Pulses intact. Dressing/Incision - clean, dry, no drainage Motor Function - intact, moving foot and toes well on exam.    Abdomen soft with normal BS.  Past Medical History:  Diagnosis Date  . Anemia   . Anxiety   . Aortic regurgitation   . Arthritis   . Asthma   . Blood transfusion without reported diagnosis   . Bronchitis, chronic ([place])   . Cataract   . Complication of anesthesia    spinal headache  blood patch  . Coronary  artery disease   . DDD (degenerative disc disease), cervical    all of back  . Depression   . DJD (degenerative joint disease)   . Family history of adverse reaction to anesthesia    sister scoliosis and trouble with some anesthesia  . Fibromyalgia   . Folic acid deficiency   . GERD (gastroesophageal reflux disease)   . Hyperlipidemia   . Hypertension   . Hypothyroidism   . Osteoporosis   . Pain    non cardiac chest pain  . PONV (postoperative nausea and vomiting)   . Sjogren's disease ([place])   . Spinal headache   . Stroke ([place])   . Vasculitis ([place])   . Vitamin D deficiency     Assessment/Plan: 1 Day Post-Op Procedure(s) (LRB): HARDWARE REMOVAL- SHOULDER (Left) OPEN REDUCTION INTERNAL FIXATION (ORIF) PROXIMAL HUMERUS FRACTURE (Left) Active Problems:   Proximal humerus fracture  Estimated body mass index is 43.4 kg/m as calculated from the following:   Height as of this encounter: 5\' 3"  (1.6 m).   Weight as of this encounter: 111.1 kg (245 lb). Advance diet Up with therapy D/C IV fluids when tolerating po intake.  Up with therapy today. Pt is passing gas without difficulty. Plan will be for discharge this afternoon pending progress with PT.  DVT Prophylaxis - Lovenox, Foot Pumps and TED hose Non-weightbearing to the left arm.  Raquel James, PA-C [facility] 11/15/2016, 7:58 AM

## 2016-11-15 NOTE — Care Management CC44 (Signed)
Condition Code 44 Documentation Completed  Patient Details  Name: Rachel Shaffer MRN: [id number] Date of Birth: [birth year]   Condition Code 44 given:  Yes Patient signature on Condition Code 44 notice:  Yes Documentation of 2 MD's agreement:  Yes Code 44 added to claim:  Yes    Jolly Mango, RN 11/15/2016, 9:34 AM

## 2016-11-15 NOTE — Discharge Summary (Signed)
Physician Discharge Summary  Patient ID: Rachel Shaffer MRN: [id number] DOB/AGE: [birth year] 66 y.o.  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  Discharge Diagnoses: Patient Active Problem List   Diagnosis Date Noted  . Proximal humerus fracture 11/14/2016  . Fracture of proximal humerus 08/15/2015  . HTN (hypertension) 05/11/2015  . CAD (coronary artery disease) 05/11/2015  . Anxiety 05/11/2015  . GERD (gastroesophageal reflux disease) 05/11/2015  . BRBPR (bright red blood per rectum) 05/11/2015  . HLD (hyperlipidemia) 05/11/2015  . S/P total knee replacement 04/26/2015  . Lumbar facet arthropathy ([place]) 01/17/2015  . Thoracic facet joint syndrome ([place]) 01/17/2015  . DDD (degenerative disc disease), thoracic 01/17/2015  . DDD (degenerative disc disease), lumbar 01/17/2015  . Bilateral occipital neuralgia 01/17/2015  . 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  Past Medical History:  Diagnosis Date  . Anemia   . Anxiety   . Aortic regurgitation   . Arthritis   . Asthma   . Blood transfusion without reported diagnosis   . Bronchitis, chronic ([place])   . Cataract   . Complication of anesthesia    spinal headache  blood patch  . Coronary artery disease   . DDD (degenerative disc disease), cervical    all of back  . Depression   . DJD (degenerative joint disease)   . Family history of adverse reaction to anesthesia    Rachel Shaffer scoliosis and trouble with some anesthesia  . Fibromyalgia   . Folic acid deficiency   . GERD (gastroesophageal reflux disease)   . Hyperlipidemia   . Hypertension   . Hypothyroidism   . Osteoporosis   . Pain    non cardiac chest pain  . PONV (postoperative nausea and vomiting)   .  Sjogren's disease ([place])   . Spinal headache   . Stroke ([place])   . Vasculitis ([place])   . Vitamin D deficiency      Transfusion: None.   Consultants (if any):   Discharged Condition: Improved  Hospital Course: Rachel Shaffer is an 66 y.o. female who was admitted 11/14/2016 with a diagnosis of nonunion status post prior ORIF of closed displaced two-part surgical neck fracture, left proximal humerus and went to the operating room on 11/14/2016 and underwent the above named procedures.    Surgeries: Procedure(s): HARDWARE REMOVAL- SHOULDER OPEN REDUCTION INTERNAL FIXATION (ORIF) PROXIMAL HUMERUS FRACTURE on 11/14/2016 Patient tolerated the surgery well. Taken to PACU where she was stabilized and then transferred to the orthopedic floor.  Started on Lovenox 40mg  q 12 hrs. Foot pumps applied bilaterally at 80 mm. Heels elevated on bed with rolled towels. No evidence of DVT. Negative [place]. Physical therapy started on day #1 for gait training and transfer. OT started day #1 for ADL and assisted devices.  Patient's IV and foley was d/c on POD1.  Implants: No new implants  Once the nonunion had been removed, some allograft cancellus bone graft chips were packed in and around the nonunion site before the fracture was compressed and temporally secured using a bone tenaculum. The plate was resecured to the humeral shaft using three bicortical screws, two of which were locking screws. Again the adequacy of plate position and fracture reduction was verified fluoroscopically in AP and internal and external rotation views and found to be excellent. Finally, the calcar  screw was reintroduced and its position verified fluoroscopically in AP and lateral projections. Multiple fiber wires were placed in the rotator cuff and passed through the appropriate holes in the plate, then tied securely to further stabilize the humeral head fragment to the plate.  She was given perioperative antibiotics:  Anti-infectives     Start     Dose/Rate Route Frequency Ordered Stop   11/14/16 1700  clindamycin (CLEOCIN) IVPB 900 mg     900 mg 100 mL/hr over 30 Minutes Intravenous Every 6 hours 11/14/16 1631 11/15/16 0645   11/14/16 0630  clindamycin (CLEOCIN) IVPB 900 mg     900 mg 100 mL/hr over 30 Minutes Intravenous  Once 11/14/16 0621 11/14/16 1115    .  She was given sequential compression devices, early ambulation, and Lovenox for DVT prophylaxis.  She benefited maximally from the hospital stay and there were no complications.    Recent vital signs:  Vitals:   11/14/16 2144 11/14/16 2239  BP: (!) 152/67 (!) 134/53  Pulse: 75 70  Resp: 19 18  Temp: 98.3 F (36.8 C) 98.6 F (37 C)    Recent laboratory studies:  Lab Results  Component Value Date   HGB 9.9 (L) 05/16/2015   HGB 9.7 (L) 05/15/2015   HGB 9.2 (L) 05/14/2015   Lab Results  Component Value Date   WBC 6.8 05/16/2015   PLT 406 05/16/2015   Lab Results  Component Value Date   INR 1.00 04/12/2015   Lab Results  Component Value Date   NA 139 05/14/2015   K 4.5 05/14/2015   CL 108 05/14/2015   CO2 27 05/14/2015   BUN 10 05/14/2015   CREATININE 1.47 (H) 11/14/2016   GLUCOSE 103 (H) 05/14/2015    Discharge Medications:   Allergies as of 11/15/2016      Reactions   Ceftin [cefuroxime] Anaphylaxis   Nucynta [tapentadol] Anaphylaxis   Other Anaphylaxis, Swelling, Other (See Comments)   Pt states that she is allergic to kiwi, pineapple, and all nuts.     Cadmium Other (See Comments)   Reaction:  Unknown    Cephalexin Itching, Swelling   Codeine Other (See Comments)   Reaction:  Unknown    Contrast Media [iodinated Diagnostic Agents] Hives   Demerol [meperidine] Other (See Comments)   Pt unsure   Depakote [divalproex Sodium] Other (See Comments)   Reaction:  Unknown    Dilaudid [hydromorphone Hcl] Other (See Comments)   Reaction:  Hallucinations    Feldene [piroxicam] Other (See Comments)   Reaction:  Unknown    Fosamax  [alendronate] Nausea And Vomiting   Imitrex [sumatriptan] Other (See Comments)   Reaction:  Increased heart rate    Latex Itching   redness   Methotrexate Derivatives Other (See Comments)   Reaction:  GI bleeding    Morphine And Related Nausea And Vomiting   Nexium [esomeprazole Magnesium] Other (See Comments)   Reaction:  Unknown    Nsaids Itching   Percocet [oxycodone-acetaminophen] Itching   Protonix [pantoprazole Sodium]    Relafen [nabumetone] Other (See Comments)   Reaction:  Unknown    Tegretol [carbamazepine] Other (See Comments)   Reaction:  Aplastic anemia   Topamax [topiramate] Other (See Comments)   Reaction:  Unknown    Tramadol    Headache   Trazodone And Nefazodone Other (See Comments)   Reaction:  Unknown    Vicodin [hydrocodone-acetaminophen] Itching   Wellbutrin [bupropion] Other (See Comments)   Reaction:  Unknown  Medication List    TAKE these medications   acidophilus Caps capsule Take 1 capsule by mouth daily.   PROBIOTIC DAILY PO Take 1 capsule by mouth daily. 30 billion   albuterol 108 (90 Base) MCG/ACT inhaler Commonly known as:  PROVENTIL HFA;VENTOLIN HFA Inhale 2 puffs into the lungs every 6 (six) hours as needed for wheezing or shortness of breath.   amLODipine 5 MG tablet Commonly known as:  NORVASC Take 5 mg by mouth at bedtime.   aspirin EC 81 MG tablet Take 81 mg by mouth at bedtime.   Biotin 5000 MCG Caps Take 5,000 mcg by mouth daily.   butalbital-acetaminophen-caffeine 50-325-40 MG tablet Commonly known as:  FIORICET, ESGIC Take 1 tablet by mouth every 4 (four) hours as needed for migraine.   citalopram 20 MG tablet Commonly known as:  CELEXA Take 20 mg by mouth 2 (two) times daily.   diphenoxylate-atropine 2.5-0.025 MG tablet Commonly known as:  LOMOTIL Take 1-2 tablets by mouth every 6 (six) hours as needed for diarrhea or loose stools.   EVENING PRIMROSE OIL PO Take 1,300 mg by mouth at bedtime.   famotidine  20 MG tablet Commonly known as:  PEPCID Take 1 tablet (20 mg total) by mouth 2 (two) times daily.   fentaNYL 25 MCG/HR patch Commonly known as:  DURAGESIC - dosed mcg/hr UNW AND APP 1 PA TO SKIN Q THIRD DAY   Fluticasone-Salmeterol 250-50 MCG/DOSE Aepb Commonly known as:  ADVAIR Inhale 1 puff into the lungs 2 (two) times daily as needed.   folic acid 1 MG tablet Commonly known as:  FOLVITE Take 1 mg by mouth daily.   furosemide 20 MG tablet Commonly known as:  LASIX Take 20 mg by mouth daily as needed for edema.   hydrOXYzine 25 MG tablet Commonly known as:  ATARAX/VISTARIL Take 25 mg by mouth 4 (four) times daily as needed for itching.   ibuprofen 800 MG tablet Commonly known as:  ADVIL,MOTRIN Take 800 mg by mouth 3 (three) times daily as needed for mild pain.   lansoprazole 30 MG capsule Commonly known as:  PREVACID Take 30 mg by mouth 2 (two) times daily.   lisinopril 20 MG tablet Commonly known as:  PRINIVIL,ZESTRIL Take 20 mg by mouth 2 (two) times daily.   LORazepam 1 MG tablet Commonly known as:  ATIVAN Take 4 mg by mouth at bedtime.   magnesium oxide 400 MG tablet Commonly known as:  MAG-OX Take 400 mg by mouth at bedtime.   Melatonin 5 MG Tabs Take 1 tablet by mouth at bedtime.   metaxalone 400 MG tablet Commonly known as:  SKELAXIN Take 1 tablet (400 mg total) by mouth 3 (three) times daily. What changed:  how much to take   nystatin powder Generic drug:  nystatin Apply topically 4 (four) times daily.   OVER THE COUNTER MEDICATION Take 1 capsule by mouth daily. Serrapeptase vege capsules   oxyCODONE 5 MG immediate release tablet Commonly known as:  Oxy IR/ROXICODONE Take 1-2 tablets (5-10 mg total) by mouth every 4 (four) hours as needed for breakthrough pain. What changed:  reasons to take this   promethazine 25 MG tablet Commonly known as:  PHENERGAN Take 25 mg by mouth every 6 (six) hours as needed for nausea or vomiting.   propranolol  ER 60 MG 24 hr capsule Commonly known as:  INDERAL LA Take 60 mg by mouth at bedtime.   pyridoxine 100 MG tablet Commonly known as:  B-6  Take 100 mg by mouth daily.   raloxifene 60 MG tablet Commonly known as:  EVISTA Take 60 mg by mouth daily.   simvastatin 40 MG tablet Commonly known as:  ZOCOR Take 40 mg by mouth at bedtime.   vitamin B-12 500 MCG tablet Commonly known as:  CYANOCOBALAMIN Take 500 mcg by mouth every morning.   VITAMIN B-2 PO Take 400 mg by mouth daily.   Vitamin D (Ergocalciferol) 50000 units Caps capsule Commonly known as:  DRISDOL Take 50,000 Units by mouth every 7 (seven) days. Pt takes on Saturday.   Vitamin D 2000 units tablet Take 2,000 Units by mouth daily.       Diagnostic Studies: Ct Shoulder Left Wo Contrast  Result Date: 11/08/2016 CLINICAL DATA:  History of fixation of a proximal left humerus fracture 08/15/2015. The patient fell from a scratch the patient suffered a fracture and a fall off a treadmill 07/18/2015. EXAM: CT OF THE UPPER LEFT EXTREMITY WITHOUT CONTRAST TECHNIQUE: Multidetector CT imaging of the upper left extremity was performed according to the standard protocol. COMPARISON:  Plain films 07/18/2015 and intraoperative fluoroscopic spot the 08/15/2015. FINDINGS: Bones/Joint/Cartilage Plate and screws fixing a surgical neck fracture of the left humerus are in place. The fourth screw from the bottom of the plate has nearly completely backed out extending approximately 4 cm lateral to the plate with only 1.3 cm of the medial aspect of the screw still in the humerus. There is lucency about this segment screw in the humerus. There is also lucency about the third screw from the bottom of the play consistent with loosening. The medial 1 cm of the anterior most screw at the level of the inferior lesser tuberosity is not covered by bone. Hardware is otherwise intact. There is no bridging bone across the patient's fracture. The fracture margins  are largely corticated consistent with nonunion. There is some callus formation off the inferior and lateral margin of the fracture extending superiorly. Moderate acromioclavicular osteoarthritis is noted. Ligaments Suboptimally assessed by CT. Muscles and Tendons The rotator cuff and other musculature about the shoulder appears intact. Soft tissues Aortic atherosclerosis is present. Visualized lung parenchyma is clear. 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 Fixation hardware is in place. Largest screw which has positioned fourth from the bottom of the plate has nearly completely backed out of bone. Lucency is seen about the screw which is third from the bottom of the plate consistent with loosening. Medial 1 cm of an anterior screw at the level of the inferior lesser tuberosity of the humerus is not covered by bone. Moderate acromioclavicular osteoarthritis. Rotator cuff appears intact. Atherosclerosis. Electronically Signed   By: Inge Rise M.D.   On: 11/08/2016 16:30   Dg Shoulder Left Port  Result Date: 11/14/2016 CLINICAL DATA:  Status post left humerus surgery. EXAM: LEFT SHOULDER - 1 VIEW COMPARISON:  11/14/2016 FINDINGS: Surgical clips overlie the shoulder. Patient has undergone ORIF of the left humerus with lateral screw plate and numerous screws. No evidence for dislocation on the views provided. The glenoid is not well visualized. Left lung apex is clear. IMPRESSION: Negative. Electronically Signed   By: Nolon Nations M.D.   On: 11/14/2016 15:40   Dg Humerus Left  Result Date: 11/14/2016 CLINICAL DATA:  Left-sided surgery. EXAM: LEFT HUMERUS - 2+ VIEW; DG C-ARM 61-120 MIN COMPARISON:  None. FINDINGS: A plate is affixed to the proximal left humerus by multiple screws. Hardware is in good position. IMPRESSION:  Surgical hardware associated with the humerus is in good position. Electronically Signed   By: Dorise Bullion III M.D   On: 11/14/2016 13:53    Dg C-arm 61-120 Min  Result Date: 11/14/2016 CLINICAL DATA:  Left-sided surgery. EXAM: LEFT HUMERUS - 2+ VIEW; DG C-ARM 61-120 MIN COMPARISON:  None. FINDINGS: A plate is affixed to the proximal left humerus by multiple screws. Hardware is in good position. IMPRESSION: Surgical hardware associated with the humerus is in good position. Electronically Signed   By: Dorise Bullion III M.D   On: 11/14/2016 13:53   Disposition: Plan will be for discharge home today pending progress with PT.  Cultures were obtained at the time of surgery, will continue to follow these.  Follow-up Information    Judson Roch, PA-C Follow up in 14 day(s).   Specialty:  Physician Assistant Why:  Electa Sniff information: [place] [address] [phone]          Signed: Judson Roch PA-C 11/15/2016, 8:03 AM

## 2016-11-15 NOTE — Discharge Instructions (Signed)
Diet: As you were doing prior to hospitalization   Shower:  May shower but keep the wounds dry, use an occlusive plastic wrap, NO SOAKING IN TUB.  If the bandage gets wet, change with a clean dry gauze.  Dressing:  You may change your dressing as needed. Change the dressing with sterile gauze dressing.    Activity:  Increase activity slowly as tolerated, but follow the weight bearing instructions below.  No lifting or driving for 6 weeks.  Weight Bearing:   Non-weightbearing to the left arm.  Blood Clot Prevention:  Continue on daily dose of aspirin.  To prevent constipation: you may use a stool softener such as -  Colace (over the counter) 100 mg by mouth twice a day  Drink plenty of fluids (prune juice may be helpful) and high fiber foods Miralax (over the counter) for constipation as needed.    Itching:  If you experience itching with your medications, try taking only a single pain pill, or even half a pain pill at a time.  You may take up to 10 pain pills per day, and you can also use benadryl over the counter for itching or also to help with sleep.   Precautions:  If you experience chest pain or shortness of breath - call 911 immediately for transfer to the hospital emergency department!!  If you develop a fever greater that 101 F, purulent drainage from wound, increased redness or drainage from wound, or calf pain-Call [place]                                              Follow- Up Appointment:  Please call for an appointment to be seen in 2 weeks at [facility]

## 2016-11-15 NOTE — Evaluation (Signed)
Physical Therapy Evaluation Patient Details Name: Rachel Shaffer MRN: [id number] DOB: [birth year] Today's Date: 11/15/2016   History of Present Illness  66 y/o female who fell >1 year ago and suffered a L proximal humerus fx with ORIF, she had hardware issues and needed removal and repair.  Clinical Impression  Pt did well with getting to EOB, rising to standing and doing 2 laps of in-room ambulation.  She was fatigued with the effort, but generally showed good safety and balance.  She did not have any LOBs and reports being close to her baseline regarding mobility.  Pt has been through similar surgery previously and reports feeling good about precautions and expected course of rehab/recovery.    Follow Up Recommendations Home health PT (per ortho recommendations, likely HHPT)    Equipment Recommendations       Recommendations for Other Services       Precautions / Restrictions Precautions Precautions: Fall Restrictions Weight Bearing Restrictions: Yes LUE Weight Bearing: Non weight bearing      Mobility  Bed Mobility Overal bed mobility: Modified Independent             General bed mobility comments: Pt did need rails to pull herself up to EOB, but did not need direct assist  Transfers Overall transfer level: Modified independent Equipment used: Straight cane             General transfer comment: Pt was able to rise to standing w/o direct assist, had some initial unsteadiness but was able to maintain balance and safety w/o direct assist`  Ambulation/Gait Ambulation/Gait assistance: Min guard Ambulation Distance (Feet): 50 Feet Assistive device: Straight cane       General Gait Details: Pt with slow, guarded ambulation but no LOBs.  She fatigued signficantly with the limited distance, but also reports she would never have to do more than she did once in the home.  Stairs            Wheelchair Mobility    Modified Rankin (Stroke Patients Only)        Balance Overall balance assessment: Modified Independent (with cane)                                           Pertinent Vitals/Pain Pain Assessment: 0-10 Pain Score: 6  Pain Location: L shoulder    Home Living Family/patient expects to be discharged to:: Private residence Living Arrangements: Spouse/significant other Available Help at Discharge: Family Type of Home: House Home Access: Level entry     Home Layout: Two level;Able to live on main level with bedroom/bathroom Home Equipment: Kasandra Knudsen - single point      Prior Function Level of Independence: Independent with assistive device(s)         Comments: Pt reports she has not done much in the community for 2 years, states she can do in-home activities relatively well     Hand Dominance        Extremity/Trunk Assessment   Upper Extremity Assessment Upper Extremity Assessment: Overall WFL for tasks assessed;LUE deficits/detail (R UE grossly 4-/5 t/o) LUE Deficits / Details: L in immobilizer sling, able to open/close fist, moderate elbow movement/strength no resisted acts    Lower Extremity Assessment Lower Extremity Assessment: Overall WFL for tasks assessed       Communication   Communication: No difficulties  Cognition Arousal/Alertness: Awake/alert Behavior During Therapy: [facility] for  tasks assessed/performed Overall Cognitive Status: Within Functional Limits for tasks assessed                                        General Comments      Exercises     Assessment/Plan    PT Assessment Patient needs continued PT services  PT Problem List         PT Treatment Interventions DME instruction;Gait training;Stair training;Functional mobility training;Therapeutic activities;Therapeutic exercise;Balance training    PT Goals (Current goals can be found in the Care Plan section)  Acute Rehab PT Goals Patient Stated Goal: Go home PT Goal Formulation: With  patient/family Time For Goal Achievement: 11/29/16 Potential to Achieve Goals: Good    Frequency BID   Barriers to discharge        Co-evaluation               AM-PAC PT "6 Clicks" Daily Activity  Outcome Measure Difficulty turning over in bed (including adjusting bedclothes, sheets and blankets)?: A Little Difficulty moving from lying on back to sitting on the side of the bed? : A Lot Difficulty sitting down on and standing up from a chair with arms (e.g., wheelchair, bedside commode, etc,.)?: A Lot Help needed moving to and from a bed to chair (including a wheelchair)?: A Little Help needed walking in hospital room?: A Little Help needed climbing 3-5 steps with a railing? : A Lot 6 Click Score: 15    End of Session Equipment Utilized During Treatment: Gait belt (L shoulder immobilizer) Activity Tolerance: Patient limited by pain;Patient limited by fatigue Patient left: with chair alarm set;with call bell/phone within reach Nurse Communication: Mobility status PT Visit Diagnosis: Muscle weakness (generalized) (M62.81);Pain;Difficulty in walking, not elsewhere classified (R26.2) Pain - Right/Left: Left Pain - part of body: Shoulder    Time: 5300-5110 PT Time Calculation (min) (ACUTE ONLY): 23 min   Charges:   PT Evaluation $PT Eval Low Complexity: 1 Procedure     PT G Codes:   PT G-Codes **NOT FOR INPATIENT CLASS** Functional Assessment Tool Used: AM-PAC 6 Clicks Basic Mobility Functional Limitation: Mobility: Walking and moving around Mobility: Walking and Moving Around Current Status (Y1117): At least 40 percent but less than 60 percent impaired, limited or restricted Mobility: Walking and Moving Around Goal Status [phone]): At least 1 percent but less than 20 percent impaired, limited or restricted    Kreg Shropshire, DPT 11/15/2016, 11:02 AM

## 2016-11-15 NOTE — Care Management Obs Status (Signed)
[facility] NOTIFICATION   Patient Details  Name: Rachel Shaffer MRN: [id number] Date of Birth: January 30, 1951   Medicare Observation Status Notification Given:       Jolly Mango, RN 11/15/2016, 9:34 AM

## 2016-12-05 LAB — AEROBIC/ANAEROBIC CULTURE (SURGICAL/DEEP WOUND)
CULTURE: NO GROWTH
GRAM STAIN: NONE SEEN

## 2016-12-05 LAB — AEROBIC/ANAEROBIC CULTURE W GRAM STAIN (SURGICAL/DEEP WOUND): Culture: NO GROWTH

## 2017-01-29 ENCOUNTER — Ambulatory Visit
Admission: RE | Admit: 2017-01-29 | Discharge: 2017-01-29 | Disposition: A | Discharge status: Home / Self Care | Payer: Medicare Other | Source: Ambulatory Visit | Attending: Student | Admitting: Student

## 2017-01-29 ENCOUNTER — Other Ambulatory Visit: Payer: Self-pay | Admitting: Student

## 2017-01-29 ENCOUNTER — Other Ambulatory Visit
Admission: RE | Admit: 2017-01-29 | Discharge: 2017-01-29 | Disposition: A | Discharge status: Home / Self Care | Payer: Medicare Other | Source: Ambulatory Visit | Attending: Family Medicine | Admitting: Family Medicine

## 2017-01-29 DIAGNOSIS — Z9889 Other specified postprocedural states: Principal | ICD-10-CM

## 2017-01-29 DIAGNOSIS — Z8781 Personal history of (healed) traumatic fracture: Secondary | ICD-10-CM | POA: Insufficient documentation

## 2017-01-29 DIAGNOSIS — M7712 Lateral epicondylitis, left elbow: Secondary | ICD-10-CM | POA: Diagnosis not present

## 2017-01-29 DIAGNOSIS — Z967 Presence of other bone and tendon implants: Secondary | ICD-10-CM | POA: Diagnosis present

## 2017-01-29 LAB — FIBRIN DERIVATIVES D-DIMER (ARMC ONLY): FIBRIN DERIVATIVES D-DIMER (ARMC): 2072.89 — AB (ref 0.00–499.00)

## 2017-07-11 ENCOUNTER — Other Ambulatory Visit: Payer: Self-pay | Admitting: Surgery

## 2017-07-11 ENCOUNTER — Ambulatory Visit
Admission: RE | Admit: 2017-07-11 | Discharge: 2017-07-11 | Disposition: A | Discharge status: Home / Self Care | Payer: Medicare Other | Source: Ambulatory Visit | Attending: Surgery | Admitting: Surgery

## 2017-07-11 DIAGNOSIS — S42222D 2-part displaced fracture of surgical neck of left humerus, subsequent encounter for fracture with routine healing: Secondary | ICD-10-CM | POA: Diagnosis not present

## 2017-07-11 DIAGNOSIS — S42295A Other nondisplaced fracture of upper end of left humerus, initial encounter for closed fracture: Secondary | ICD-10-CM | POA: Insufficient documentation

## 2017-07-11 DIAGNOSIS — X58XXXD Exposure to other specified factors, subsequent encounter: Secondary | ICD-10-CM | POA: Diagnosis not present

## 2017-07-21 ENCOUNTER — Other Ambulatory Visit
Admission: RE | Admit: 2017-07-21 | Discharge: 2017-07-21 | Disposition: A | Discharge status: Home / Self Care | Payer: Medicare Other | Source: Ambulatory Visit | Attending: Orthopedic Surgery | Admitting: Orthopedic Surgery

## 2017-07-21 DIAGNOSIS — M810 Age-related osteoporosis without current pathological fracture: Secondary | ICD-10-CM | POA: Diagnosis present

## 2017-07-21 LAB — URINALYSIS, COMPLETE (UACMP) WITH MICROSCOPIC
BILIRUBIN URINE: NEGATIVE
Bacteria, UA: NONE SEEN
Glucose, UA: NEGATIVE mg/dL
Hgb urine dipstick: NEGATIVE
KETONES UR: NEGATIVE mg/dL
LEUKOCYTES UA: NEGATIVE
Nitrite: NEGATIVE
PH: 5 (ref 5.0–8.0)
Protein, ur: NEGATIVE mg/dL
Specific Gravity, Urine: 1.019 (ref 1.005–1.030)

## 2017-07-21 LAB — CBC WITH DIFFERENTIAL/PLATELET
Basophils Absolute: 0.1 10*3/uL (ref 0–0.1)
Basophils Relative: 1 %
EOS ABS: 0.7 10*3/uL (ref 0–0.7)
EOS PCT: 9 %
HCT: 38.6 % (ref 35.0–47.0)
Hemoglobin: 12.5 g/dL (ref 12.0–16.0)
LYMPHS ABS: 1.1 10*3/uL (ref 1.0–3.6)
LYMPHS PCT: 15 %
MCH: 29.5 pg (ref 26.0–34.0)
MCHC: 32.4 g/dL (ref 32.0–36.0)
MCV: 91.1 fL (ref 80.0–100.0)
MONO ABS: 0.5 10*3/uL (ref 0.2–0.9)
MONOS PCT: 7 %
Neutro Abs: 4.7 10*3/uL (ref 1.4–6.5)
Neutrophils Relative %: 68 %
PLATELETS: 280 10*3/uL (ref 150–440)
RBC: 4.24 MIL/uL (ref 3.80–5.20)
RDW: 14.9 % — ABNORMAL HIGH (ref 11.5–14.5)
WBC: 7 10*3/uL (ref 3.6–11.0)

## 2017-07-21 LAB — COMPREHENSIVE METABOLIC PANEL
ALT: 11 U/L — AB (ref 14–54)
ANION GAP: 11 (ref 5–15)
AST: 26 U/L (ref 15–41)
Albumin: 3.9 g/dL (ref 3.5–5.0)
Alkaline Phosphatase: 90 U/L (ref 38–126)
BUN: 14 mg/dL (ref 6–20)
CALCIUM: 8.8 mg/dL — AB (ref 8.9–10.3)
CHLORIDE: 106 mmol/L (ref 101–111)
CO2: 23 mmol/L (ref 22–32)
CREATININE: 1.11 mg/dL — AB (ref 0.44–1.00)
GFR calc non Af Amer: 50 mL/min — ABNORMAL LOW (ref >60)
GFR, EST AFRICAN AMERICAN: 58 mL/min — AB (ref >60)
Glucose, Bld: 103 mg/dL — ABNORMAL HIGH (ref 65–99)
Potassium: 3.5 mmol/L (ref 3.5–5.1)
SODIUM: 140 mmol/L (ref 135–145)
Total Bilirubin: 0.3 mg/dL (ref 0.3–1.2)
Total Protein: 7.8 g/dL (ref 6.5–8.1)

## 2017-07-21 LAB — HEMOGLOBIN A1C
Hgb A1c MFr Bld: 5.2 % (ref 4.8–5.6)
Mean Plasma Glucose: 102.54 mg/dL

## 2017-07-21 LAB — C-REACTIVE PROTEIN

## 2017-07-21 LAB — PREALBUMIN: Prealbumin: 25.7 mg/dL (ref 18–38)

## 2017-07-21 LAB — SEDIMENTATION RATE: Sed Rate: 24 mm/hr (ref 0–30)

## 2017-07-21 LAB — MAGNESIUM: MAGNESIUM: 1.8 mg/dL (ref 1.7–2.4)

## 2017-07-21 LAB — PHOSPHORUS: PHOSPHORUS: 2.6 mg/dL (ref 2.5–4.6)

## 2017-07-21 LAB — TRANSFERRIN: TRANSFERRIN: 241 mg/dL (ref 192–382)

## 2017-07-21 LAB — TSH: TSH: 1.266 u[IU]/mL (ref 0.350–4.500)

## 2017-07-22 LAB — CALCITRIOL (1,25 DI-OH VIT D): VIT D 1 25 DIHYDROXY: 68.3 pg/mL (ref 19.9–79.3)

## 2017-07-22 LAB — PTH, INTACT AND CALCIUM
CALCIUM TOTAL (PTH): 8.9 mg/dL (ref 8.7–10.3)
PTH: 79 pg/mL — ABNORMAL HIGH (ref 15–65)

## 2017-07-22 LAB — CALCIUM, IONIZED: Calcium, Ionized, Serum: 4.9 mg/dL (ref 4.5–5.6)

## 2017-07-22 LAB — VITAMIN D 25 HYDROXY (VIT D DEFICIENCY, FRACTURES): VIT D 25 HYDROXY: 65.2 ng/mL (ref 30.0–100.0)

## 2017-08-04 ENCOUNTER — Other Ambulatory Visit: Payer: Self-pay

## 2017-08-04 ENCOUNTER — Encounter (HOSPITAL_COMMUNITY): Payer: Self-pay | Admitting: *Deleted

## 2017-08-04 NOTE — Progress Notes (Signed)
I instructed patient to stop taking vitamins, NSAID's and herbal products.  Patient stopped Aspirin on 1/7 when she found out that she is having surgery.

## 2017-08-05 ENCOUNTER — Ambulatory Visit (HOSPITAL_COMMUNITY): Payer: Medicare Other | Admitting: Certified Registered"

## 2017-08-05 ENCOUNTER — Ambulatory Visit (HOSPITAL_COMMUNITY)
Admission: RE | Admit: 2017-08-05 | Discharge: 2017-08-05 | Disposition: A | Discharge status: Home / Self Care | Payer: Medicare Other | Source: Ambulatory Visit | Attending: Orthopedic Surgery | Admitting: Orthopedic Surgery

## 2017-08-05 ENCOUNTER — Encounter (HOSPITAL_COMMUNITY): Payer: Self-pay | Admitting: Urology

## 2017-08-05 ENCOUNTER — Ambulatory Visit (HOSPITAL_COMMUNITY): Payer: Medicare Other

## 2017-08-05 ENCOUNTER — Encounter (HOSPITAL_COMMUNITY)
Admission: RE | Disposition: A | Discharge status: Home / Self Care | Payer: Self-pay | Source: Ambulatory Visit | Attending: Orthopedic Surgery

## 2017-08-05 DIAGNOSIS — Z01818 Encounter for other preprocedural examination: Secondary | ICD-10-CM

## 2017-08-05 DIAGNOSIS — F329 Major depressive disorder, single episode, unspecified: Secondary | ICD-10-CM | POA: Insufficient documentation

## 2017-08-05 DIAGNOSIS — E559 Vitamin D deficiency, unspecified: Secondary | ICD-10-CM | POA: Insufficient documentation

## 2017-08-05 DIAGNOSIS — W19XXXD Unspecified fall, subsequent encounter: Secondary | ICD-10-CM | POA: Insufficient documentation

## 2017-08-05 DIAGNOSIS — Z419 Encounter for procedure for purposes other than remedying health state, unspecified: Secondary | ICD-10-CM

## 2017-08-05 DIAGNOSIS — Z96651 Presence of right artificial knee joint: Secondary | ICD-10-CM | POA: Insufficient documentation

## 2017-08-05 DIAGNOSIS — I351 Nonrheumatic aortic (valve) insufficiency: Secondary | ICD-10-CM | POA: Diagnosis not present

## 2017-08-05 DIAGNOSIS — S42212K Unspecified displaced fracture of surgical neck of left humerus, subsequent encounter for fracture with nonunion: Secondary | ICD-10-CM | POA: Insufficient documentation

## 2017-08-05 DIAGNOSIS — Z87891 Personal history of nicotine dependence: Secondary | ICD-10-CM | POA: Insufficient documentation

## 2017-08-05 DIAGNOSIS — D649 Anemia, unspecified: Secondary | ICD-10-CM | POA: Insufficient documentation

## 2017-08-05 DIAGNOSIS — Z8673 Personal history of transient ischemic attack (TIA), and cerebral infarction without residual deficits: Secondary | ICD-10-CM | POA: Insufficient documentation

## 2017-08-05 DIAGNOSIS — S42302K Unspecified fracture of shaft of humerus, left arm, subsequent encounter for fracture with nonunion: Secondary | ICD-10-CM | POA: Insufficient documentation

## 2017-08-05 DIAGNOSIS — J449 Chronic obstructive pulmonary disease, unspecified: Secondary | ICD-10-CM | POA: Diagnosis not present

## 2017-08-05 DIAGNOSIS — T148XXA Other injury of unspecified body region, initial encounter: Secondary | ICD-10-CM

## 2017-08-05 DIAGNOSIS — Z452 Encounter for adjustment and management of vascular access device: Secondary | ICD-10-CM

## 2017-08-05 DIAGNOSIS — Y93A1 Activity, exercise machines primarily for cardiorespiratory conditioning: Secondary | ICD-10-CM | POA: Insufficient documentation

## 2017-08-05 DIAGNOSIS — Z7951 Long term (current) use of inhaled steroids: Secondary | ICD-10-CM | POA: Insufficient documentation

## 2017-08-05 DIAGNOSIS — Z79899 Other long term (current) drug therapy: Secondary | ICD-10-CM | POA: Insufficient documentation

## 2017-08-05 DIAGNOSIS — E785 Hyperlipidemia, unspecified: Secondary | ICD-10-CM | POA: Insufficient documentation

## 2017-08-05 DIAGNOSIS — Z791 Long term (current) use of non-steroidal anti-inflammatories (NSAID): Secondary | ICD-10-CM | POA: Insufficient documentation

## 2017-08-05 DIAGNOSIS — M810 Age-related osteoporosis without current pathological fracture: Secondary | ICD-10-CM | POA: Insufficient documentation

## 2017-08-05 DIAGNOSIS — K219 Gastro-esophageal reflux disease without esophagitis: Secondary | ICD-10-CM | POA: Insufficient documentation

## 2017-08-05 DIAGNOSIS — I2510 Atherosclerotic heart disease of native coronary artery without angina pectoris: Secondary | ICD-10-CM | POA: Diagnosis not present

## 2017-08-05 DIAGNOSIS — Z79891 Long term (current) use of opiate analgesic: Secondary | ICD-10-CM | POA: Diagnosis not present

## 2017-08-05 DIAGNOSIS — I10 Essential (primary) hypertension: Secondary | ICD-10-CM | POA: Diagnosis not present

## 2017-08-05 HISTORY — DX: Dyspnea, unspecified: R06.00

## 2017-08-05 HISTORY — DX: Unspecified convulsions: R56.9

## 2017-08-05 HISTORY — DX: Personal history of urinary calculi: Z87.442

## 2017-08-05 HISTORY — PX: HARDWARE REMOVAL: SHX979

## 2017-08-05 HISTORY — PX: ORIF HUMERUS FRACTURE: SHX2126

## 2017-08-05 HISTORY — DX: Chronic obstructive pulmonary disease, unspecified: J44.9

## 2017-08-05 LAB — TYPE AND SCREEN
ABO/RH(D): O POS
ANTIBODY SCREEN: NEGATIVE

## 2017-08-05 LAB — COMPREHENSIVE METABOLIC PANEL
ALT: 10 U/L — AB (ref 14–54)
AST: 20 U/L (ref 15–41)
Albumin: 3.3 g/dL — ABNORMAL LOW (ref 3.5–5.0)
Alkaline Phosphatase: 83 U/L (ref 38–126)
Anion gap: 12 (ref 5–15)
BUN: 13 mg/dL (ref 6–20)
CHLORIDE: 106 mmol/L (ref 101–111)
CO2: 22 mmol/L (ref 22–32)
CREATININE: 1.18 mg/dL — AB (ref 0.44–1.00)
Calcium: 8.6 mg/dL — ABNORMAL LOW (ref 8.9–10.3)
GFR calc non Af Amer: 46 mL/min — ABNORMAL LOW (ref >60)
GFR, EST AFRICAN AMERICAN: 54 mL/min — AB (ref >60)
Glucose, Bld: 88 mg/dL (ref 65–99)
Potassium: 3.6 mmol/L (ref 3.5–5.1)
SODIUM: 140 mmol/L (ref 135–145)
Total Bilirubin: 0.5 mg/dL (ref 0.3–1.2)
Total Protein: 6.5 g/dL (ref 6.5–8.1)

## 2017-08-05 LAB — RAPID URINE DRUG SCREEN, HOSP PERFORMED
Amphetamines: NOT DETECTED
Barbiturates: POSITIVE — AB
Benzodiazepines: POSITIVE — AB
Cocaine: NOT DETECTED
Opiates: NOT DETECTED
Tetrahydrocannabinol: NOT DETECTED

## 2017-08-05 LAB — URINALYSIS, ROUTINE W REFLEX MICROSCOPIC
BILIRUBIN URINE: NEGATIVE
GLUCOSE, UA: NEGATIVE mg/dL
Hgb urine dipstick: NEGATIVE
Ketones, ur: NEGATIVE mg/dL
LEUKOCYTES UA: NEGATIVE
Nitrite: NEGATIVE
PROTEIN: NEGATIVE mg/dL
Specific Gravity, Urine: 1.013 (ref 1.005–1.030)
pH: 6 (ref 5.0–8.0)

## 2017-08-05 LAB — ABO/RH: ABO/RH(D): O POS

## 2017-08-05 LAB — APTT: aPTT: 28 seconds (ref 24–36)

## 2017-08-05 LAB — CBC WITH DIFFERENTIAL/PLATELET
BASOS ABS: 0.1 10*3/uL (ref 0.0–0.1)
Basophils Relative: 1 %
EOS ABS: 0.5 10*3/uL (ref 0.0–0.7)
EOS PCT: 8 %
HCT: 37.7 % (ref 36.0–46.0)
Hemoglobin: 11.9 g/dL — ABNORMAL LOW (ref 12.0–15.0)
LYMPHS ABS: 1.2 10*3/uL (ref 0.7–4.0)
Lymphocytes Relative: 18 %
MCH: 29.2 pg (ref 26.0–34.0)
MCHC: 31.6 g/dL (ref 30.0–36.0)
MCV: 92.4 fL (ref 78.0–100.0)
Monocytes Absolute: 0.7 10*3/uL (ref 0.1–1.0)
Monocytes Relative: 10 %
Neutro Abs: 4.2 10*3/uL (ref 1.7–7.7)
Neutrophils Relative %: 63 %
PLATELETS: 281 10*3/uL (ref 150–400)
RBC: 4.08 MIL/uL (ref 3.87–5.11)
RDW: 13.9 % (ref 11.5–15.5)
WBC: 6.7 10*3/uL (ref 4.0–10.5)

## 2017-08-05 LAB — PROTIME-INR
INR: 1
Prothrombin Time: 13.1 seconds (ref 11.4–15.2)

## 2017-08-05 SURGERY — OPEN REDUCTION INTERNAL FIXATION (ORIF) PROXIMAL HUMERUS FRACTURE
Anesthesia: Regional | Laterality: Left

## 2017-08-05 MED ORDER — LACTATED RINGERS IV SOLN
INTRAVENOUS | Status: DC
  Administered 2017-08-05: 10:00:00 via INTRAVENOUS

## 2017-08-05 MED ORDER — OXYCODONE HCL 5 MG PO TABS: 5.0000 mg | ORAL_TABLET | Freq: Four times a day (QID) | ORAL | 0 refills | Status: AC | PRN

## 2017-08-05 MED ORDER — BUPIVACAINE-EPINEPHRINE (PF) 0.5% -1:200000 IJ SOLN
INTRAMUSCULAR | Status: DC | PRN
  Administered 2017-08-05: 30 mL via PERINEURAL

## 2017-08-05 MED ORDER — MIDAZOLAM HCL 5 MG/5ML IJ SOLN
INTRAMUSCULAR | Status: DC | PRN
  Administered 2017-08-05: 2 mg via INTRAVENOUS

## 2017-08-05 MED ORDER — PROPOFOL 10 MG/ML IV BOLUS
INTRAVENOUS | Status: AC
  Filled 2017-08-05: qty 20

## 2017-08-05 MED ORDER — GLYCOPYRROLATE 0.2 MG/ML IJ SOLN
INTRAMUSCULAR | Status: DC | PRN
  Administered 2017-08-05: 0.2 mg via INTRAVENOUS

## 2017-08-05 MED ORDER — VANCOMYCIN HCL 1000 MG IV SOLR
INTRAVENOUS | Status: DC | PRN
  Administered 2017-08-05: 1000 mg via INTRAVENOUS

## 2017-08-05 MED ORDER — DEXAMETHASONE SODIUM PHOSPHATE 10 MG/ML IJ SOLN
INTRAMUSCULAR | Status: AC
  Filled 2017-08-05: qty 1

## 2017-08-05 MED ORDER — CHLORHEXIDINE GLUCONATE 4 % EX LIQD: 60.0000 mL | Freq: Once | CUTANEOUS | Status: DC

## 2017-08-05 MED ORDER — ONDANSETRON HCL 4 MG/2ML IJ SOLN
INTRAMUSCULAR | Status: DC | PRN
  Administered 2017-08-05: 4 mg via INTRAVENOUS

## 2017-08-05 MED ORDER — FENTANYL CITRATE (PF) 100 MCG/2ML IJ SOLN
50.0000 ug | Freq: Once | INTRAMUSCULAR | Status: AC
  Administered 2017-08-05: 50 ug via INTRAVENOUS
  Filled 2017-08-05: qty 1

## 2017-08-05 MED ORDER — PHENYLEPHRINE 40 MCG/ML (10ML) SYRINGE FOR IV PUSH (FOR BLOOD PRESSURE SUPPORT)
PREFILLED_SYRINGE | INTRAVENOUS | Status: DC | PRN
  Administered 2017-08-05: 80 ug via INTRAVENOUS

## 2017-08-05 MED ORDER — 0.9 % SODIUM CHLORIDE (POUR BTL) OPTIME
TOPICAL | Status: DC | PRN
  Administered 2017-08-05: 1000 mL

## 2017-08-05 MED ORDER — LIDOCAINE 2% (20 MG/ML) 5 ML SYRINGE
INTRAMUSCULAR | Status: AC
  Filled 2017-08-05: qty 15

## 2017-08-05 MED ORDER — PHENYLEPHRINE HCL 10 MG/ML IJ SOLN
INTRAVENOUS | Status: DC | PRN
  Administered 2017-08-05: 25 ug/min via INTRAVENOUS

## 2017-08-05 MED ORDER — MIDAZOLAM HCL 2 MG/2ML IJ SOLN
INTRAMUSCULAR | Status: AC
  Administered 2017-08-05: 2 mg via INTRAVENOUS
  Filled 2017-08-05: qty 2

## 2017-08-05 MED ORDER — MIDAZOLAM HCL 2 MG/2ML IJ SOLN
INTRAMUSCULAR | Status: AC
  Filled 2017-08-05: qty 2

## 2017-08-05 MED ORDER — LACTATED RINGERS IV SOLN
INTRAVENOUS | Status: DC | PRN
  Administered 2017-08-05: 12:00:00 via INTRAVENOUS

## 2017-08-05 MED ORDER — SUGAMMADEX SODIUM 200 MG/2ML IV SOLN
INTRAVENOUS | Status: DC | PRN
  Administered 2017-08-05: 200 mg via INTRAVENOUS

## 2017-08-05 MED ORDER — FENTANYL CITRATE (PF) 250 MCG/5ML IJ SOLN
INTRAMUSCULAR | Status: DC | PRN
  Administered 2017-08-05: 50 ug via INTRAVENOUS

## 2017-08-05 MED ORDER — PROPOFOL 10 MG/ML IV BOLUS
INTRAVENOUS | Status: DC | PRN
  Administered 2017-08-05: 150 mg via INTRAVENOUS

## 2017-08-05 MED ORDER — ONDANSETRON HCL 4 MG/2ML IJ SOLN
INTRAMUSCULAR | Status: AC
  Filled 2017-08-05: qty 2

## 2017-08-05 MED ORDER — VANCOMYCIN HCL IN DEXTROSE 1-5 GM/200ML-% IV SOLN
INTRAVENOUS | Status: AC
  Filled 2017-08-05: qty 200

## 2017-08-05 MED ORDER — DEXAMETHASONE SODIUM PHOSPHATE 10 MG/ML IJ SOLN
INTRAMUSCULAR | Status: DC | PRN
  Administered 2017-08-05: 5 mg via INTRAVENOUS

## 2017-08-05 MED ORDER — FENTANYL CITRATE (PF) 250 MCG/5ML IJ SOLN
INTRAMUSCULAR | Status: AC
  Filled 2017-08-05: qty 5

## 2017-08-05 MED ORDER — ROCURONIUM BROMIDE 10 MG/ML (PF) SYRINGE
PREFILLED_SYRINGE | INTRAVENOUS | Status: DC | PRN
  Administered 2017-08-05: 30 mg via INTRAVENOUS
  Administered 2017-08-05: 20 mg via INTRAVENOUS
  Administered 2017-08-05: 30 mg via INTRAVENOUS
  Administered 2017-08-05: 50 mg via INTRAVENOUS

## 2017-08-05 MED ORDER — FENTANYL CITRATE (PF) 100 MCG/2ML IJ SOLN
INTRAMUSCULAR | Status: AC
  Administered 2017-08-05: 50 ug via INTRAVENOUS
  Filled 2017-08-05: qty 2

## 2017-08-05 MED ORDER — ROCURONIUM BROMIDE 10 MG/ML (PF) SYRINGE
PREFILLED_SYRINGE | INTRAVENOUS | Status: AC
  Filled 2017-08-05: qty 5

## 2017-08-05 MED ORDER — LIDOCAINE 2% (20 MG/ML) 5 ML SYRINGE
INTRAMUSCULAR | Status: DC | PRN
  Administered 2017-08-05: 50 mg via INTRAVENOUS

## 2017-08-05 MED ORDER — FENTANYL 12 MCG/HR TD PT72: 12.5000 ug | MEDICATED_PATCH | TRANSDERMAL | 0 refills | Status: DC

## 2017-08-05 MED ORDER — MIDAZOLAM HCL 2 MG/2ML IJ SOLN
2.0000 mg | Freq: Once | INTRAMUSCULAR | Status: AC
  Administered 2017-08-05: 2 mg via INTRAVENOUS
  Filled 2017-08-05: qty 2

## 2017-08-05 SURGICAL SUPPLY — 89 items
BANDAGE ACE 4X5 VEL STRL LF (GAUZE/BANDAGES/DRESSINGS) ×3 IMPLANT
BANDAGE ACE 6X5 VEL STRL LF (GAUZE/BANDAGES/DRESSINGS) ×3 IMPLANT
BANDAGE ESMARK 6X9 LF (GAUZE/BANDAGES/DRESSINGS) ×1 IMPLANT
BIT DRILL 3.2 (BIT) ×2
BIT DRILL 3.2XCALB NS DISP (BIT) ×1 IMPLANT
BIT DRILL CALIBRATED 2.7 (BIT) ×2 IMPLANT
BIT DRILL CALIBRATED 2.7MM (BIT) ×1
BIT DRL 3.2XCALB NS DISP (BIT) ×1
BNDG COHESIVE 6X5 TAN STRL LF (GAUZE/BANDAGES/DRESSINGS) ×3 IMPLANT
BNDG ESMARK 6X9 LF (GAUZE/BANDAGES/DRESSINGS) ×3
BNDG GAUZE ELAST 4 BULKY (GAUZE/BANDAGES/DRESSINGS) ×6 IMPLANT
BRUSH SCRUB SURG 4.25 DISP (MISCELLANEOUS) ×6 IMPLANT
CLOSURE WOUND 1/2 X4 (GAUZE/BANDAGES/DRESSINGS)
CONT SPECI 4OZ STER CLIK (MISCELLANEOUS) ×3 IMPLANT
COVER SURGICAL LIGHT HANDLE (MISCELLANEOUS) ×6 IMPLANT
CUFF TOURNIQUET SINGLE 18IN (TOURNIQUET CUFF) IMPLANT
DRAPE C-ARM 42X72 X-RAY (DRAPES) ×3 IMPLANT
DRAPE C-ARMOR (DRAPES) ×3 IMPLANT
DRAPE IMP U-DRAPE 54X76 (DRAPES) ×3 IMPLANT
DRAPE ORTHO SPLIT 77X108 STRL (DRAPES) ×2
DRAPE SURG ORHT 6 SPLT 77X108 (DRAPES) ×1 IMPLANT
DRAPE U-SHAPE 47X51 STRL (DRAPES) ×6 IMPLANT
DRSG ADAPTIC 3X8 NADH LF (GAUZE/BANDAGES/DRESSINGS) ×3 IMPLANT
DRSG MEPILEX BORDER 4X12 (GAUZE/BANDAGES/DRESSINGS) ×3 IMPLANT
DRSG PAD ABDOMINAL 8X10 ST (GAUZE/BANDAGES/DRESSINGS) IMPLANT
ELECT REM PT RETURN 9FT ADLT (ELECTROSURGICAL) ×3
ELECTRODE REM PT RTRN 9FT ADLT (ELECTROSURGICAL) ×1 IMPLANT
GAUZE SPONGE 4X4 12PLY STRL (GAUZE/BANDAGES/DRESSINGS) ×6 IMPLANT
GLOVE BIO SURGEON STRL SZ7.5 (GLOVE) ×3 IMPLANT
GLOVE BIO SURGEON STRL SZ8 (GLOVE) ×3 IMPLANT
GLOVE BIOGEL PI IND STRL 7.5 (GLOVE) ×1 IMPLANT
GLOVE BIOGEL PI IND STRL 8 (GLOVE) ×1 IMPLANT
GLOVE BIOGEL PI INDICATOR 7.5 (GLOVE) ×2
GLOVE BIOGEL PI INDICATOR 8 (GLOVE) ×2
GOWN STRL REUS W/ TWL LRG LVL3 (GOWN DISPOSABLE) ×2 IMPLANT
GOWN STRL REUS W/ TWL XL LVL3 (GOWN DISPOSABLE) ×1 IMPLANT
GOWN STRL REUS W/TWL 2XL LVL3 (GOWN DISPOSABLE) ×3 IMPLANT
GOWN STRL REUS W/TWL LRG LVL3 (GOWN DISPOSABLE) ×4
GOWN STRL REUS W/TWL XL LVL3 (GOWN DISPOSABLE) ×2
K-WIRE 2X5 SS THRDED S3 (WIRE) ×6
KIT BASIN OR (CUSTOM PROCEDURE TRAY) ×3 IMPLANT
KIT INFUSE LRG II (Orthopedic Implant) ×6 IMPLANT
KIT ROOM TURNOVER OR (KITS) ×3 IMPLANT
KWIRE 2X5 SS THRDED S3 (WIRE) ×2 IMPLANT
LOOP VESSEL MAXI BLUE (MISCELLANEOUS) ×3 IMPLANT
MANIFOLD NEPTUNE II (INSTRUMENTS) ×3 IMPLANT
NEEDLE 22X1 1/2 (OR ONLY) (NEEDLE) IMPLANT
NS IRRIG 1000ML POUR BTL (IV SOLUTION) ×3 IMPLANT
PACK ORTHO EXTREMITY (CUSTOM PROCEDURE TRAY) ×3 IMPLANT
PACK UNIVERSAL I (CUSTOM PROCEDURE TRAY) ×3 IMPLANT
PAD ARMBOARD 7.5X6 YLW CONV (MISCELLANEOUS) ×6 IMPLANT
PADDING CAST COTTON 6X4 STRL (CAST SUPPLIES) ×9 IMPLANT
PEG LOCKING 3.2X36 (Screw) ×9 IMPLANT
PEG LOCKING 3.2X52 (Peg) ×3 IMPLANT
PLATE PROX HUM HI 11H 197 (Plate) ×3 IMPLANT
SCREW CORTICAL LOW PROF 3.5X34 (Screw) ×3 IMPLANT
SCREW LOCK CORT STAR 3.5X22 (Screw) ×6 IMPLANT
SCREW LP NL T15 3.5X22 (Screw) ×6 IMPLANT
SCREW LP NL T15 3.5X24 (Screw) ×3 IMPLANT
SCREW T15 LP CORT 3.5X42MM NS (Screw) ×6 IMPLANT
SCREW T15 MD 3.5X36MM NS (Screw) ×3 IMPLANT
SLING ARM FOAM STRAP XLG (SOFTGOODS) ×3 IMPLANT
SPONGE LAP 18X18 X RAY DECT (DISPOSABLE) ×6 IMPLANT
STAPLER VISISTAT 35W (STAPLE) ×3 IMPLANT
STRIP CLOSURE SKIN 1/2X4 (GAUZE/BANDAGES/DRESSINGS) IMPLANT
SUCTION FRAZIER HANDLE 10FR (MISCELLANEOUS) ×2
SUCTION FRAZIER TIP 10 FR DISP (SUCTIONS) ×3 IMPLANT
SUCTION TUBE FRAZIER 10FR DISP (MISCELLANEOUS) ×1 IMPLANT
SUT ETHIBOND 5 LR DA (SUTURE) IMPLANT
SUT ETHILON 3 0 PS 1 (SUTURE) ×6 IMPLANT
SUT FIBERWIRE #2 38 T-5 BLUE (SUTURE) ×3
SUT PDS AB 2-0 CT1 27 (SUTURE) IMPLANT
SUT VIC AB 0 CT1 27 (SUTURE) ×6
SUT VIC AB 0 CT1 27XBRD ANBCTR (SUTURE) ×3 IMPLANT
SUT VIC AB 2-0 CT1 27 (SUTURE) ×4
SUT VIC AB 2-0 CT1 TAPERPNT 27 (SUTURE) ×2 IMPLANT
SUT VIC AB 2-0 CT3 27 (SUTURE) IMPLANT
SUTURE FIBERWR #2 38 T-5 BLUE (SUTURE) ×1 IMPLANT
SWAB CULTURE LIQ STUART DBL (MISCELLANEOUS) ×3 IMPLANT
SYR CONTROL 10ML LL (SYRINGE) IMPLANT
TOWEL OR 17X24 6PK STRL BLUE (TOWEL DISPOSABLE) ×6 IMPLANT
TOWEL OR 17X26 10 PK STRL BLUE (TOWEL DISPOSABLE) ×6 IMPLANT
TRAY FOLEY CATH SILVER 16FR LF (SET/KITS/TRAYS/PACK) ×3 IMPLANT
TRAY FOLEY W/METER SILVER 16FR (SET/KITS/TRAYS/PACK) IMPLANT
TUBE ANAEROBIC SPECIMEN COL (MISCELLANEOUS) ×3 IMPLANT
TUBE CONNECTING 12'X1/4 (SUCTIONS) ×1
TUBE CONNECTING 12X1/4 (SUCTIONS) ×2 IMPLANT
UNDERPAD 30X30 (UNDERPADS AND DIAPERS) ×3 IMPLANT
YANKAUER SUCT BULB TIP NO VENT (SUCTIONS) ×3 IMPLANT

## 2017-08-05 NOTE — Anesthesia Procedure Notes (Signed)
Procedure Name: Intubation Date/Time: 08/05/2017 12:18 PM Performed by: Freddie Breech, CRNA Pre-anesthesia Checklist: Patient identified, Emergency Drugs available, Suction available and Patient being monitored Patient Re-evaluated:Patient Re-evaluated prior to induction Oxygen Delivery Method: Circle system utilized Preoxygenation: Pre-oxygenation with 100% oxygen Induction Type: IV induction Ventilation: Mask ventilation without difficulty Laryngoscope Size: Mac and 3 Grade View: Grade I Tube type: Oral Tube size: 7.0 mm Number of attempts: 1 Airway Equipment and Method: Stylet Placement Confirmation: ETT inserted through vocal cords under direct vision,  positive ETCO2 and breath sounds checked- equal and bilateral Secured at: 21 cm Tube secured with: Tape Dental Injury: Teeth and Oropharynx as per pre-operative assessment

## 2017-08-05 NOTE — Progress Notes (Signed)
Pt up in recliner. Using IS well up to 760cc,strong,non-prod. Cough, VSS,O2 sat 91-93% on RA, CXR OK. Dr Marcie Bal fully updated, OK to tx to Phase 2 & DC to home.

## 2017-08-05 NOTE — Discharge Instructions (Addendum)
Orthopaedic Trauma Service Discharge Instructions   General Discharge Instructions  WEIGHT BEARING STATUS: Nonweightbearing Left upper extremity   RANGE OF MOTION/ACTIVITY: gentle shoulder range of motion as tolerated. Unrestricted range of motion left elbow, forearm, wrist and hand.  No lifting greater than 5 lbs with Left arm  Sling for comfort   Wound Care: daily wound care starting on 08/07/2017. See below   Discharge Wound Care Instructions  Do NOT apply any ointments, solutions or lotions to pin sites or surgical wounds.  These prevent needed drainage and even though solutions like hydrogen peroxide kill bacteria, they also damage cells lining the pin sites that help fight infection.  Applying lotions or ointments can keep the wounds moist and can cause them to breakdown and open up as well. This can increase the risk for infection. When in doubt call the office.  Surgical incisions should be dressed daily.  If any drainage is noted, use one layer of adaptic, then gauze, Kerlix, and an ace wrap.  Once the incision is completely dry and without drainage, it may be left open to air out.  Showering may begin 36-48 hours later.  Cleaning gently with soap and water.  Traumatic wounds should be dressed daily as well.    One layer of adaptic, gauze, Kerlix, then ace wrap.  The adaptic can be discontinued once the draining has ceased    If you have a wet to dry dressing: wet the gauze with saline the squeeze as much saline out so the gauze is moist (not soaking wet), place moistened gauze over wound, then place a dry gauze over the moist one, followed by Kerlix wrap, then ace wrap.   Diet: as you were eating previously.  Can use over the counter stool softeners and bowel preparations, such as Miralax, to help with bowel movements.  Narcotics can be constipating.  Be sure to drink plenty of fluids  PAIN MEDICATION USE AND EXPECTATIONS  You have likely been given narcotic medications to  help control your pain.  After a traumatic event that results in an fracture (broken bone) with or without surgery, it is ok to use narcotic pain medications to help control one's pain.  We understand that everyone responds to pain differently and each individual patient will be evaluated on a regular basis for the continued need for narcotic medications. Ideally, narcotic medication use should last no more than 6-8 weeks (coinciding with fracture healing).   As a patient it is your responsibility as well to monitor narcotic medication use and report the amount and frequency you use these medications when you come to your office visit.   We would also advise that if you are using narcotic medications, you should take a dose prior to therapy to maximize you participation.  IF YOU ARE ON NARCOTIC MEDICATIONS IT IS NOT PERMISSIBLE TO OPERATE A MOTOR VEHICLE (MOTORCYCLE/CAR/TRUCK/MOPED) OR HEAVY MACHINERY DO NOT MIX NARCOTICS WITH OTHER CNS (CENTRAL NERVOUS SYSTEM) DEPRESSANTS SUCH AS ALCOHOL   STOP SMOKING OR USING NICOTINE PRODUCTS!!!!  As discussed nicotine severely impairs your body's ability to heal surgical and traumatic wounds but also impairs bone healing.  Wounds and bone heal by forming microscopic blood vessels (angiogenesis) and nicotine is a vasoconstrictor (essentially, shrinks blood vessels).  Therefore, if vasoconstriction occurs to these microscopic blood vessels they essentially disappear and are unable to deliver necessary nutrients to the healing tissue.  This is one modifiable factor that you can do to dramatically increase your chances of healing your  injury.    (This means no smoking, no nicotine gum, patches, etc)  DO NOT USE NONSTEROIDAL ANTI-INFLAMMATORY DRUGS (NSAID'S)  Using products such as Advil (ibuprofen), Aleve (naproxen), Motrin (ibuprofen) for additional pain control during fracture healing can delay and/or prevent the healing response.  If you would like to take over the  counter (OTC) medication, Tylenol (acetaminophen) is ok.  However, some narcotic medications that are given for pain control contain acetaminophen as well. Therefore, you should not exceed more than 4000 mg of tylenol in a day if you do not have liver disease.  Also note that there are may OTC medicines, such as cold medicines and allergy medicines that my contain tylenol as well.  If you have any questions about medications and/or interactions please ask your doctor/PA or your pharmacist.      ICE AND ELEVATE INJURED/OPERATIVE EXTREMITY  Using ice and elevating the injured extremity above your heart can help with swelling and pain control.  Icing in a pulsatile fashion, such as 20 minutes on and 20 minutes off, can be followed.    Do not place ice directly on skin. Make sure there is a barrier between to skin and the ice pack.    Using frozen items such as frozen peas works well as the conform nicely to the are that needs to be iced.  USE AN ACE WRAP OR TED HOSE FOR SWELLING CONTROL  In addition to icing and elevation, Ace wraps or TED hose are used to help limit and resolve swelling.  It is recommended to use Ace wraps or TED hose until you are informed to stop.    When using Ace Wraps start the wrapping distally (farthest away from the body) and wrap proximally (closer to the body)   Example: If you had surgery on your leg or thing and you do not have a splint on, start the ace wrap at the toes and work your way up to the thigh        If you had surgery on your upper extremity and do not have a splint on, start the ace wrap at your fingers and work your way up to the upper arm  IF YOU ARE IN A SPLINT OR CAST DO NOT [place]   If your splint gets wet for any reason please contact the office immediately. You may shower in your splint or cast as long as you keep it dry.  This can be done by wrapping in a cast cover or garbage back (or similar)  Do Not stick any thing down your splint  or cast such as pencils, money, or hangers to try and scratch yourself with.  If you feel itchy take benadryl as prescribed on the bottle for itching  IF YOU ARE IN A CAM BOOT (BLACK BOOT)  You may remove boot periodically. Perform daily dressing changes as noted below.  Wash the liner of the boot regularly and wear a sock when wearing the boot. It is recommended that you sleep in the boot until told otherwise  CALL THE OFFICE WITH ANY QUESTIONS OR CONCERNS: [phone]

## 2017-08-05 NOTE — Anesthesia Procedure Notes (Addendum)
Central Venous Catheter Insertion Performed by: Roberts Gaudy, MD, anesthesiologist Start/End1/22/2019 12:20 PM, 08/05/2017 12:25 PM Patient location: OR. Preanesthetic checklist: patient identified, IV checked, site marked, risks and benefits discussed, surgical consent, monitors and equipment checked, pre-op evaluation, timeout performed and anesthesia consent Lidocaine 1% used for infiltration and patient sedated Hand hygiene performed  and maximum sterile barriers used  Catheter size: 8 Fr Total catheter length 16. Central line was placed.Double lumen Procedure performed using ultrasound guided technique. Ultrasound Notes:image(s) printed for medical record Attempts: 1 Following insertion, dressing applied and line sutured. Post procedure assessment: blood return through all ports  Patient tolerated the procedure well with no immediate complications.

## 2017-08-05 NOTE — Anesthesia Preprocedure Evaluation (Addendum)
Anesthesia Evaluation  Patient identified by MRN, date of birth, ID band Patient awake    Reviewed: Allergy & Precautions, NPO status , Patient's Chart, lab work & pertinent test results, reviewed documented beta blocker date and time   History of Anesthesia Complications (+) PONV, POST - OP SPINAL HEADACHE, Family history of anesthesia reaction and history of anesthetic complications  Airway Mallampati: II  TM Distance: >3 FB Neck ROM: Full    Dental  (+) Chipped,    Pulmonary asthma , COPD,  COPD inhaler, former smoker,    breath sounds clear to auscultation       Cardiovascular hypertension, Pt. on medications and Pt. on home beta blockers + CAD   Rhythm:Regular     Neuro/Psych  Headaches, PSYCHIATRIC DISORDERS Anxiety Depression  Neuromuscular disease CVA    GI/Hepatic Neg liver ROS, GERD  ,  Endo/Other  Hypothyroidism   Renal/GU negative Renal ROS     Musculoskeletal  (+) Arthritis , Fibromyalgia -  Abdominal (+) + obese,   Peds negative pediatric ROS (+)  Hematology  (+) anemia ,   Anesthesia Other Findings Obese. Wears a fentanyl patch, but not today. Anemic. EKG LVH. Stress test 2009 OK. Allergic to Latex, dilaudid and Morphine and demerol. Will give only fentanyl in PACU. She denies a stroke, but probably had a TIA in 11/2005. 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  Reproductive/Obstetrics                             Anesthesia Physical Anesthesia Plan  ASA: III  Anesthesia Plan: General and Regional   Post-op Pain Management:  Regional for Post-op pain   Induction:   PONV Risk Score and Plan: Ondansetron and Dexamethasone  Airway Management Planned: Oral ETT  Additional Equipment: None  Intra-op Plan:   Post-operative Plan: Extubation in OR  Informed Consent: I have reviewed the patients History and Physical, chart, labs and discussed the procedure including the risks, benefits  and alternatives for the proposed anesthesia with the patient or authorized representative who has indicated his/her understanding and acceptance.   Dental advisory given  Plan Discussed with: CRNA and Surgeon  Anesthesia Plan Comments:         Anesthesia Quick Evaluation

## 2017-08-05 NOTE — Transfer of Care (Signed)
Immediate Anesthesia Transfer of Care Note  Patient: Rachel Shaffer  Procedure(s) Performed: OPEN REDUCTION INTERNAL FIXATION (ORIF) LEFT PROXIMAL HUMERUS (Left ) HARDWARE REMOVAL (Left )  Patient Location: PACU  Anesthesia Type:General  Level of Consciousness: awake, alert  and oriented  Airway & Oxygen Therapy: Patient Spontanous Breathing and Patient connected to face mask oxygen  Post-op Assessment: Report given to RN and Post -op Vital signs reviewed and stable  Post vital signs: Reviewed and stable  Last Vitals:  Vitals:   08/05/17 1110 08/05/17 1115  BP: 137/80 (!) 195/89  Pulse: 61 63  Resp: 14 16  Temp:    SpO2: 99% 99%    Last Pain:  Vitals:   08/05/17 0936  TempSrc:   PainSc: 7       Patients Stated Pain Goal: 5 (07/16/70 5366)  Complications: No apparent anesthesia complications

## 2017-08-05 NOTE — H&P (Signed)
Orthopaedic Trauma Service (OTS) Consult/H&P   Patient ID: Rachel Shaffer MRN: [id number] DOB/AGE: [birth year] 69 y.o.  PCP: Georgie Chard, MD Cardiologist: Pura Spice   HPI: Rachel Shaffer is an 68 y.o. RHD female who presents today for repair of a left humerus nonunion.  The patient was referred from [facility] to orthopedic trauma specialist for evaluation of a left humerus nonunion.  She was seen in consult on 07/21/2017 in the office.  Patient again is a right-hand-dominant female who sustained an injury to her left arm and humerus January 2017.  Patient was recovering from a right total knee replacement that was done in 2016 when she had fallen.  She was on a treadmill when she fell hitting her left arm on the treadmill.  Patient was seen and evaluated at [facility].  She subsequently followed up with an orthopedist who felt that her injury needed to be referred elsewhere.  She subsequently followed up with Dr. Roland Rack in [place] in about 4 weeks later she had ORIF of her left proximal humerus.  In April 2017 she continued having significant pain in her arm.  X-rays were obtained which demonstrated a nonunion of her proximal humerus fracture.  She was taken back to the operating room on 11/12/2015 for revision ORIF.  After this procedure patient was doing quite well and improving in some respects however she had an acute exacerbation of her pain in August 2018.  Subsequent follow-up and x-rays were notable for fracture below her implant in her left humeral shaft.  Subsequent CT scan obtained at her last visit on 07/11/2017 confirmed a nonunion of her proximal humerus as well as her proximal third humeral shaft.  Patient was referred to orthopedic trauma specialist for treatment of her nonunions  Patient remains on Duragesic patch 12.5 mcg every third day and supplements with plain oxycodone as she is intolerant to nearly all pain medicines.  Patient is tolerant to fentanyl and  oxycodone.   Patient denies any numbness or tingling in her left upper extremity.  Her injury does severely impact her ability to perform ADLs.  She is unable to wash her hair and she relies on her husband to facilitate bathing and cleaning.  Past medical history    Osteoporosis, fibromyalgia, Sjogren's which is managed by PCP. Aortic regurgitation followed by Dr. Pura Spice at [facility]     Hypertension Frequent yeast infections in her pannus History of esophageal stricture  Patient's last DEXA scan was a year ago, she is on raloxifene for at least 4 years  Medications Patient is on a plethora of medications, see below Nearly intolerant of all pain medications.  Does use fentanyl and oxycodone but needs to use hydroxyzine to mitigate her itching  Notable medications with respect to the orthopedic standpoint include raloxifene, vitamin D, Celexa, ibuprofen, Pepcid  Past surgical history See below  Social history Patient does not smoke, does not drink.  She is a former smoker.  She is retired.  Lives at home with her husband.  She is married with 2 children.  No other substance abuse issues. Her opiate use is only related to these acute humerus fractures.  No opiates prior to her injuries.  Family history Patient's children have neurofibromatosis which was confirmed to be on her ex husband side. Sister has scoliosis and has had delayed healing of numerous fractures in the past Patient reports a strong history of thyroid problems in her family Patient nor her family has a history of  diabetes  Past Medical History:  Diagnosis Date  . Anemia   . Anxiety   . Aortic regurgitation   . Arthritis   . Asthma   . Blood transfusion without reported diagnosis   . Bronchitis, chronic ([place])   . Cataract   . Complication of anesthesia    spinal headache  blood patch.  "Blood pressure drops"  . COPD (chronic obstructive pulmonary disease) ([place])    "mild"  . Coronary artery disease    . DDD (degenerative disc disease), cervical    all of back  . Depression   . DJD (degenerative joint disease)   . Dyspnea    with exertion  . Family history of adverse reaction to anesthesia    sister scoliosis and trouble with some anesthesia- hallunication  . Fibromyalgia   . Folic acid deficiency   . GERD (gastroesophageal reflux disease)   . History of kidney stones    passed  . Hyperlipidemia   . Hypertension   . Hypothyroidism    not on medication  . Osteoporosis   . Pain    non cardiac chest pain  . PONV (postoperative nausea and vomiting)   . Seizures ([place])    prior to brain surgery  . Sjogren's disease ([place])   . Spinal headache   . 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 Vasculitis ([place])   . Vitamin D deficiency     Past Surgical History:  Procedure Laterality Date  . ABDOMINAL HYSTERECTOMY    . APPENDECTOMY    . 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 BREAST BIOPSY Left 1992   negative  . BREAST LUMPECTOMY Left 1992  . CHOLECYSTECTOMY    . COLONOSCOPY    . CYSTOSCOPY     urtheral polyps  . ESOPHAGOGASTRODUODENOSCOPY    . ESOPHAGOGASTRODUODENOSCOPY N/A 05/15/2015   Procedure: ESOPHAGOGASTRODUODENOSCOPY (EGD);  Surgeon: Hulen Luster, MD;  Location: [facility] ENDOSCOPY;  Service: Endoscopy;  Laterality: N/A;  . HARDWARE REMOVAL Left 11/14/2016   Procedure: HARDWARE REMOVAL- SHOULDER;  Surgeon: Corky Mull, MD;  Location: [facility] ORS;  Service: Orthopedics;  Laterality: Left;  . KNEE ARTHROSCOPY Left   . OOPHORECTOMY Bilateral   . ORIF HUMERUS FRACTURE Left 08/15/2015   Procedure: OPEN REDUCTION INTERNAL FIXATION (ORIF) PROXIMAL HUMERUS FRACTURE;  Surgeon: Corky Mull, MD;  Location: [facility] ORS;  Service: Orthopedics;  Laterality: Left;  . ORIF HUMERUS FRACTURE Left 11/14/2016   Procedure: OPEN REDUCTION INTERNAL FIXATION (ORIF) PROXIMAL HUMERUS FRACTURE;  Surgeon: Corky Mull, MD;  Location: [facility] ORS;  Service: Orthopedics;  Laterality: Left;  . STOMACH SURGERY      gastroplasty  . TONSILLECTOMY  1959  . TOTAL KNEE ARTHROPLASTY Right 04/26/2015   Procedure: TOTAL KNEE ARTHROPLASTY;  Surgeon: Leanor Kail, MD;  Location: [facility] ORS;  Service: Orthopedics;  Laterality: Right;    Family History  Problem Relation Age of Onset  . Arthritis Mother   . Vision loss Mother   . Heart disease Mother   . Hypertension Mother   . Hyperlipidemia Mother   . Kidney disease Mother   . Stroke Father   . Asthma Father   . COPD Father   . Heart disease Father   . Hypertension Father   . Hyperlipidemia Father   . Kidney disease Father   . Breast cancer Maternal Aunt 60    Social History:  reports that she quit smoking about 42 years ago. Her smoking use included cigarettes. She quit after 1.00  year of use. she has never used smokeless tobacco. She reports that she does not drink alcohol or use drugs.  Allergies:  Allergies  Allergen Reactions  . Ceftin [Cefuroxime] Anaphylaxis  . Nucynta [Tapentadol] Anaphylaxis  . Other Anaphylaxis, Swelling and Other (See Comments)    Pt states that she is allergic to kiwi, pineapple, pecans, walnuts and all tree nuts.     . Budesonide-Formoterol Fumarate Hives  . Cadmium Other (See Comments)    Reaction:  Unknown   . Cephalexin Itching and Swelling  . Codeine Other (See Comments)    Reaction:  Unknown   . Contrast Media [Iodinated Diagnostic Agents] Hives  . Demerol [Meperidine] Other (See Comments)    Pt unsure  . Depakote [Divalproex Sodium] Other (See Comments)    Reaction:  Unknown   . Dilaudid [Hydromorphone Hcl] Other (See Comments)    Reaction:  Hallucinations   . Feldene [Piroxicam] Other (See Comments)    Reaction:  Unknown   . Fosamax [Alendronate] Nausea And Vomiting  . Imitrex [Sumatriptan] Other (See Comments)    Reaction:  Increased heart rate   . Latex Itching and Other (See Comments)    redness  . Methotrexate Derivatives Other (See Comments)    Reaction:  GI bleeding   . Morphine And  Related Nausea And Vomiting  . Nexium [Esomeprazole Magnesium] Other (See Comments)    Reaction:  Unknown   . Nsaids Itching and Other (See Comments)    Can take Ibuprofen  . Percocet [Oxycodone-Acetaminophen] Itching  . Protonix [Pantoprazole Sodium] Other (See Comments)    Unknown  . Relafen [Nabumetone] Other (See Comments)    Reaction:  Unknown   . Tegretol [Carbamazepine] Other (See Comments)    Reaction:  Aplastic anemia  . Topamax [Topiramate] Other (See Comments)    Reaction:  Unknown   . Tramadol Other (See Comments)    Headache  . Trazodone And Nefazodone Other (See Comments)    Reaction:  Unknown   . Vicodin [Hydrocodone-Acetaminophen] Itching  . Wellbutrin [Bupropion] Other (See Comments)    Reaction:  Unknown     Medications:  Current Meds  Medication Sig  . albuterol (PROVENTIL HFA;VENTOLIN HFA) 108 (90 Base) MCG/ACT inhaler Inhale 2 puffs into the lungs every 4 (four) hours as needed for wheezing or shortness of breath.   Marland Kitchen amLODipine (NORVASC) 10 MG tablet Take 10 mg by mouth at bedtime.   . Artificial Tear Ointment (DRY EYES OP) Place 2 drops into both eyes 4 (four) times daily as needed (for dry eyes).  . benzonatate (TESSALON) 200 MG capsule Take 200 mg by mouth 3 (three) times daily as needed for cough.  . Biotin 5000 MCG CAPS Take 5,000 mcg by mouth daily.   . butalbital-acetaminophen-caffeine (FIORICET, ESGIC) 50-325-40 MG tablet Take 2 tablets by mouth every 6 (six) hours as needed for migraine.   . citalopram (CELEXA) 20 MG tablet Take 20 mg by mouth 2 (two) times daily.  . diphenoxylate-atropine (LOMOTIL) 2.5-0.025 MG tablet Take 1 tablet by mouth every 6 (six) hours as needed for diarrhea or loose stools.   Marland Kitchen EVENING PRIMROSE OIL PO Take 1,300 mg by mouth at bedtime.  . famotidine (PEPCID) 20 MG tablet Take 1 tablet (20 mg total) by mouth 2 (two) times daily.  . fentaNYL (DURAGESIC - DOSED MCG/HR) 12 MCG/HR Place 12.5 mcg onto the skin every 3 (three)  days.  . ferrous sulfate 325 (65 FE) MG tablet Take 325 mg by mouth every Friday.  Marland Kitchen  Fluticasone-Salmeterol (ADVAIR) 250-50 MCG/DOSE AEPB Inhale 1 puff into the lungs 2 (two) times daily as needed (for shortness of breath or wheezing).   . folic acid (FOLVITE) 1 MG tablet Take 1 mg by mouth daily.  . Ginkgo Biloba (GNP GINGKO BILOBA EXTRACT PO) Take 40 mg by mouth daily.  . hydrOXYzine (ATARAX/VISTARIL) 25 MG tablet Take 25 mg by mouth 4 (four) times daily as needed for itching.   Marland Kitchen ibuprofen (ADVIL,MOTRIN) 800 MG tablet Take 800 mg by mouth See admin instructions. Take 800 mg by mouth twice daily. May take additional 800 mg if needed for pain  . ipratropium-albuterol (DUONEB) 0.5-2.5 (3) MG/3ML SOLN Take 3 mLs by nebulization every 6 (six) hours as needed (for shortness of breath or wheezing).  . lansoprazole (PREVACID) 30 MG capsule Take 30 mg by mouth 2 (two) times daily.  Marland Kitchen lisinopril (PRINIVIL,ZESTRIL) 20 MG tablet Take 20 mg by mouth 2 (two) times daily.   Marland Kitchen LORazepam (ATIVAN) 1 MG tablet Take 4 mg by mouth at bedtime.   . magnesium oxide (MAG-OX) 400 MG tablet Take 400 mg by mouth at bedtime.   . Melatonin 5 MG TABS Take 5 mg by mouth at bedtime.   . Menthol, Topical Analgesic, (BIOFREEZE EX) Apply 1 application topically 3 (three) times daily as needed (for pain).  . metaxalone (SKELAXIN) 400 MG tablet Take 1 tablet (400 mg total) by mouth 3 (three) times daily. (Patient taking differently: Take 800 mg by mouth See admin instructions. Take 800 mg by mouth twice daily. May take additional 400 mg if needed for pain)  . nystatin (NYSTATIN) powder Apply 1 g topically daily.   Marland Kitchen oxyCODONE (OXY IR/ROXICODONE) 5 MG immediate release tablet Take 1-2 tablets (5-10 mg total) by mouth every 4 (four) hours as needed for breakthrough pain.  . Probiotic Product (PROBIOTIC DAILY PO) Take 1 capsule by mouth daily. 30 billion  . promethazine (PHENERGAN) 25 MG tablet Take 25 mg by mouth every 6 (six) hours  as needed for nausea or vomiting.   . propranolol ER (INDERAL LA) 60 MG 24 hr capsule Take 60 mg by mouth at bedtime.   . pyridoxine (B-6) 100 MG tablet Take 100 mg by mouth daily.  . Riboflavin (VITAMIN B-2 PO) Take 400 mg by mouth daily.  . simvastatin (ZOCOR) 40 MG tablet Take 40 mg by mouth at bedtime.   . vitamin B-12 (CYANOCOBALAMIN) 500 MCG tablet Take 500 mcg by mouth every morning.  . Vitamin D, Ergocalciferol, (DRISDOL) 50000 units CAPS capsule Take 50,000 Units by mouth every Saturday.      No results found for this or any previous visit (from the past 48 hour(s)).  Dg Chest Portable 1 View  Result Date: 08/05/2017 CLINICAL DATA:  Preop left humerus fracture EXAM: PORTABLE CHEST 1 VIEW COMPARISON:  None. FINDINGS: Mild cardiomegaly. No confluent airspace opacities or effusions. Hardware visualized in the proximal left humerus. IMPRESSION: Cardiomegaly.  No active disease. Electronically Signed   By: Rolm Baptise M.D.   On: 08/05/2017 09:28    Review of Systems  Constitutional: Negative for chills and fever.  Respiratory: Negative for shortness of breath and wheezing.   Cardiovascular: Negative for chest pain and palpitations.  Gastrointestinal: Negative for nausea and vomiting.  Neurological: Negative for tingling and sensory change.   Blood pressure (!) 169/75, pulse 70, temperature 98.7 F (37.1 C), temperature source Oral, resp. rate 18, height 5\' 2"  (1.575 m), weight 108.9 kg (240 lb), SpO2 97 %. Physical  Exam  Constitutional: She is oriented to person, place, and time. She is cooperative. No distress.  Very pleasant 67 year old white female 239 pounds, 5 foot 2 inches tall Appears appropriate for stated age  Cardiovascular:  RRR S1 and S2 + murmur   Pulmonary/Chest:  Breathing is unlabored, clear to auscultation throughout  Abdominal:  Obese, soft, nontender, nondistended Large pannus  Musculoskeletal:  Left upper extremity Surgical wounds left shoulder  well-healed. Exquisite tenderness palpation proximal humerus She does tolerate gentle forward flexion abduction extension of her shoulder.  Tolerates full motion of her elbow, forearm, wrist and hand. Radial, ulnar, median and sensory function are intact Axillary no sensory functions are intact Radial, ulnar, median, AIN, PIN motor functions are intact Axillary nerve motor functions are intact No significant swelling distally Extremities warm, palpable radial pulses noted   Neurological: She is alert and oriented to person, place, and time.  Skin: Skin is warm and intact. Capillary refill takes less than 2 seconds.  Psychiatric: She has a normal mood and affect. Her speech is normal. Cognition and memory are normal.     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  Her labs were pretty unremarkable and without any immediate identifiable cause for her persistent nonunions  With that said we feel that her raloxifene is a major contributor to her failure to consolidate  She will discontinue this medication indefinitely and will likely need to consider new agent once her fractures are healed    Patient will be taken to the OR today for repair of her left humerus nonunions, hardware exchange  We will obtain intraoperative cultures although her inflammatory markers were normal.  Hold antibiotics until instructed to give   We did discuss bone grafting options with the patient particularly the autograft as this is been a prolonged healing process for her.  We did consider iliac crest bone graft but given the recurrent yeast infections we feel that this is too risky.  We could also consider  reamed intramedullary  aspirate from her left femur but given her overall clinical picture we are concerned with iatrogenic femur fracture.  We will likely utilize BMP product along with cancellous chips   Patient is also apparently intolerant to the low intensity pulsed ultrasound bone stimulator.  She had an exogen previously which irritated her skin  - Pain management:  Fentanyl and oxycodone   - Metabolic Bone Disease:  Outpatient workup was really unremarkable.  Feel that the raloxifene is the major contributor to persistent nonunion  - Impediments to fracture healing:  As above  - Dispo:  OR for repair of left proximal humerus and humeral shaft nonunion   Jari Pigg, PA-C [facility] [phone] (C) [phone] (O) 08/05/2017, 9:38 AM

## 2017-08-05 NOTE — Anesthesia Procedure Notes (Signed)
Anesthesia Regional Block: Interscalene brachial plexus block   Pre-Anesthetic Checklist: ,, timeout performed, Correct Patient, Correct Site, Correct Laterality, Correct Procedure, Correct Position, site marked, Risks and benefits discussed,  Surgical consent,  Pre-op evaluation,  At surgeon's request and post-op pain management  Laterality: Upper and Left  Prep: chloraprep       Needles:  Injection technique: Single-shot  Needle Type: Echogenic Stimulator Needle          Additional Needles:   Procedures:,,,, ultrasound used (permanent image in chart),,,,  Narrative:  Start time: 08/05/2017 10:54 AM End time: 08/05/2017 11:01 AM Injection made incrementally with aspirations every 5 mL.  Performed by: Personally  Anesthesiologist: Oleta Mouse, MD  Additional Notes: H+P and labs reviewed, risks and benefits discussed with patient, procedure tolerated well without complications

## 2017-08-06 LAB — URINE CULTURE: Culture: NO GROWTH

## 2017-08-06 NOTE — Anesthesia Postprocedure Evaluation (Signed)
Anesthesia Post Note  Patient: Rachel Shaffer  Procedure(Shaffer) Performed: OPEN REDUCTION INTERNAL FIXATION (ORIF) LEFT PROXIMAL HUMERUS (Left ) HARDWARE REMOVAL (Left )     Patient location during evaluation: PACU Anesthesia Type: Regional Level of consciousness: awake and alert Pain management: pain level controlled Vital Signs Assessment: post-procedure vital signs reviewed and stable Respiratory status: spontaneous breathing, nonlabored ventilation, respiratory function stable and patient connected to nasal cannula oxygen Cardiovascular status: blood pressure returned to baseline and stable Postop Assessment: no apparent nausea or vomiting Anesthetic complications: no    Last Vitals:  Vitals:   08/05/17 1743 08/05/17 1745  BP:    Pulse: 77   Resp: 17   Temp:  (!) 36.1 C  SpO2: 94%     Last Pain:  Vitals:   08/05/17 1745  TempSrc:   PainSc: 0-No pain                 Rachel Shaffer

## 2017-08-07 ENCOUNTER — Encounter (HOSPITAL_COMMUNITY): Payer: Self-pay | Admitting: Orthopedic Surgery

## 2017-08-10 LAB — AEROBIC/ANAEROBIC CULTURE (SURGICAL/DEEP WOUND)
CULTURE: NO GROWTH
GRAM STAIN: NONE SEEN

## 2017-08-10 LAB — AEROBIC/ANAEROBIC CULTURE W GRAM STAIN (SURGICAL/DEEP WOUND): Culture: NO GROWTH

## 2017-08-14 NOTE — Brief Op Note (Signed)
OP Note Dictation [id number]

## 2017-08-15 NOTE — Op Note (Signed)
NAME:  Rachel Shaffer, Rachel Shaffer                       ACCOUNT NO.:  MEDICAL RECORD NO.:  [id number]  LOCATION:                                 FACILITY:  PHYSICIAN:  Astrid Divine. Marcelino Scot, M.D. DATE OF BIRTH:  June 24, 1951  DATE OF PROCEDURE:  08/05/2017 DATE OF DISCHARGE:                              OPERATIVE REPORT   PREOPERATIVE DIAGNOSES: 1. Left proximal humerus surgical neck nonunion. 2. Left humeral shaft nonunion. 3. Loose hardware.  POSTOPERATIVE DIAGNOSES: 1. Left proximal humerus surgical neck nonunion. 2. Left humeral shaft nonunion. 3. Loose hardware.  PROCEDURES: 1. Repair of surgical neck nonunion. 2. Repair of humeral shaft nonunion. 3. Removal of broken and retained hardware.  SURGEON:  Astrid Divine. Marcelino Scot, MD.  ASSISTANTS: 1. Ainsley Spinner, PAC. 2. PA student.  ANESTHESIA:  General supplemented with regional block.  COMPLICATIONS:  None.  DISPOSITION:  To PACU.  CONDITION:  Stable.  BRIEF SUMMARY OF INDICATION FOR PROCEDURE:  Rachel Shaffer is a pleasant 67- year-old right-hand dominant female with a surgical neck fracture of the left humerus, treated by ORIF with Dr. Elenore Rota.  The patient subsequently developed pain below her hardware and further followup demonstrated fracture.  This was treated nonsurgically, but failed to unite, leaving her with a failure of consolidation of her proximal humerus as well as a new nonunion below her plate.  Because of the complexity of this situation as well as the patient's underlying medical comorbidities, Dr. Merry Proud Poggi in [place] asserted this was outside of his scope of practice and these injuries will be best managed by fellowship trained orthopedic traumatologist.  I consulted with the patient and recommended internal fixation with a new hardware if feasible to span both fractures.  The patient and I discussed the risks of heart attack, stroke, nerve injury, vessel injury, yeast infection from perioperative antibiotics, DVT,  PE, heart attack, stroke, and other others including persistent nonunion or new fracture below the new plate.  We did do an extensive metabolic bone workup with little findings other than the raloxifene, which would be working against her healing and also the autoimmune Sjogren's and fibromyalgia, some of the medications for which can also be deleterious.  BRIEF SUMMARY OF PROCEDURE:  The patient was taken to the operating room where general anesthesia was induced.  She was positioned supine with a small bump under the left shoulder.  The C-arm was brought in to confirm visualization of all fracture components.  A chlorhexidine wash and then Betadine scrub and paint were performed.  The old incision was remade proximally and then extended distally down the arm.  The self-retaining retractor was placed.  The deltopectoral interval was identified and explored proximally and then distally.  The biceps was retracted medially and the brachialis split midline.  I did encounter a pseudarthrosis below the plate with clear fluid and no evidence of infection.  After evacuation of this, I continued with exploration of the plate proximally.  I was able to identify all screws and removed them.  There was 2 broken pegs.  Fortunately, I was able to grasp each of these and removed them without additional significant dissection. Following  this, I was able to explore the proximal humerus nonunion site and used a curette and lavage to remove the fibrous material here.  This was then grafted aggressively with cancellous chips and Infuse allograft.  Distally, we turned our attention to the humeral shaft nonunion where this was curetted as well and the cavity explored.  We used the curette to get back to healthy bleeding bone and then placed graft and cancellous allograft and Infuse sponge as well anteriorly, posteriorly, and specifically medially in addition to laterally.  With all 4 cortices covered, we  then performed reduction.  My assistant, who had helped to retract and expose the hardware and also to expose and develop the nonunion sites at the surgical neck and then down the shaft, maintained reduction while a plate was provisionally fixed with K-wire fixation.  This was checked under fluoro on 2 views and then secured into the head with standard screws and then pegs followed by standard screw in the middle segment to control angulation and then distally again standard fixation followed by locked fixation.  Indeed, we were able to achieve proper alignment, perhaps even very slight valgus which was our intent along with excellent fixation of each segment maintaining biomechanical what sounded was at the surgical neck fracture and at the shaft fracture.  Wound was irrigated thoroughly and closed in a standard layered fashion after obtaining final images including live rotation of the humeral head to make sure all hardware was below and outside the joint surface.  Again, Ainsley Spinner, PA-C, assisted me throughout.  We also had additional assistant PA student.  PROGNOSIS:  Rachel Shaffer will be nonweightbearing to the left upper extremity.  She will be in a sling for comfort with ice and active motion of the hand, wrist, and elbow.  We will plan to see her back for possible removal of sutures in 10 to 14 days.  She is at increased risk for new fracture as well as persistent nonunion given her history.     Astrid Divine. Marcelino Scot, M.D.     MHH/MEDQ  D:  08/14/2017  T:  08/14/2017  Job:  [id number]

## 2017-08-18 ENCOUNTER — Encounter (HOSPITAL_COMMUNITY): Payer: Self-pay | Admitting: Orthopedic Surgery

## 2017-09-03 ENCOUNTER — Other Ambulatory Visit
Admission: RE | Admit: 2017-09-03 | Discharge: 2017-09-03 | Disposition: A | Discharge status: Home / Self Care | Payer: Medicare Other | Source: Ambulatory Visit | Attending: Orthopedic Surgery | Admitting: Orthopedic Surgery

## 2017-09-03 DIAGNOSIS — S42302K Unspecified fracture of shaft of humerus, left arm, subsequent encounter for fracture with nonunion: Secondary | ICD-10-CM | POA: Insufficient documentation

## 2017-09-03 DIAGNOSIS — D649 Anemia, unspecified: Secondary | ICD-10-CM | POA: Diagnosis present

## 2017-09-03 DIAGNOSIS — X58XXXD Exposure to other specified factors, subsequent encounter: Secondary | ICD-10-CM | POA: Insufficient documentation

## 2017-09-03 LAB — COMPREHENSIVE METABOLIC PANEL
ALBUMIN: 3.4 g/dL — AB (ref 3.5–5.0)
ALK PHOS: 110 U/L (ref 38–126)
ALT: 12 U/L — AB (ref 14–54)
AST: 19 U/L (ref 15–41)
Anion gap: 9 (ref 5–15)
BILIRUBIN TOTAL: 0.4 mg/dL (ref 0.3–1.2)
BUN: 15 mg/dL (ref 6–20)
CO2: 25 mmol/L (ref 22–32)
CREATININE: 1.12 mg/dL — AB (ref 0.44–1.00)
Calcium: 8.7 mg/dL — ABNORMAL LOW (ref 8.9–10.3)
Chloride: 105 mmol/L (ref 101–111)
GFR calc Af Amer: 57 mL/min — ABNORMAL LOW (ref >60)
GFR calc non Af Amer: 49 mL/min — ABNORMAL LOW (ref >60)
GLUCOSE: 110 mg/dL — AB (ref 65–99)
Potassium: 4.2 mmol/L (ref 3.5–5.1)
SODIUM: 139 mmol/L (ref 135–145)
TOTAL PROTEIN: 7.4 g/dL (ref 6.5–8.1)

## 2017-09-03 LAB — IRON AND TIBC
Iron: 21 ug/dL — ABNORMAL LOW (ref 28–170)
Saturation Ratios: 7 % — ABNORMAL LOW (ref 10.4–31.8)
TIBC: 296 ug/dL (ref 250–450)
UIBC: 275 ug/dL

## 2017-09-03 LAB — FERRITIN: FERRITIN: 13 ng/mL (ref 11–307)

## 2017-09-03 LAB — FOLATE: Folate: 25 ng/mL (ref >5.9)

## 2017-09-04 ENCOUNTER — Other Ambulatory Visit
Admission: RE | Admit: 2017-09-04 | Discharge: 2017-09-04 | Disposition: A | Discharge status: Home / Self Care | Payer: Medicare Other | Source: Ambulatory Visit | Attending: Internal Medicine | Admitting: Internal Medicine

## 2017-09-04 ENCOUNTER — Other Ambulatory Visit: Payer: Self-pay | Admitting: Internal Medicine

## 2017-09-04 DIAGNOSIS — R51 Headache: Secondary | ICD-10-CM | POA: Diagnosis not present

## 2017-09-04 DIAGNOSIS — Z79899 Other long term (current) drug therapy: Secondary | ICD-10-CM | POA: Insufficient documentation

## 2017-09-04 DIAGNOSIS — G8929 Other chronic pain: Secondary | ICD-10-CM | POA: Diagnosis present

## 2017-09-04 LAB — CBC WITH DIFFERENTIAL/PLATELET
BASOS PCT: 1 %
Basophils Absolute: 0 10*3/uL (ref 0–0.1)
EOS ABS: 0.5 10*3/uL (ref 0–0.7)
Eosinophils Relative: 7 %
HCT: 33.8 % — ABNORMAL LOW (ref 35.0–47.0)
Hemoglobin: 10.9 g/dL — ABNORMAL LOW (ref 12.0–16.0)
Lymphocytes Relative: 17 %
Lymphs Abs: 1.1 10*3/uL (ref 1.0–3.6)
MCH: 28.8 pg (ref 26.0–34.0)
MCHC: 32.4 g/dL (ref 32.0–36.0)
MCV: 88.8 fL (ref 80.0–100.0)
MONO ABS: 0.6 10*3/uL (ref 0.2–0.9)
MONOS PCT: 9 %
NEUTROS PCT: 66 %
Neutro Abs: 4.3 10*3/uL (ref 1.4–6.5)
Platelets: 276 10*3/uL (ref 150–440)
RBC: 3.8 MIL/uL (ref 3.80–5.20)
RDW: 13.8 % (ref 11.5–14.5)
WBC: 6.5 10*3/uL (ref 3.6–11.0)

## 2017-09-04 LAB — VITAMIN B12: Vitamin B-12: 5208 pg/mL — ABNORMAL HIGH (ref 180–914)

## 2017-09-05 ENCOUNTER — Inpatient Hospital Stay: Admit: 2017-09-05 | Payer: Self-pay

## 2017-09-05 DIAGNOSIS — G8929 Other chronic pain: Secondary | ICD-10-CM | POA: Diagnosis not present

## 2017-09-05 LAB — PROTEIN ELECTROPHORESIS, SERUM
A/G RATIO SPE: 1.1 (ref 0.7–1.7)
A/G Ratio: 1.1 (ref 0.7–1.7)
ALPHA-1-GLOBULIN: 0.3 g/dL (ref 0.0–0.4)
ALPHA-2-GLOBULIN: 0.7 g/dL (ref 0.4–1.0)
Albumin ELP: 3.6 g/dL (ref 2.9–4.4)
Albumin ELP: 3.6 g/dL (ref 2.9–4.4)
Alpha-1-Globulin: 0.3 g/dL (ref 0.0–0.4)
Alpha-2-Globulin: 0.8 g/dL (ref 0.4–1.0)
BETA GLOBULIN: 1 g/dL (ref 0.7–1.3)
Beta Globulin: 1 g/dL (ref 0.7–1.3)
GLOBULIN, TOTAL: 3.4 g/dL (ref 2.2–3.9)
Gamma Globulin: 1.2 g/dL (ref 0.4–1.8)
Gamma Globulin: 1.2 g/dL (ref 0.4–1.8)
Globulin, Total: 3.2 g/dL (ref 2.2–3.9)
TOTAL PROTEIN ELP: 7 g/dL (ref 6.0–8.5)
Total Protein ELP: 6.8 g/dL (ref 6.0–8.5)

## 2017-09-05 LAB — PTH, INTACT AND CALCIUM
CALCIUM TOTAL (PTH): 8.7 mg/dL (ref 8.7–10.3)
PTH: 59 pg/mL (ref 15–65)

## 2017-09-05 LAB — PROLACTIN: PROLACTIN: 12.8 ng/mL (ref 4.8–23.3)

## 2017-09-08 LAB — UPEP/TP, 24-HR URINE
ALBUMIN, U: 30.3 %
ALPHA 1 UR: 7.8 %
ALPHA 2, URINE: 16.8 %
Beta, Urine: 24.7 %
Gamma Globulin, Urine: 20.4 %
TOTAL PROTEIN, URINE-UR/DAY: 99 mg/(24.h) (ref 30–150)
Total Protein, Urine: 10.4 mg/dL

## 2018-03-05 ENCOUNTER — Other Ambulatory Visit: Payer: Self-pay | Admitting: Internal Medicine

## 2018-03-05 DIAGNOSIS — R053 Chronic cough: Secondary | ICD-10-CM

## 2018-03-05 DIAGNOSIS — R05 Cough: Secondary | ICD-10-CM

## 2018-03-10 ENCOUNTER — Ambulatory Visit
Admission: RE | Admit: 2018-03-10 | Discharge: 2018-03-10 | Disposition: A | Discharge status: Home / Self Care | Payer: Medicare Other | Source: Ambulatory Visit | Attending: Internal Medicine | Admitting: Internal Medicine

## 2018-03-10 DIAGNOSIS — I2510 Atherosclerotic heart disease of native coronary artery without angina pectoris: Secondary | ICD-10-CM | POA: Insufficient documentation

## 2018-03-10 DIAGNOSIS — R918 Other nonspecific abnormal finding of lung field: Secondary | ICD-10-CM | POA: Insufficient documentation

## 2018-03-10 DIAGNOSIS — R05 Cough: Secondary | ICD-10-CM | POA: Diagnosis not present

## 2018-03-10 DIAGNOSIS — R053 Chronic cough: Secondary | ICD-10-CM

## 2018-03-10 DIAGNOSIS — I700 Atherosclerosis of aorta: Secondary | ICD-10-CM | POA: Insufficient documentation

## 2018-03-13 ENCOUNTER — Other Ambulatory Visit: Payer: Self-pay | Admitting: Internal Medicine

## 2018-03-13 DIAGNOSIS — J189 Pneumonia, unspecified organism: Secondary | ICD-10-CM

## 2018-03-26 ENCOUNTER — Ambulatory Visit (INDEPENDENT_AMBULATORY_CARE_PROVIDER_SITE_OTHER): Payer: Medicare Other | Admitting: Internal Medicine

## 2018-03-26 ENCOUNTER — Encounter: Payer: Self-pay | Admitting: Internal Medicine

## 2018-03-26 VITALS — BP 132/78 | HR 72 | Ht 62.0 in | Wt 243.0 lb

## 2018-03-26 DIAGNOSIS — J449 Chronic obstructive pulmonary disease, unspecified: Secondary | ICD-10-CM

## 2018-03-26 DIAGNOSIS — J441 Chronic obstructive pulmonary disease with (acute) exacerbation: Secondary | ICD-10-CM | POA: Diagnosis not present

## 2018-03-26 MED ORDER — TIOTROPIUM BROMIDE MONOHYDRATE 1.25 MCG/ACT IN AERS: 2.0000 | INHALATION_SPRAY | RESPIRATORY_TRACT | 6 refills | Status: DC

## 2018-03-26 NOTE — Patient Instructions (Addendum)
Continue ADVAIR  Start SPIRIVA RESPIMAT 1.25  TAKE DOUNEBS EVERY 4 HRS for NEXT 3 DAYS TEHN EVERY 4 HRS AS NEEDED  Continue with Antibiotics as prescribed  Check for hypoxia-needs ONO  Follow up CT chest in 2 months

## 2018-03-26 NOTE — Progress Notes (Signed)
Name: Rachel Shaffer MRN: [id number] DOB: 05-05-1951     CONSULTATION DATE: 9.12.19 REFERRING MD : whitaker  CHIEF COMPLAINT: SOB, abnormal CT chest  STUDIES:   03/19/2018 CT chest Independently reviewed by Me today Patient has a right upper lobe groundglass opacification she also has a right lower lobe groundglass opacification patient not very prominent recommend repeat CT chest in 2 months  HISTORY OF PRESENT ILLNESS:  68 year old pleasant white female seen today for shortness of breath wheezing productive cough for the last 2 years Patient has been on Advair and DuoNeb therapy for the last 5 years I have explained to her that these medications are usually for patients with COPD She states as she was may be diagnosed with COPD several years ago  At this time patient had progressive increased work of breathing along with wheezing with increased mucus production over the last couple weeks Patient underwent CT of the chest which showed abnormalities of the CAT scan of the right upper lobe and right lower lobe bronchus opacifications  She is currently on prednisone therapy 20 mg daily for the last 5 days and states that she has gained weight She is currently on doxycycline therapy  Patient is a non-smoker however she used to work in the tobacco feels and had extensive secondhand smoke as a child and going up, her mom was a heavy smoker   I have explained to patient that patient has signs symptoms of chronic bronchitis COPD At this time she has a mild COPD exacerbation  No evidence of heart failure at this time No signs of significant distress    PAST MEDICAL HISTORY :   has a past medical history of Anemia, Anxiety, Aortic regurgitation, Arthritis, Asthma, Blood transfusion without reported diagnosis, Bronchitis, chronic ([place]), Cataract, Complication of anesthesia, COPD (chronic obstructive pulmonary disease) ([place]), Coronary artery disease, DDD (degenerative disc disease),  cervical, Depression, DJD (degenerative joint disease), Dyspnea, Family history of adverse reaction to anesthesia, Fibromyalgia, Folic acid deficiency, GERD (gastroesophageal reflux disease), History of kidney stones, Hyperlipidemia, Hypertension, Hypothyroidism, Osteoporosis, Pain, PONV (postoperative nausea and vomiting), Seizures ([place]), Sjogren's disease ([place]), Spinal headache, Stroke ([place]), Vasculitis ([place]), and Vitamin D deficiency.  has a past surgical history that includes Appendectomy; Abdominal hysterectomy; Cholecystectomy; Breast biopsy (Left, 1992); Knee arthroscopy (Left); Tonsillectomy (1959); Oophorectomy (Bilateral); Colonoscopy; Esophagogastroduodenoscopy; Stomach surgery; Total knee arthroplasty (Right, 04/26/2015); Esophagogastroduodenoscopy (N/A, 05/15/2015); ORIF humerus fracture (Left, 08/15/2015); Breast lumpectomy (Left, 1992); Hardware Removal (Left, 11/14/2016); ORIF humerus fracture (Left, 11/14/2016); Brain surgery (01/01/2005); Cystoscopy; ORIF humerus fracture (Left, 08/05/2017); and Hardware Removal (Left, 08/05/2017). Prior to Admission medications   Medication Sig Start Date End Date Taking? Authorizing Provider  albuterol (PROVENTIL HFA;VENTOLIN HFA) 108 (90 Base) MCG/ACT inhaler Inhale 2 puffs into the lungs every 4 (four) hours as needed for wheezing or shortness of breath.    Yes [provider]  amLODipine (NORVASC) 10 MG tablet Take 10 mg by mouth at bedtime.    Yes [provider]  amoxicillin (AMOXIL) 875 MG tablet Take 1,750 mg by mouth See admin instructions. Take 1750 mg by mouth 1 hour prior to dental procedure   Yes [provider]  Artificial Tear Ointment (DRY EYES OP) Place 2 drops into both eyes 4 (four) times daily as needed (for dry eyes).   Yes [provider]  aspirin EC 81 MG tablet Take 81 mg by mouth at bedtime.    Yes [provider]  benzonatate (TESSALON) 200 MG capsule Take 200 mg by  mouth 3 (three) times  daily as needed for cough.   Yes [provider]  Biotin 5000 MCG CAPS Take 5,000 mcg by mouth daily.    Yes [provider]  butalbital-acetaminophen-caffeine (FIORICET, ESGIC) 50-325-40 MG tablet Take 2 tablets by mouth every 6 (six) hours as needed for migraine.    Yes [provider]  citalopram (CELEXA) 20 MG tablet Take 20 mg by mouth 2 (two) times daily.   Yes [provider]  diclofenac sodium (VOLTAREN) 1 % GEL Apply 1 application topically 4 (four) times daily as needed (for pain).   Yes [provider]  diphenoxylate-atropine (LOMOTIL) 2.5-0.025 MG tablet Take 1 tablet by mouth every 6 (six) hours as needed for diarrhea or loose stools.    Yes [provider]  EVENING PRIMROSE OIL PO Take 1,300 mg by mouth at bedtime.   Yes [provider]  famotidine (PEPCID) 20 MG tablet Take 1 tablet (20 mg total) by mouth 2 (two) times daily. 05/16/15  Yes Idelle Crouch, MD  ferrous sulfate 325 (65 FE) MG tablet Take 325 mg by mouth every Friday.   Yes [provider]  Fluticasone-Salmeterol (ADVAIR) 250-50 MCG/DOSE AEPB Inhale 1 puff into the lungs 2 (two) times daily as needed (for shortness of breath or wheezing).    Yes [provider]  folic acid (FOLVITE) 1 MG tablet Take 1 mg by mouth daily.   Yes [provider]  Ginkgo Biloba (GNP GINGKO BILOBA EXTRACT PO) Take 40 mg by mouth daily.   Yes [provider]  hydrOXYzine (ATARAX/VISTARIL) 25 MG tablet Take 25 mg by mouth 4 (four) times daily as needed for itching.    Yes [provider]  ipratropium-albuterol (DUONEB) 0.5-2.5 (3) MG/3ML SOLN Take 3 mLs by nebulization every 6 (six) hours as needed (for shortness of breath or wheezing).   Yes [provider]  lansoprazole (PREVACID) 30 MG capsule Take 30 mg by mouth 2 (two) times daily.   Yes [provider]  lisinopril (PRINIVIL,ZESTRIL) 20 MG tablet Take 20 mg by mouth 2  (two) times daily.    Yes [provider]  LORazepam (ATIVAN) 1 MG tablet Take 4 mg by mouth at bedtime.    Yes [provider]  magnesium oxide (MAG-OX) 400 MG tablet Take 400 mg by mouth at bedtime.    Yes [provider]  Melatonin 5 MG TABS Take 5 mg by mouth at bedtime.    Yes [provider]  Menthol, Topical Analgesic, (BIOFREEZE EX) Apply 1 application topically 3 (three) times daily as needed (for pain).   Yes [provider]  metaxalone (SKELAXIN) 400 MG tablet Take 1 tablet (400 mg total) by mouth 3 (three) times daily. Patient taking differently: Take 800 mg by mouth See admin instructions. Take 800 mg by mouth twice daily. May take additional 400 mg if needed for pain 05/16/15  Yes Sparks, Leonie Douglas, MD  nystatin (NYSTATIN) powder Apply 1 g topically daily.    Yes [provider]  oxyCODONE (OXY IR/ROXICODONE) 5 MG immediate release tablet Take 1-2 tablets (5-10 mg total) by mouth every 6 (six) hours as needed for breakthrough pain. 08/05/17  Yes Ainsley Spinner, PA-C  Probiotic Product (PROBIOTIC DAILY PO) Take 1 capsule by mouth daily. 30 billion   Yes [provider]  promethazine (PHENERGAN) 25 MG tablet Take 25 mg by mouth every 6 (six) hours as needed for nausea or vomiting.    Yes [provider]  propranolol ER (INDERAL LA) 60 MG 24 hr capsule Take 60 mg by mouth at bedtime.    Yes [provider]  pyridoxine (B-6) 100 MG tablet Take 100 mg by mouth daily.   Yes [provider]  Riboflavin (VITAMIN B-2 PO) Take 400 mg by mouth daily.   Yes [provider]  simvastatin (ZOCOR) 40 MG tablet Take 40 mg by mouth at bedtime.    Yes [provider]  Vitamin D, Ergocalciferol, (DRISDOL) 50000 units CAPS capsule Take 50,000 Units by mouth every Saturday.    Yes [provider]   Allergies  Allergen Reactions  . Ceftin [Cefuroxime] Anaphylaxis  . Iodinated Diagnostic Agents  Hives  . Nucynta [Tapentadol] Anaphylaxis  . Other Anaphylaxis, Swelling and Other (See Comments)    Pt states that she is allergic to kiwi, pineapple, pecans, walnuts and all tree nuts.     . Budesonide-Formoterol Fumarate Hives  . Cadmium Other (See Comments)    Reaction:  Unknown   . Cephalexin Itching and Swelling  . Codeine Other (See Comments)    Reaction:  Unknown   . Demerol [Meperidine] Other (See Comments)    Pt unsure  . Depakote [Divalproex Sodium] Other (See Comments)    Reaction:  Unknown   . Dilaudid [Hydromorphone Hcl] Other (See Comments)    Reaction:  Hallucinations   . Feldene [Piroxicam] Other (See Comments)    Reaction:  Unknown   . Fosamax [Alendronate] Nausea And Vomiting  . Imitrex [Sumatriptan] Other (See Comments)    Reaction:  Increased heart rate   . Latex Itching and Other (See Comments)    redness  . Methotrexate Derivatives Other (See Comments)    Reaction:  GI bleeding   . Morphine And Related Nausea And Vomiting  . Nexium [Esomeprazole Magnesium] Other (See Comments)    Reaction:  Unknown   . Nsaids Itching and Other (See Comments)    Can take Ibuprofen  . Percocet [Oxycodone-Acetaminophen] Itching  . Protonix [Pantoprazole Sodium] Other (See Comments)    Unknown  . Relafen [Nabumetone] Other (See Comments)    Reaction:  Unknown   . Tegretol [Carbamazepine] Other (See Comments)    Reaction:  Aplastic anemia  . Topamax [Topiramate] Other (See Comments)    Reaction:  Unknown   . Tramadol Other (See Comments)    Headache  . Trazodone And Nefazodone Other (See Comments)    Reaction:  Unknown   . Vicodin [Hydrocodone-Acetaminophen] Itching  . Wellbutrin [Bupropion] Other (See Comments)    Reaction:  Unknown     FAMILY HISTORY:  family history includes Arthritis in her mother; Asthma in her father; Breast cancer (age of onset: 30) in her maternal aunt; COPD in her father; Heart disease in her father and mother; Hyperlipidemia in her father  and mother; Hypertension in her father and mother; Kidney disease in her father and mother; Stroke in her father; Vision loss in her mother. SOCIAL HISTORY:  reports that she quit smoking about 42 years ago. Her smoking use included cigarettes. She quit after 1.00 year of use. She has never used smokeless tobacco. She reports that she does not drink alcohol or use drugs.  REVIEW OF SYSTEMS:   Constitutional: Negative for fever, chills, weight loss, malaise/fatigue and diaphoresis.  HENT: Negative for hearing loss, ear pain, nosebleeds, congestion, sore throat, neck pain, tinnitus and ear discharge.   Eyes: Negative for blurred vision, double vision, photophobia, pain, discharge and redness.  Respiratory: + Cough,-hemoptysis, +  sputum production, + shortness of breath, + wheezing and stridor.   Cardiovascular: Negative for chest pain, palpitations, orthopnea, claudication, leg swelling and PND.  Gastrointestinal: Negative for heartburn, nausea, vomiting, abdominal pain, diarrhea, constipation, blood in stool and melena.  Genitourinary: Negative for dysuria, urgency, frequency, hematuria and flank pain.  Musculoskeletal: Negative for myalgias, back pain, joint pain and falls.  Skin: Negative for itching and rash.  Neurological: Negative for dizziness, tingling, tremors, sensory change, speech change, focal weakness, seizures, loss of consciousness, weakness and headaches.  Endo/Heme/Allergies: Negative for environmental allergies and polydipsia. Does not bruise/bleed easily.  ALL OTHER ROS ARE NEGATIVE    BP 132/78 (BP Location: Left Arm, Cuff Size: Normal)   Pulse 72   Ht 5\' 2"  (1.575 m)   Wt 243 lb (110.2 kg)   SpO2 94%   BMI 44.45 kg/m   Physical Examination:   GENERAL:NAD, no fevers, chills, no weakness no fatigue HEAD: Normocephalic, atraumatic.  EYES: Pupils equal, round, reactive to light. Extraocular muscles intact. No scleral icterus.  MOUTH: Moist mucosal membrane.   EAR,  NOSE, THROAT: Clear without exudates. No external lesions.  NECK: Supple. No thyromegaly. No nodules. No JVD.  PULMONARY:CTA B/L + wheezes, no crackles, no rhonchi CARDIOVASCULAR: S1 and S2. Regular rate and rhythm. No murmurs, rubs, or gallops. No edema.  GASTROINTESTINAL: Soft, nontender, nondistended. No masses. Positive bowel sounds.  MUSCULOSKELETAL: No swelling, clubbing, or edema. Range of motion full in all extremities.  NEUROLOGIC: Cranial nerves II through XII are intact. No gross focal neurological deficits.  SKIN: No ulceration, lesions, rashes, or cyanosis. Skin warm and dry. Turgor intact.  PSYCHIATRIC: Mood, affect within normal limits. The patient is awake, alert and oriented x 3. Insight, judgment intact.      ASSESSMENT / PLAN: 67 year old pleasant white female seen today for assessment for abnormal CT scan findings along with signs and symptoms of chronic bronchitis with COPD in the setting of morbid obesity and deconditioned state and likely diagnosis of pneumonia based on her CT scan findings and her symptoms of productive cough wheezing and shortness of breath  #1 chronic shortness of breath and dyspnea on exertion Multifactorial most likely related to her deconditioned state obesity and COPD At this time patient has a risk of hypoxia and would need assessment with overnight pulse oximetry  #2 presumed COPD Patient will need pulmonary function testing at some point we will reassess this at next office visit Recommend continuing Advair as prescribed We will add Spiriva Respimat 1.25 inhaler therapy to her regimen  #3 abnormal CT chest with groundglass opacifications most likely related to pneumonia Recommend continuing antibiotics status post completion of steroids Repeat CT chest in 2 months for interval changes and progression  #4 acute COPD exacerbation Continue antibiotics as prescribed She has completed her prednisone therapy and does not want any more  prednisone She still is wheezing so I have recommended her to use her DuoNeb's every 4 hours scheduled for the next 2 days and then every 4 hours as needed after that  #5 obesity -recommend significant weight loss -recommend changing diet  #6 deconditioned state -Recommend increased daily activity and exercise      Patient/Family are satisfied with Plan of action and management. All questions answered Follow-up after test completed and follow-up in 3 months   Marjory Meints Patricia Pesa, M.D.  [facility]  Medical Director [place] Director [facility] Cardio-Pulmonary Department

## 2018-03-31 ENCOUNTER — Encounter: Payer: Self-pay | Admitting: Internal Medicine

## 2018-04-07 ENCOUNTER — Telehealth: Payer: Self-pay

## 2018-04-07 DIAGNOSIS — J449 Chronic obstructive pulmonary disease, unspecified: Secondary | ICD-10-CM

## 2018-04-07 NOTE — Telephone Encounter (Signed)
Spoke with patient regarding results of ONO. Notified her Dr. Mortimer Fries wants her to start on 1L at night. Will place DME referral and patient aware DME company will contact her to set up. No further questions at this time.

## 2018-04-30 ENCOUNTER — Telehealth: Payer: Self-pay | Admitting: Internal Medicine

## 2018-04-30 NOTE — Telephone Encounter (Signed)
Returned call to patient. She states cough worse since Monday with yellow mucus. She using Spriva and Advair. Pt has not been using her neb txt. Encouraged patient to take neb q 4 hrs until better. If not better by Monday call office back. Nothing further needed.

## 2018-04-30 NOTE — Telephone Encounter (Signed)
Patient calling stating she having some issues with her cough again She states the first three weeks of being on the Spiriva it worked wonderfully she states Now this past week after stating a new vile of it she has a bad cough along with coughing up yellowish material She is worried for in about two weeks her granddaughter is getting married and can't have this by than  She states it is even getting harder for her breath   Please advise

## 2018-06-02 ENCOUNTER — Ambulatory Visit
Admission: RE | Admit: 2018-06-02 | Discharge: 2018-06-02 | Disposition: A | Discharge status: Home / Self Care | Payer: Medicare Other | Source: Ambulatory Visit | Attending: Internal Medicine | Admitting: Internal Medicine

## 2018-06-02 DIAGNOSIS — J189 Pneumonia, unspecified organism: Secondary | ICD-10-CM | POA: Diagnosis present

## 2018-06-02 DIAGNOSIS — I700 Atherosclerosis of aorta: Secondary | ICD-10-CM | POA: Insufficient documentation

## 2018-06-04 ENCOUNTER — Telehealth: Payer: Self-pay | Admitting: Internal Medicine

## 2018-06-04 NOTE — Telephone Encounter (Signed)
Noted  

## 2018-06-04 NOTE — Telephone Encounter (Signed)
Patient was told by pcp she needs a bronch and she wants an order for portable o2 with inogene will discuss at ov on Tuesday but wanted a note relayed to md today

## 2018-06-09 ENCOUNTER — Ambulatory Visit (INDEPENDENT_AMBULATORY_CARE_PROVIDER_SITE_OTHER): Payer: Medicare Other | Admitting: Internal Medicine

## 2018-06-09 ENCOUNTER — Encounter: Payer: Self-pay | Admitting: Internal Medicine

## 2018-06-09 VITALS — BP 132/84 | HR 71 | Ht 62.0 in | Wt 247.0 lb

## 2018-06-09 DIAGNOSIS — J441 Chronic obstructive pulmonary disease with (acute) exacerbation: Secondary | ICD-10-CM

## 2018-06-09 MED ORDER — PREDNISONE 10 MG PO TABS
10.0000 mg | ORAL_TABLET | Freq: Every day | ORAL | 1 refills | Status: DC
Start: 2018-06-09 — End: 2018-07-20

## 2018-06-09 MED ORDER — GUAIFENESIN-CODEINE 100-10 MG/5ML PO SOLN: 5.0000 mL | ORAL | 0 refills | Status: DC | PRN

## 2018-06-09 MED ORDER — IPRATROPIUM-ALBUTEROL 0.5-2.5 (3) MG/3ML IN SOLN
3.0000 mL | Freq: Once | RESPIRATORY_TRACT | Status: AC
  Administered 2018-06-09: 3 mL via RESPIRATORY_TRACT

## 2018-06-09 NOTE — Patient Instructions (Signed)
Continue Antibiotics as prescribed  Prednisone 10 mg daily for 10 days  Continue inhalers as prescribed  Plan for Airway examination with Bronchoscopy

## 2018-06-09 NOTE — Addendum Note (Signed)
Addended by: Darreld Mclean on: 06/09/2018 02:56 PM   Modules accepted: Orders

## 2018-06-09 NOTE — H&P (View-Only) (Signed)
Name: Rachel Shaffer MRN: [id number] DOB: 05-04-1951     CONSULTATION DATE: 9.12.19 REFERRING MD : whitaker  CHIEF COMPLAINT: SOB, abnormal CT chest  STUDIES:   03/19/2018 CT chest Independently reviewed by Me today Patient has a right upper lobe groundglass opacification she also has a right lower lobe groundglass opacification patient not very prominent recommend repeat CT chest in 2 months   11.19.19 CT chest Independently reviewed by Me today LUL airway  Obstruction mucus plug vs endobronchial lesion  HISTORY OF PRESENT ILLNESS: Follow-up shortness of breath Patient has increased work of breathing and dyspnea on exertion Wheezing bilateral and persistent Has a productive cough Is currently being treated for COPD exacerbation with antibiotics  Patient will need nebulized therapy in office at this time She will also need prednisone therapy for acute COPD exacerbation   Patient is a non-smoker Has history of extensive's secondhand smoke exposure as a child   Follow-up abnormal CT chest The right-sided opacifications have resolved however there is a new left upper lobe collapse possible endobronchial lesion may be mucous plugging Patient will need airway examination for further evaluation with a bronchoscopy   The Risks and Benefits of the Bronchoscopy procedure with bronchoscopy were explained to patient/family.  I have discussed the risk for Acute Bleeding, increased chance of Infection, increased chance of Respiratory Failure and Cardiac Arrest, increased chance of pneumothorax and collapsed lung, as well as increased Stroke and Death.  I have also explained to avoid all types of NSAIDs 1 week prior to procedure date  to decrease chance of bleeding, and also to avoid food and drinks the midnight prior to procedure.  The procedure consists of a video camera with a light source to be placed and inserted  into the lungs to  look for abnormal tissue and to obtain tissue  samples by using needle and biopsy tools.  The patient/family understand the risks and benefits and have agreed to proceed with procedure.  CT scans were reviewed with the patient  Prior to Admission medications   Medication Sig Start Date End Date Taking? Authorizing Provider  albuterol (PROVENTIL HFA;VENTOLIN HFA) 108 (90 Base) MCG/ACT inhaler Inhale 2 puffs into the lungs every 4 (four) hours as needed for wheezing or shortness of breath.    Yes [provider]  amLODipine (NORVASC) 10 MG tablet Take 10 mg by mouth at bedtime.    Yes [provider]  amoxicillin (AMOXIL) 875 MG tablet Take 1,750 mg by mouth See admin instructions. Take 1750 mg by mouth 1 hour prior to dental procedure   Yes [provider]  Artificial Tear Ointment (DRY EYES OP) Place 2 drops into both eyes 4 (four) times daily as needed (for dry eyes).   Yes [provider]  aspirin EC 81 MG tablet Take 81 mg by mouth at bedtime.    Yes [provider]  benzonatate (TESSALON) 200 MG capsule Take 200 mg by mouth 3 (three) times daily as needed for cough.   Yes [provider]  Biotin 5000 MCG CAPS Take 5,000 mcg by mouth daily.    Yes [provider]  butalbital-acetaminophen-caffeine (FIORICET, ESGIC) 50-325-40 MG tablet Take 2 tablets by mouth every 6 (six) hours as needed for migraine.    Yes [provider]  citalopram (CELEXA) 20 MG tablet Take 20 mg by mouth 2 (two) times daily.   Yes [provider]  diclofenac sodium (VOLTAREN) 1 % GEL Apply 1 application topically 4 (four) times  daily as needed (for pain).   Yes [provider]  diphenoxylate-atropine (LOMOTIL) 2.5-0.025 MG tablet Take 1 tablet by mouth every 6 (six) hours as needed for diarrhea or loose stools.    Yes [provider]  EVENING PRIMROSE OIL PO Take 1,300 mg by mouth at bedtime.   Yes [provider]  famotidine (PEPCID) 20 MG tablet Take 1 tablet  (20 mg total) by mouth 2 (two) times daily. 05/16/15  Yes Idelle Crouch, MD  ferrous sulfate 325 (65 FE) MG tablet Take 325 mg by mouth every Friday.   Yes [provider]  Fluticasone-Salmeterol (ADVAIR) 250-50 MCG/DOSE AEPB Inhale 1 puff into the lungs 2 (two) times daily as needed (for shortness of breath or wheezing).    Yes [provider]  folic acid (FOLVITE) 1 MG tablet Take 1 mg by mouth daily.   Yes [provider]  Ginkgo Biloba (GNP GINGKO BILOBA EXTRACT PO) Take 40 mg by mouth daily.   Yes [provider]  hydrOXYzine (ATARAX/VISTARIL) 25 MG tablet Take 25 mg by mouth 4 (four) times daily as needed for itching.    Yes [provider]  ipratropium-albuterol (DUONEB) 0.5-2.5 (3) MG/3ML SOLN Take 3 mLs by nebulization every 6 (six) hours as needed (for shortness of breath or wheezing).   Yes [provider]  lansoprazole (PREVACID) 30 MG capsule Take 30 mg by mouth 2 (two) times daily.   Yes [provider]  lisinopril (PRINIVIL,ZESTRIL) 20 MG tablet Take 20 mg by mouth 2 (two) times daily.    Yes [provider]  LORazepam (ATIVAN) 1 MG tablet Take 4 mg by mouth at bedtime.    Yes [provider]  magnesium oxide (MAG-OX) 400 MG tablet Take 400 mg by mouth at bedtime.    Yes [provider]  Melatonin 5 MG TABS Take 5 mg by mouth at bedtime.    Yes [provider]  Menthol, Topical Analgesic, (BIOFREEZE EX) Apply 1 application topically 3 (three) times daily as needed (for pain).   Yes [provider]  metaxalone (SKELAXIN) 400 MG tablet Take 1 tablet (400 mg total) by mouth 3 (three) times daily. Patient taking differently: Take 800 mg by mouth See admin instructions. Take 800 mg by mouth twice daily. May take additional 400 mg if needed for pain 05/16/15  Yes Sparks, Leonie Douglas, MD  nystatin (NYSTATIN) powder Apply 1 g topically daily.    Yes [provider]  oxyCODONE  (OXY IR/ROXICODONE) 5 MG immediate release tablet Take 1-2 tablets (5-10 mg total) by mouth every 6 (six) hours as needed for breakthrough pain. 08/05/17  Yes Ainsley Spinner, PA-C  Probiotic Product (PROBIOTIC DAILY PO) Take 1 capsule by mouth daily. 30 billion   Yes [provider]  promethazine (PHENERGAN) 25 MG tablet Take 25 mg by mouth every 6 (six) hours as needed for nausea or vomiting.    Yes [provider]  propranolol ER (INDERAL LA) 60 MG 24 hr capsule Take 60 mg by mouth at bedtime.    Yes [provider]  pyridoxine (B-6) 100 MG tablet Take 100 mg by mouth daily.   Yes [provider]  Riboflavin (VITAMIN B-2 PO) Take 400 mg by mouth daily.   Yes [provider]  simvastatin (ZOCOR) 40 MG tablet Take 40 mg by mouth at bedtime.    Yes [provider]  Vitamin D, Ergocalciferol, (DRISDOL) 50000 units CAPS capsule Take 50,000 Units by mouth  every Saturday.    Yes [provider]   Allergies  Allergen Reactions  . Ceftin [Cefuroxime] Anaphylaxis  . Iodinated Diagnostic Agents Hives  . Nucynta [Tapentadol] Anaphylaxis  . Other Anaphylaxis, Swelling and Other (See Comments)    Pt states that she is allergic to kiwi, pineapple, pecans, walnuts and all tree nuts.     . Budesonide-Formoterol Fumarate Hives  . Cadmium Other (See Comments)    Reaction:  Unknown   . Cephalexin Itching and Swelling  . Codeine Other (See Comments)    Reaction:  Unknown   . Demerol [Meperidine] Other (See Comments)    Pt unsure  . Depakote [Divalproex Sodium] Other (See Comments)    Reaction:  Unknown   . Dilaudid [Hydromorphone Hcl] Other (See Comments)    Reaction:  Hallucinations   . Feldene [Piroxicam] Other (See Comments)    Reaction:  Unknown   . Fosamax [Alendronate] Nausea And Vomiting  . Imitrex [Sumatriptan] Other (See Comments)    Reaction:  Increased heart rate   . Latex Itching and Other (See Comments)    redness  .  Methotrexate Derivatives Other (See Comments)    Reaction:  GI bleeding   . Morphine And Related Nausea And Vomiting  . Nexium [Esomeprazole Magnesium] Other (See Comments)    Reaction:  Unknown   . Nsaids Itching and Other (See Comments)    Can take Ibuprofen  . Percocet [Oxycodone-Acetaminophen] Itching  . Protonix [Pantoprazole Sodium] Other (See Comments)    Unknown  . Relafen [Nabumetone] Other (See Comments)    Reaction:  Unknown   . Tegretol [Carbamazepine] Other (See Comments)    Reaction:  Aplastic anemia  . Topamax [Topiramate] Other (See Comments)    Reaction:  Unknown   . Tramadol Other (See Comments)    Headache  . Trazodone And Nefazodone Other (See Comments)    Reaction:  Unknown   . Vicodin [Hydrocodone-Acetaminophen] Itching  . Wellbutrin [Bupropion] Other (See Comments)    Reaction:  Unknown       Review of Systems:  Gen:  Denies  fever, sweats, chills weigh loss  HEENT: Denies blurred vision, double vision, ear pain, eye pain, hearing loss, nose bleeds, sore throat Cardiac:  No dizziness, chest pain or heaviness, chest tightness,edema, No JVD Resp:   No cough, +sputum production, +shortness of breath,+wheezing, -hemoptysis,  Gi: Denies swallowing difficulty, stomach pain, nausea or vomiting, diarrhea, constipation, bowel incontinence Gu:  Denies bladder incontinence, burning urine Ext:   Denies Joint pain, stiffness or swelling Skin: Denies  skin rash, easy bruising or bleeding or hives Endoc:  Denies polyuria, polydipsia , polyphagia or weight change Psych:   Denies depression, insomnia or hallucinations  Other:  All other systems negative    BP 132/84 (BP Location: Left Arm, Cuff Size: Normal)   Pulse 71   Ht 5\' 2"  (1.575 m)   Wt 247 lb (112 kg)   SpO2 95%   BMI 45.18 kg/m   Physical Examination:   GENERAL:NAD, no fevers, chills, no weakness no fatigue HEAD: Normocephalic, atraumatic.  EYES: Pupils equal, round, reactive to light.  Extraocular muscles intact. No scleral icterus.  MOUTH: Moist mucosal membrane. Dentition intact. No abscess noted.  EAR, NOSE, THROAT: Clear without exudates. No external lesions.  NECK: Supple. No thyromegaly. No nodules. No JVD.  PULMONARY: CTA B/L+wheezing CARDIOVASCULAR: S1 and S2. Regular rate and rhythm. No murmurs, rubs, or gallops. No edema. Pedal pulses 2+ bilaterally.  GASTROINTESTINAL: Soft, nontender, nondistended. No masses. Positive  bowel sounds. No hepatosplenomegaly.  MUSCULOSKELETAL: No swelling, clubbing, or edema. Range of motion full in all extremities.  NEUROLOGIC: Cranial nerves II through XII are intact. No gross focal neurological deficits. Sensation intact. Reflexes intact.  SKIN: No ulceration, lesions, rashes, or cyanosis. Skin warm and dry. Turgor intact.  PSYCHIATRIC: Mood, affect within normal limits. The patient is awake, alert and oriented x 3. Insight, judgment intact.  ALL OTHER ROS ARE NEGATIVE         ASSESSMENT / PLAN:  67 year old pleasant white female with morbid obesity seen today for follow-up assessment for abnormal CT scan findings that shows a left upper lobe endobronchial lesion which could be a mucous plug but also cannot rule out endobronchial mass in the setting of COPD and morbid obesity with acute symptoms of wheezing and increased work of breathing with signs and symptoms of COPD exacerbation  Chronic shortness of breath and dyspnea on exertion Multifactorial etiology including COPD deconditioned state and morbid obesity Patient currently on oxygen therapy at nighttime Patient uses this and needs this for survival   Acute COPD exacerbation Continue antibiotics as prescribed Patient needs prednisone therapy 10 mg daily for 10 days Duo nebs as needed DuoNeb will be given in office today  COPD Unable to perform pulmonary function testing at this time Continue Advair as per bribed Patient has multiple complaints and adverse reactions  to Spiriva Recommend duo nebs as needed   Abnormal CT chest with left upper lobe endobronchial lesion This could be a mucous plug but could also be underlying malignancy Patient needs bronchoscopy for airway examination and assessment We will plan to do this next week     Obesity -recommend significant weight loss -recommend changing diet  Deconditioned state -Recommend increased daily activity and exercise      Patient/Family are satisfied with Plan of action and management. All questions answered  Corrin Parker, M.D.  [facility]  Medical Director [place] Director [facility] Cardio-Pulmonary Department

## 2018-06-09 NOTE — Progress Notes (Signed)
Name: Rachel Shaffer MRN: [id number] DOB: 06/13/1951     CONSULTATION DATE: 9.12.19 REFERRING MD : whitaker  CHIEF COMPLAINT: SOB, abnormal CT chest  STUDIES:   03/19/2018 CT chest Independently reviewed by Me today Patient has a right upper lobe groundglass opacification she also has a right lower lobe groundglass opacification patient not very prominent recommend repeat CT chest in 2 months   11.19.19 CT chest Independently reviewed by Me today LUL airway  Obstruction mucus plug vs endobronchial lesion  HISTORY OF PRESENT ILLNESS: Follow-up shortness of breath Patient has increased work of breathing and dyspnea on exertion Wheezing bilateral and persistent Has a productive cough Is currently being treated for COPD exacerbation with antibiotics  Patient will need nebulized therapy in office at this time She will also need prednisone therapy for acute COPD exacerbation   Patient is a non-smoker Has history of extensive's secondhand smoke exposure as a child   Follow-up abnormal CT chest The right-sided opacifications have resolved however there is a new left upper lobe collapse possible endobronchial lesion may be mucous plugging Patient will need airway examination for further evaluation with a bronchoscopy   The Risks and Benefits of the Bronchoscopy procedure with bronchoscopy were explained to patient/family.  I have discussed the risk for Acute Bleeding, increased chance of Infection, increased chance of Respiratory Failure and Cardiac Arrest, increased chance of pneumothorax and collapsed lung, as well as increased Stroke and Death.  I have also explained to avoid all types of NSAIDs 1 week prior to procedure date  to decrease chance of bleeding, and also to avoid food and drinks the midnight prior to procedure.  The procedure consists of a video camera with a light source to be placed and inserted  into the lungs to  look for abnormal tissue and to obtain tissue  samples by using needle and biopsy tools.  The patient/family understand the risks and benefits and have agreed to proceed with procedure.  CT scans were reviewed with the patient  Prior to Admission medications   Medication Sig Start Date End Date Taking? Authorizing Provider  albuterol (PROVENTIL HFA;VENTOLIN HFA) 108 (90 Base) MCG/ACT inhaler Inhale 2 puffs into the lungs every 4 (four) hours as needed for wheezing or shortness of breath.    Yes [provider]  amLODipine (NORVASC) 10 MG tablet Take 10 mg by mouth at bedtime.    Yes [provider]  amoxicillin (AMOXIL) 875 MG tablet Take 1,750 mg by mouth See admin instructions. Take 1750 mg by mouth 1 hour prior to dental procedure   Yes [provider]  Artificial Tear Ointment (DRY EYES OP) Place 2 drops into both eyes 4 (four) times daily as needed (for dry eyes).   Yes [provider]  aspirin EC 81 MG tablet Take 81 mg by mouth at bedtime.    Yes [provider]  benzonatate (TESSALON) 200 MG capsule Take 200 mg by mouth 3 (three) times daily as needed for cough.   Yes [provider]  Biotin 5000 MCG CAPS Take 5,000 mcg by mouth daily.    Yes [provider]  butalbital-acetaminophen-caffeine (FIORICET, ESGIC) 50-325-40 MG tablet Take 2 tablets by mouth every 6 (six) hours as needed for migraine.    Yes [provider]  citalopram (CELEXA) 20 MG tablet Take 20 mg by mouth 2 (two) times daily.   Yes [provider]  diclofenac sodium (VOLTAREN) 1 % GEL Apply 1 application topically 4 (four) times  daily as needed (for pain).   Yes [provider]  diphenoxylate-atropine (LOMOTIL) 2.5-0.025 MG tablet Take 1 tablet by mouth every 6 (six) hours as needed for diarrhea or loose stools.    Yes [provider]  EVENING PRIMROSE OIL PO Take 1,300 mg by mouth at bedtime.   Yes [provider]  famotidine (PEPCID) 20 MG tablet Take 1 tablet  (20 mg total) by mouth 2 (two) times daily. 05/16/15  Yes Idelle Crouch, MD  ferrous sulfate 325 (65 FE) MG tablet Take 325 mg by mouth every Friday.   Yes [provider]  Fluticasone-Salmeterol (ADVAIR) 250-50 MCG/DOSE AEPB Inhale 1 puff into the lungs 2 (two) times daily as needed (for shortness of breath or wheezing).    Yes [provider]  folic acid (FOLVITE) 1 MG tablet Take 1 mg by mouth daily.   Yes [provider]  Ginkgo Biloba (GNP GINGKO BILOBA EXTRACT PO) Take 40 mg by mouth daily.   Yes [provider]  hydrOXYzine (ATARAX/VISTARIL) 25 MG tablet Take 25 mg by mouth 4 (four) times daily as needed for itching.    Yes [provider]  ipratropium-albuterol (DUONEB) 0.5-2.5 (3) MG/3ML SOLN Take 3 mLs by nebulization every 6 (six) hours as needed (for shortness of breath or wheezing).   Yes [provider]  lansoprazole (PREVACID) 30 MG capsule Take 30 mg by mouth 2 (two) times daily.   Yes [provider]  lisinopril (PRINIVIL,ZESTRIL) 20 MG tablet Take 20 mg by mouth 2 (two) times daily.    Yes [provider]  LORazepam (ATIVAN) 1 MG tablet Take 4 mg by mouth at bedtime.    Yes [provider]  magnesium oxide (MAG-OX) 400 MG tablet Take 400 mg by mouth at bedtime.    Yes [provider]  Melatonin 5 MG TABS Take 5 mg by mouth at bedtime.    Yes [provider]  Menthol, Topical Analgesic, (BIOFREEZE EX) Apply 1 application topically 3 (three) times daily as needed (for pain).   Yes [provider]  metaxalone (SKELAXIN) 400 MG tablet Take 1 tablet (400 mg total) by mouth 3 (three) times daily. Patient taking differently: Take 800 mg by mouth See admin instructions. Take 800 mg by mouth twice daily. May take additional 400 mg if needed for pain 05/16/15  Yes Sparks, Leonie Douglas, MD  nystatin (NYSTATIN) powder Apply 1 g topically daily.    Yes [provider]  oxyCODONE  (OXY IR/ROXICODONE) 5 MG immediate release tablet Take 1-2 tablets (5-10 mg total) by mouth every 6 (six) hours as needed for breakthrough pain. 08/05/17  Yes Ainsley Spinner, PA-C  Probiotic Product (PROBIOTIC DAILY PO) Take 1 capsule by mouth daily. 30 billion   Yes [provider]  promethazine (PHENERGAN) 25 MG tablet Take 25 mg by mouth every 6 (six) hours as needed for nausea or vomiting.    Yes [provider]  propranolol ER (INDERAL LA) 60 MG 24 hr capsule Take 60 mg by mouth at bedtime.    Yes [provider]  pyridoxine (B-6) 100 MG tablet Take 100 mg by mouth daily.   Yes [provider]  Riboflavin (VITAMIN B-2 PO) Take 400 mg by mouth daily.   Yes [provider]  simvastatin (ZOCOR) 40 MG tablet Take 40 mg by mouth at bedtime.    Yes [provider]  Vitamin D, Ergocalciferol, (DRISDOL) 50000 units CAPS capsule Take 50,000 Units by mouth  every Saturday.    Yes [provider]   Allergies  Allergen Reactions  . Ceftin [Cefuroxime] Anaphylaxis  . Iodinated Diagnostic Agents Hives  . Nucynta [Tapentadol] Anaphylaxis  . Other Anaphylaxis, Swelling and Other (See Comments)    Pt states that she is allergic to kiwi, pineapple, pecans, walnuts and all tree nuts.     . Budesonide-Formoterol Fumarate Hives  . Cadmium Other (See Comments)    Reaction:  Unknown   . Cephalexin Itching and Swelling  . Codeine Other (See Comments)    Reaction:  Unknown   . Demerol [Meperidine] Other (See Comments)    Pt unsure  . Depakote [Divalproex Sodium] Other (See Comments)    Reaction:  Unknown   . Dilaudid [Hydromorphone Hcl] Other (See Comments)    Reaction:  Hallucinations   . Feldene [Piroxicam] Other (See Comments)    Reaction:  Unknown   . Fosamax [Alendronate] Nausea And Vomiting  . Imitrex [Sumatriptan] Other (See Comments)    Reaction:  Increased heart rate   . Latex Itching and Other (See Comments)    redness  .  Methotrexate Derivatives Other (See Comments)    Reaction:  GI bleeding   . Morphine And Related Nausea And Vomiting  . Nexium [Esomeprazole Magnesium] Other (See Comments)    Reaction:  Unknown   . Nsaids Itching and Other (See Comments)    Can take Ibuprofen  . Percocet [Oxycodone-Acetaminophen] Itching  . Protonix [Pantoprazole Sodium] Other (See Comments)    Unknown  . Relafen [Nabumetone] Other (See Comments)    Reaction:  Unknown   . Tegretol [Carbamazepine] Other (See Comments)    Reaction:  Aplastic anemia  . Topamax [Topiramate] Other (See Comments)    Reaction:  Unknown   . Tramadol Other (See Comments)    Headache  . Trazodone And Nefazodone Other (See Comments)    Reaction:  Unknown   . Vicodin [Hydrocodone-Acetaminophen] Itching  . Wellbutrin [Bupropion] Other (See Comments)    Reaction:  Unknown       Review of Systems:  Gen:  Denies  fever, sweats, chills weigh loss  HEENT: Denies blurred vision, double vision, ear pain, eye pain, hearing loss, nose bleeds, sore throat Cardiac:  No dizziness, chest pain or heaviness, chest tightness,edema, No JVD Resp:   No cough, +sputum production, +shortness of breath,+wheezing, -hemoptysis,  Gi: Denies swallowing difficulty, stomach pain, nausea or vomiting, diarrhea, constipation, bowel incontinence Gu:  Denies bladder incontinence, burning urine Ext:   Denies Joint pain, stiffness or swelling Skin: Denies  skin rash, easy bruising or bleeding or hives Endoc:  Denies polyuria, polydipsia , polyphagia or weight change Psych:   Denies depression, insomnia or hallucinations  Other:  All other systems negative    BP 132/84 (BP Location: Left Arm, Cuff Size: Normal)   Pulse 71   Ht 5\' 2"  (1.575 m)   Wt 247 lb (112 kg)   SpO2 95%   BMI 45.18 kg/m   Physical Examination:   GENERAL:NAD, no fevers, chills, no weakness no fatigue HEAD: Normocephalic, atraumatic.  EYES: Pupils equal, round, reactive to light.  Extraocular muscles intact. No scleral icterus.  MOUTH: Moist mucosal membrane. Dentition intact. No abscess noted.  EAR, NOSE, THROAT: Clear without exudates. No external lesions.  NECK: Supple. No thyromegaly. No nodules. No JVD.  PULMONARY: CTA B/L+wheezing CARDIOVASCULAR: S1 and S2. Regular rate and rhythm. No murmurs, rubs, or gallops. No edema. Pedal pulses 2+ bilaterally.  GASTROINTESTINAL: Soft, nontender, nondistended. No masses. Positive  bowel sounds. No hepatosplenomegaly.  MUSCULOSKELETAL: No swelling, clubbing, or edema. Range of motion full in all extremities.  NEUROLOGIC: Cranial nerves II through XII are intact. No gross focal neurological deficits. Sensation intact. Reflexes intact.  SKIN: No ulceration, lesions, rashes, or cyanosis. Skin warm and dry. Turgor intact.  PSYCHIATRIC: Mood, affect within normal limits. The patient is awake, alert and oriented x 3. Insight, judgment intact.  ALL OTHER ROS ARE NEGATIVE         ASSESSMENT / PLAN:  67 year old pleasant white female with morbid obesity seen today for follow-up assessment for abnormal CT scan findings that shows a left upper lobe endobronchial lesion which could be a mucous plug but also cannot rule out endobronchial mass in the setting of COPD and morbid obesity with acute symptoms of wheezing and increased work of breathing with signs and symptoms of COPD exacerbation  Chronic shortness of breath and dyspnea on exertion Multifactorial etiology including COPD deconditioned state and morbid obesity Patient currently on oxygen therapy at nighttime Patient uses this and needs this for survival   Acute COPD exacerbation Continue antibiotics as prescribed Patient needs prednisone therapy 10 mg daily for 10 days Duo nebs as needed DuoNeb will be given in office today  COPD Unable to perform pulmonary function testing at this time Continue Advair as per bribed Patient has multiple complaints and adverse reactions  to Spiriva Recommend duo nebs as needed   Abnormal CT chest with left upper lobe endobronchial lesion This could be a mucous plug but could also be underlying malignancy Patient needs bronchoscopy for airway examination and assessment We will plan to do this next week     Obesity -recommend significant weight loss -recommend changing diet  Deconditioned state -Recommend increased daily activity and exercise      Patient/Family are satisfied with Plan of action and management. All questions answered  Corrin Parker, M.D.  [facility]  Medical Director [place] Director [facility] Cardio-Pulmonary Department

## 2018-06-10 ENCOUNTER — Telehealth: Payer: Self-pay

## 2018-06-10 ENCOUNTER — Other Ambulatory Visit: Payer: Self-pay

## 2018-06-10 DIAGNOSIS — J441 Chronic obstructive pulmonary disease with (acute) exacerbation: Secondary | ICD-10-CM

## 2018-06-10 NOTE — Telephone Encounter (Signed)
Spoke to patient, she is aware of bronchoscopy times and preop apt. Nothing further needed at this time.

## 2018-06-10 NOTE — Telephone Encounter (Signed)
LM for patient to call for bronchoscopy details.  Procedure: 06/18/18@1300  Pre-op: 06/17/18@9 :30  DX: left upper lobe lesion CPT: [phone]

## 2018-06-10 NOTE — Telephone Encounter (Signed)
Patient returning our call ° °Please call back ° °

## 2018-06-10 NOTE — Telephone Encounter (Signed)
Patient returning call.

## 2018-06-10 NOTE — Telephone Encounter (Signed)
Spoke to patient, she is aware of procedure date and time and preop. No further questions at this time.

## 2018-06-15 NOTE — Telephone Encounter (Signed)
Patient calling Would like to speak with nurse in regards to preop appt Please call to discuss

## 2018-06-15 NOTE — Telephone Encounter (Signed)
I spoke to patient, she is aware of preop date and time. Nothing later was available, she understands.

## 2018-06-16 NOTE — Telephone Encounter (Signed)
06/16/18 at 8:24 am EST spoke with Danae Chen at [facility]. Procedure code [phone] is a valid and billable code which does not require PA. Call Ref # [id number]. Rhonda J Cobb

## 2018-06-17 ENCOUNTER — Other Ambulatory Visit: Payer: Self-pay

## 2018-06-17 ENCOUNTER — Encounter
Admission: RE | Admit: 2018-06-17 | Discharge: 2018-06-17 | Disposition: A | Discharge status: Home / Self Care | Payer: Medicare Other | Source: Ambulatory Visit | Attending: Internal Medicine | Admitting: Internal Medicine

## 2018-06-17 DIAGNOSIS — Z791 Long term (current) use of non-steroidal anti-inflammatories (NSAID): Secondary | ICD-10-CM | POA: Diagnosis not present

## 2018-06-17 DIAGNOSIS — F329 Major depressive disorder, single episode, unspecified: Secondary | ICD-10-CM | POA: Diagnosis not present

## 2018-06-17 DIAGNOSIS — I444 Left anterior fascicular block: Secondary | ICD-10-CM

## 2018-06-17 DIAGNOSIS — M810 Age-related osteoporosis without current pathological fracture: Secondary | ICD-10-CM | POA: Diagnosis not present

## 2018-06-17 DIAGNOSIS — Z79891 Long term (current) use of opiate analgesic: Secondary | ICD-10-CM | POA: Diagnosis not present

## 2018-06-17 DIAGNOSIS — Z01818 Encounter for other preprocedural examination: Secondary | ICD-10-CM | POA: Insufficient documentation

## 2018-06-17 DIAGNOSIS — Z87891 Personal history of nicotine dependence: Secondary | ICD-10-CM | POA: Diagnosis not present

## 2018-06-17 DIAGNOSIS — T17890A Other foreign object in other parts of respiratory tract causing asphyxiation, initial encounter: Secondary | ICD-10-CM | POA: Diagnosis not present

## 2018-06-17 DIAGNOSIS — M3500 Sicca syndrome, unspecified: Secondary | ICD-10-CM | POA: Diagnosis not present

## 2018-06-17 DIAGNOSIS — R9431 Abnormal electrocardiogram [ECG] [EKG]: Secondary | ICD-10-CM | POA: Insufficient documentation

## 2018-06-17 DIAGNOSIS — Z96651 Presence of right artificial knee joint: Secondary | ICD-10-CM | POA: Diagnosis not present

## 2018-06-17 DIAGNOSIS — R918 Other nonspecific abnormal finding of lung field: Secondary | ICD-10-CM | POA: Diagnosis present

## 2018-06-17 DIAGNOSIS — Z7951 Long term (current) use of inhaled steroids: Secondary | ICD-10-CM | POA: Diagnosis not present

## 2018-06-17 DIAGNOSIS — X58XXXA Exposure to other specified factors, initial encounter: Secondary | ICD-10-CM | POA: Diagnosis not present

## 2018-06-17 DIAGNOSIS — I10 Essential (primary) hypertension: Secondary | ICD-10-CM | POA: Insufficient documentation

## 2018-06-17 DIAGNOSIS — Z8673 Personal history of transient ischemic attack (TIA), and cerebral infarction without residual deficits: Secondary | ICD-10-CM | POA: Diagnosis not present

## 2018-06-17 DIAGNOSIS — J441 Chronic obstructive pulmonary disease with (acute) exacerbation: Secondary | ICD-10-CM | POA: Diagnosis not present

## 2018-06-17 DIAGNOSIS — M797 Fibromyalgia: Secondary | ICD-10-CM | POA: Diagnosis not present

## 2018-06-17 DIAGNOSIS — Z9981 Dependence on supplemental oxygen: Secondary | ICD-10-CM | POA: Diagnosis not present

## 2018-06-17 DIAGNOSIS — I2510 Atherosclerotic heart disease of native coronary artery without angina pectoris: Secondary | ICD-10-CM | POA: Diagnosis not present

## 2018-06-17 DIAGNOSIS — D649 Anemia, unspecified: Secondary | ICD-10-CM | POA: Diagnosis not present

## 2018-06-17 DIAGNOSIS — Z79899 Other long term (current) drug therapy: Secondary | ICD-10-CM | POA: Diagnosis not present

## 2018-06-17 DIAGNOSIS — E785 Hyperlipidemia, unspecified: Secondary | ICD-10-CM | POA: Diagnosis not present

## 2018-06-17 DIAGNOSIS — K219 Gastro-esophageal reflux disease without esophagitis: Secondary | ICD-10-CM | POA: Diagnosis not present

## 2018-06-17 DIAGNOSIS — Z7982 Long term (current) use of aspirin: Secondary | ICD-10-CM | POA: Diagnosis not present

## 2018-06-17 DIAGNOSIS — F419 Anxiety disorder, unspecified: Secondary | ICD-10-CM | POA: Diagnosis not present

## 2018-06-17 DIAGNOSIS — E559 Vitamin D deficiency, unspecified: Secondary | ICD-10-CM | POA: Diagnosis not present

## 2018-06-17 HISTORY — DX: Pneumonia, unspecified organism: J18.9

## 2018-06-17 NOTE — Patient Instructions (Addendum)
Your procedure is scheduled on: Thursday 06/18/18.  Report to DAY SURGERY DEPARTMENT LOCATED ON 2ND FLOOR MEDICAL MALL ENTRANCE. To find out your arrival time please call [phone] between [place].    Remember: Instructions that are not followed completely may result in serious medical risk, up to and including death, or upon the discretion of your surgeon and anesthesiologist your surgery may need to be rescheduled.     _X__ 1. Do not eat food after midnight the night before your procedure.                 No gum chewing or hard candies. You may drink clear liquids up to 2 hours                 before you are scheduled to arrive for your surgery- DO NOT drink clear                 liquids within 2 hours of the start of your surgery.                 Clear Liquids include:  water, apple juice without pulp, clear carbohydrate                 drink such as Clearfast or Gatorade, Black Coffee or Tea (Do not add                 anything to coffee or tea).  __X__2.  On the morning of surgery brush your teeth with toothpaste and water, you may rinse your mouth with mouthwash if you wish.  Do not swallow any toothpaste or mouthwash.    __X__3 .  Notify your doctor if there is any change in your medical condition      (cold, fever, infections).      Do not wear jewelry, make-up, hairpins, clips or nail polish. Do not wear lotions, powders, or perfumes.  Do not shave 48 hours prior to surgery. Men may shave face and neck. Do not bring valuables to the hospital.    [facility] is not responsible for any belongings or valuables.   Contacts, dentures/partials or body piercings may not be worn into surgery. Bring a case for your contacts, glasses or hearing aids, a denture cup will be supplied.    Patients discharged the day of surgery will not be allowed to drive home.     Please read over the following fact sheets that you were given:   MRSA Information    __X__ Take these  medicines the morning of surgery with A SIP OF WATER:     1. metaxalone (SKELAXIN) 400 MG tablet  2. citalopram (CELEXA) 20 MG tablet  3. famotidine (PEPCID) 20 MG tablet  4. lansoprazole (PREVACID) 30 MG capsule  5. predniSONE (DELTASONE) 10 MG tablet  6. Fluticasone-Salmeterol (ADVAIR) 250-50 MCG/DOSE AEPB  7. ipratropium-albuterol (DUONEB) 0.5-2.5 (3) MG/3ML SOLN  8. albuterol (PROVENTIL HFA;VENTOLIN HFA) 108 (90 Base) MCG/ACT inhaler     __X__ Use CHG Soap/SAGE wipes as directed   _ X___ Use inhalers on the day of surgery. Also bring the inhaler with you to the hospital on the morning of surgery.   __X__ Stop Anti-inflammatories 7 days before surgery such as Advil, Ibuprofen, Motrin, BC or Goodies Powder, Naprosyn, Naproxen, Aleve, Aspirin, Meloxicam. May take Tylenol if needed for pain or discomfort.   __X__ Stop all herbal supplements today & don't take them tomorrow.

## 2018-06-18 ENCOUNTER — Ambulatory Visit: Payer: Medicare Other | Admitting: Anesthesiology

## 2018-06-18 ENCOUNTER — Other Ambulatory Visit: Payer: Self-pay

## 2018-06-18 ENCOUNTER — Encounter
Admission: RE | Disposition: A | Discharge status: Home / Self Care | Payer: Self-pay | Source: Ambulatory Visit | Attending: Internal Medicine

## 2018-06-18 ENCOUNTER — Ambulatory Visit
Admission: RE | Admit: 2018-06-18 | Discharge: 2018-06-18 | Disposition: A | Discharge status: Home / Self Care | Payer: Medicare Other | Source: Ambulatory Visit | Attending: Internal Medicine | Admitting: Internal Medicine

## 2018-06-18 DIAGNOSIS — Z79899 Other long term (current) drug therapy: Secondary | ICD-10-CM | POA: Insufficient documentation

## 2018-06-18 DIAGNOSIS — K219 Gastro-esophageal reflux disease without esophagitis: Secondary | ICD-10-CM | POA: Insufficient documentation

## 2018-06-18 DIAGNOSIS — Z791 Long term (current) use of non-steroidal anti-inflammatories (NSAID): Secondary | ICD-10-CM | POA: Insufficient documentation

## 2018-06-18 DIAGNOSIS — J9809 Other diseases of bronchus, not elsewhere classified: Secondary | ICD-10-CM

## 2018-06-18 DIAGNOSIS — M810 Age-related osteoporosis without current pathological fracture: Secondary | ICD-10-CM | POA: Insufficient documentation

## 2018-06-18 DIAGNOSIS — J441 Chronic obstructive pulmonary disease with (acute) exacerbation: Secondary | ICD-10-CM | POA: Insufficient documentation

## 2018-06-18 DIAGNOSIS — X58XXXA Exposure to other specified factors, initial encounter: Secondary | ICD-10-CM | POA: Insufficient documentation

## 2018-06-18 DIAGNOSIS — Z8673 Personal history of transient ischemic attack (TIA), and cerebral infarction without residual deficits: Secondary | ICD-10-CM | POA: Insufficient documentation

## 2018-06-18 DIAGNOSIS — F329 Major depressive disorder, single episode, unspecified: Secondary | ICD-10-CM | POA: Insufficient documentation

## 2018-06-18 DIAGNOSIS — D649 Anemia, unspecified: Secondary | ICD-10-CM | POA: Insufficient documentation

## 2018-06-18 DIAGNOSIS — Z7982 Long term (current) use of aspirin: Secondary | ICD-10-CM | POA: Insufficient documentation

## 2018-06-18 DIAGNOSIS — Z9981 Dependence on supplemental oxygen: Secondary | ICD-10-CM | POA: Insufficient documentation

## 2018-06-18 DIAGNOSIS — Z87891 Personal history of nicotine dependence: Secondary | ICD-10-CM | POA: Insufficient documentation

## 2018-06-18 DIAGNOSIS — T17800A Unspecified foreign body in other parts of respiratory tract causing asphyxiation, initial encounter: Secondary | ICD-10-CM

## 2018-06-18 DIAGNOSIS — F419 Anxiety disorder, unspecified: Secondary | ICD-10-CM | POA: Insufficient documentation

## 2018-06-18 DIAGNOSIS — E785 Hyperlipidemia, unspecified: Secondary | ICD-10-CM | POA: Insufficient documentation

## 2018-06-18 DIAGNOSIS — M797 Fibromyalgia: Secondary | ICD-10-CM | POA: Insufficient documentation

## 2018-06-18 DIAGNOSIS — Z7951 Long term (current) use of inhaled steroids: Secondary | ICD-10-CM | POA: Insufficient documentation

## 2018-06-18 DIAGNOSIS — M3500 Sicca syndrome, unspecified: Secondary | ICD-10-CM | POA: Insufficient documentation

## 2018-06-18 DIAGNOSIS — Z79891 Long term (current) use of opiate analgesic: Secondary | ICD-10-CM | POA: Insufficient documentation

## 2018-06-18 DIAGNOSIS — I2510 Atherosclerotic heart disease of native coronary artery without angina pectoris: Secondary | ICD-10-CM | POA: Insufficient documentation

## 2018-06-18 DIAGNOSIS — I10 Essential (primary) hypertension: Secondary | ICD-10-CM | POA: Insufficient documentation

## 2018-06-18 DIAGNOSIS — T17890A Other foreign object in other parts of respiratory tract causing asphyxiation, initial encounter: Secondary | ICD-10-CM | POA: Diagnosis not present

## 2018-06-18 DIAGNOSIS — E559 Vitamin D deficiency, unspecified: Secondary | ICD-10-CM | POA: Insufficient documentation

## 2018-06-18 DIAGNOSIS — Z96651 Presence of right artificial knee joint: Secondary | ICD-10-CM | POA: Insufficient documentation

## 2018-06-18 HISTORY — PX: FLEXIBLE BRONCHOSCOPY: SHX5094

## 2018-06-18 SURGERY — BRONCHOSCOPY, FLEXIBLE
Anesthesia: General | Laterality: Left

## 2018-06-18 MED ORDER — PROPOFOL 10 MG/ML IV BOLUS
INTRAVENOUS | Status: AC
  Filled 2018-06-18: qty 20

## 2018-06-18 MED ORDER — LACTATED RINGERS IV SOLN
INTRAVENOUS | Status: DC
  Administered 2018-06-18: 12:00:00 via INTRAVENOUS

## 2018-06-18 MED ORDER — FENTANYL CITRATE (PF) 100 MCG/2ML IJ SOLN: 25.0000 ug | INTRAMUSCULAR | Status: DC | PRN

## 2018-06-18 MED ORDER — MIDAZOLAM HCL 2 MG/2ML IJ SOLN
INTRAMUSCULAR | Status: AC
  Filled 2018-06-18: qty 2

## 2018-06-18 MED ORDER — ROCURONIUM BROMIDE 100 MG/10ML IV SOLN
INTRAVENOUS | Status: DC | PRN
  Administered 2018-06-18: 5 mg via INTRAVENOUS

## 2018-06-18 MED ORDER — FENTANYL CITRATE (PF) 100 MCG/2ML IJ SOLN
INTRAMUSCULAR | Status: AC
  Filled 2018-06-18: qty 2

## 2018-06-18 MED ORDER — ALBUTEROL SULFATE (2.5 MG/3ML) 0.083% IN NEBU
INHALATION_SOLUTION | RESPIRATORY_TRACT | Status: AC
  Administered 2018-06-18: 2.5 mg via RESPIRATORY_TRACT
  Filled 2018-06-18: qty 3

## 2018-06-18 MED ORDER — PROMETHAZINE HCL 25 MG/ML IJ SOLN: 6.2500 mg | INTRAMUSCULAR | Status: DC | PRN

## 2018-06-18 MED ORDER — ROCURONIUM BROMIDE 50 MG/5ML IV SOLN
INTRAVENOUS | Status: AC
  Filled 2018-06-18: qty 1

## 2018-06-18 MED ORDER — SUCCINYLCHOLINE CHLORIDE 20 MG/ML IJ SOLN
INTRAMUSCULAR | Status: AC
  Filled 2018-06-18: qty 1

## 2018-06-18 MED ORDER — LIDOCAINE HCL (CARDIAC) PF 100 MG/5ML IV SOSY
PREFILLED_SYRINGE | INTRAVENOUS | Status: DC | PRN
  Administered 2018-06-18: 60 mg via INTRAVENOUS

## 2018-06-18 MED ORDER — SUGAMMADEX SODIUM 200 MG/2ML IV SOLN
INTRAVENOUS | Status: DC | PRN
  Administered 2018-06-18: 200 mg via INTRAVENOUS

## 2018-06-18 MED ORDER — SUCCINYLCHOLINE CHLORIDE 20 MG/ML IJ SOLN
INTRAMUSCULAR | Status: DC | PRN
  Administered 2018-06-18: 100 mg via INTRAVENOUS

## 2018-06-18 MED ORDER — ALBUTEROL SULFATE (2.5 MG/3ML) 0.083% IN NEBU
2.5000 mg | INHALATION_SOLUTION | Freq: Once | RESPIRATORY_TRACT | Status: AC
  Administered 2018-06-18: 2.5 mg via RESPIRATORY_TRACT

## 2018-06-18 MED ORDER — FENTANYL CITRATE (PF) 100 MCG/2ML IJ SOLN
INTRAMUSCULAR | Status: DC | PRN
  Administered 2018-06-18: 50 ug via INTRAVENOUS

## 2018-06-18 MED ORDER — FAMOTIDINE 20 MG PO TABS
ORAL_TABLET | ORAL | Status: AC
  Filled 2018-06-18: qty 1

## 2018-06-18 MED ORDER — DEXAMETHASONE SODIUM PHOSPHATE 10 MG/ML IJ SOLN
INTRAMUSCULAR | Status: AC
  Filled 2018-06-18: qty 1

## 2018-06-18 MED ORDER — PROPOFOL 10 MG/ML IV BOLUS
INTRAVENOUS | Status: DC | PRN
  Administered 2018-06-18: 150 mg via INTRAVENOUS

## 2018-06-18 MED ORDER — ONDANSETRON HCL 4 MG/2ML IJ SOLN
INTRAMUSCULAR | Status: DC | PRN
  Administered 2018-06-18: 4 mg via INTRAVENOUS

## 2018-06-18 MED ORDER — LIDOCAINE HCL (PF) 2 % IJ SOLN
INTRAMUSCULAR | Status: AC
  Filled 2018-06-18: qty 10

## 2018-06-18 NOTE — Op Note (Signed)
PROCEDURE: BRONCHOSCOPY Therapeutic Aspiration of Tracheobronchial Tree  PROCEDURE DATE: 06/18/2018  TIME:  NAME:  Rachel Shaffer  DOB:08-Aug-1950  MRN: [id number]  Code Status History    Date Active Date Inactive Code Status Order ID Comments User Context   11/14/2016 1631 11/15/2016 1412 Full Code [id number]  Corky Mull, MD Inpatient   08/15/2015 1534 08/16/2015 1804 Full Code [id number]  Corky Mull, MD Inpatient   05/12/2015 0047 05/16/2015 1355 Full Code [id number]  Lance Coon, MD Inpatient   04/26/2015 1252 04/29/2015 1613 Full Code [id number]  Leanor Kail, MD Inpatient          Indications/Preliminary Diagnosis: endobronchial lesions  Consent: (Place X beside choice/s below)  The benefits, risks and possible complications of the procedure were        explained to:  _x__ patient  _x__ patient's family  ___ other:___________  who verbalized understanding and gave:  ___ verbal  ___ written  _x__ verbal and written  ___ telephone  ___ other:________ consent.      Unable to obtain consent; procedure performed on emergent basis.     Other:       PRESEDATION ASSESSMENT: History and Physical has been performed. Patient meds and allergies have been reviewed. Presedation airway examination has been performed and documented. Baseline vital signs, sedation score, oxygenation status, and cardiac rhythm were reviewed. Patient was deemed to be in satisfactory condition to undergo the procedure.         PROCEDURE DETAILS: Timeout performed and correct patient, name, & ID confirmed. Following prep per Pulmonary policy, appropriate sedation was administered. The Bronchoscope was inserted in to oral cavity with bite block in place. Therapeutic aspiration of Tracheobronchial tree was performed.  Airway exam proceeded with findings, technical procedures, and specimen collection as noted below. At the end of exam the scope was withdrawn without incident. Impression and Plan as noted below.            Airway Prep (Place X beside choice below)   1% Transtracheal Lidocaine Anesthetization 7 cc   Patient prepped per Bronchoscopy Lab Policy       Insertion Route (Place X beside choice below)   Nasal   Oral  x Endotracheal Tube   Tracheostomy   INTRAPROCEDURE MEDICATIONS:  Sedative/Narcotic Amt Dose   Versed  mg   Fentanyl  mcg  Diprivan  mg       Medication Amt Dose  Medication Amt Dose  Lidocaine 1%  cc  Epinephrine 1:10,000 sol  cc  Xylocaine 4%  cc  Cocaine  cc   TECHNICAL PROCEDURES: (Place X beside choice below)   Procedures  Description  x  None Mild-moderate mucus plugs and somewhat thick secretions    Electrocautery     Cryotherapy     Balloon Dilatation     Bronchography     Stent Placement     Therapeutic Aspiration     Laser/Argon Plasma    Brachytherapy Catheter Placement    Foreign Body Removal         SPECIMENS (Sites): (Place X beside choice below)  Specimens Description  x No Specimens Obtained     Washings    Lavage    Biopsies    Fine Needle Aspirates    Brushings    Sputum    FINDINGS:  Thick mucus plugs with secretions through out  all air way segments There was NO massess or endobronchial lesions seen. There is evidence of mild inflammation through out  Airways  Therapeutic aspiration of tracheobronchial tree of secretions   ESTIMATED BLOOD LOSS: none COMPLICATIONS/RESOLUTION: none      IMPRESSION:POST-PROCEDURE DX: mucus plugs, NO  MASSES VISUALIZED    RECOMMENDATION/PLAN:   Follow up in office as scheduled   Raymonda Pell Patricia Pesa, M.D.  [facility]  Medical Director [place] Director [place] Department

## 2018-06-18 NOTE — Transfer of Care (Signed)
Immediate Anesthesia Transfer of Care Note  Patient: Rachel Shaffer  Procedure(s) Performed: FLEXIBLE BRONCHOSCOPY (Left )  Patient Location: PACU  Anesthesia Type:General  Level of Consciousness: sedated  Airway & Oxygen Therapy: Patient Spontanous Breathing and Patient connected to face mask oxygen  Post-op Assessment: Report given to RN and Post -op Vital signs reviewed and stable  Post vital signs: Reviewed and stable  Last Vitals:  Vitals Value Taken Time  BP 116/58 06/18/2018  1:42 PM  Temp    Pulse 70 06/18/2018  1:43 PM  Resp 17 06/18/2018  1:43 PM  SpO2 100 % 06/18/2018  1:43 PM  Vitals shown include unvalidated device data.  Last Pain:  Vitals:   06/18/18 1151  TempSrc: Temporal  PainSc: 0-No pain         Complications: No apparent anesthesia complications

## 2018-06-18 NOTE — Anesthesia Preprocedure Evaluation (Signed)
Anesthesia Evaluation  Patient identified by MRN, date of birth, ID band Patient awake    Reviewed: Allergy & Precautions, H&P , NPO status , Patient's Chart, lab work & pertinent test results  History of Anesthesia Complications (+) PONV, POST - OP SPINAL HEADACHE, Family history of anesthesia reaction and history of anesthetic complications  Airway Mallampati: III  TM Distance: <3 FB Neck ROM: limited    Dental  (+) Chipped, Poor Dentition, Missing   Pulmonary shortness of breath and with exertion, asthma , sleep apnea , pneumonia, COPD,  oxygen dependent, former smoker,           Cardiovascular Exercise Tolerance: Good hypertension, (-) angina+ CAD  (-) Past MI      Neuro/Psych  Headaches, Seizures -, Well Controlled,  PSYCHIATRIC DISORDERS  Neuromuscular disease CVA    GI/Hepatic Neg liver ROS, GERD  Medicated and Controlled,  Endo/Other  negative endocrine ROSHypothyroidism   Renal/GU      Musculoskeletal  (+) Arthritis , Fibromyalgia -  Abdominal   Peds  Hematology negative hematology ROS (+)   Anesthesia Other Findings Past Medical History: No date: Anemia No date: Anxiety No date: Aortic regurgitation No date: Arthritis No date: Asthma No date: Blood transfusion without reported diagnosis No date: Bronchitis, chronic (HCC) No date: Cataract No date: Complication of anesthesia     Comment:  spinal headache  blood patch.  "Blood pressure drops" No date: COPD (chronic obstructive pulmonary disease) (HCC)     Comment:  "mild" No date: Coronary artery disease No date: DDD (degenerative disc disease), cervical     Comment:  all of back No date: Depression No date: DJD (degenerative joint disease) No date: Dyspnea     Comment:  with exertion No date: Family history of adverse reaction to anesthesia     Comment:  sister scoliosis and trouble with some anesthesia-               hallunication No date:  Fibromyalgia No date: Folic acid deficiency No date: GERD (gastroesophageal reflux disease) No date: History of kidney stones     Comment:  passed No date: Hyperlipidemia No date: Hypertension No date: Hypothyroidism     Comment:  not on medication No date: Osteoporosis No date: Pain     Comment:  non cardiac chest pain No date: Pneumonia     Comment:  Twice in 2019 No date: PONV (postoperative nausea and vomiting) No date: Seizures ([place])     Comment:  prior to brain surgery No date: Sjogren's disease ([place]) No date: Spinal headache No date: Stroke [facility])     Comment:  residual- headache No date: Vasculitis ([place]) No date: Vitamin D deficiency  Past Surgical History: No date: ABDOMINAL HYSTERECTOMY No date: APPENDECTOMY 01/01/2005: BRAIN SURGERY     Comment:  menniogonma 1992: BREAST BIOPSY; Left     Comment:  negative 1992: BREAST LUMPECTOMY; Left No date: CHOLECYSTECTOMY No date: COLONOSCOPY No date: CYSTOSCOPY     Comment:  urtheral polyps No date: ESOPHAGOGASTRODUODENOSCOPY 05/15/2015: ESOPHAGOGASTRODUODENOSCOPY; N/A     Comment:  Procedure: ESOPHAGOGASTRODUODENOSCOPY (EGD);  Surgeon:               Hulen Luster, MD;  Location: [facility] ENDOSCOPY;  Service:               Endoscopy;  Laterality: N/A; 11/14/2016: HARDWARE REMOVAL; Left     Comment:  Procedure: HARDWARE REMOVAL- SHOULDER;  Surgeon: Marshall Cork  Poggi, MD;  Location: [facility] ORS;  Service: Orthopedics;                Laterality: Left; 08/05/2017: HARDWARE REMOVAL; Left     Comment:  Procedure: HARDWARE REMOVAL;  Surgeon: Altamese Geneva,               MD;  Location: [place];  Service: Orthopedics;  Laterality:              Left; No date: KNEE ARTHROSCOPY; Left No date: OOPHORECTOMY; Bilateral 08/15/2015: ORIF HUMERUS FRACTURE; Left     Comment:  Procedure: OPEN REDUCTION INTERNAL FIXATION (ORIF)               PROXIMAL HUMERUS FRACTURE;  Surgeon: Corky Mull, MD;                Location: [facility] ORS;  Service:  Orthopedics;  Laterality:               Left; 11/14/2016: ORIF HUMERUS FRACTURE; Left     Comment:  Procedure: OPEN REDUCTION INTERNAL FIXATION (ORIF)               PROXIMAL HUMERUS FRACTURE;  Surgeon: Corky Mull, MD;                Location: [facility] ORS;  Service: Orthopedics;  Laterality:               Left; 08/05/2017: ORIF HUMERUS FRACTURE; Left     Comment:  Procedure: OPEN REDUCTION INTERNAL FIXATION (ORIF) LEFT               PROXIMAL HUMERUS;  Surgeon: Altamese Millstone, MD;                Location: [place];  Service: Orthopedics;  Laterality:               Left; No date: STOMACH SURGERY     Comment:  gastroplasty 1959: TONSILLECTOMY 04/26/2015: TOTAL KNEE ARTHROPLASTY; Right     Comment:  Procedure: TOTAL KNEE ARTHROPLASTY;  Surgeon: Leanor Kail, MD;  Location: [facility] ORS;  Service: Orthopedics;              Laterality: Right;  BMI    Body Mass Index:  48.24 kg/m      Reproductive/Obstetrics negative OB ROS                             Anesthesia Physical Anesthesia Plan  ASA: III  Anesthesia Plan: General ETT   Post-op Pain Management:    Induction: Intravenous  PONV Risk Score and Plan: Ondansetron, Dexamethasone, Midazolam and Treatment may vary due to age or medical condition  Airway Management Planned: Oral ETT  Additional Equipment:   Intra-op Plan:   Post-operative Plan: Extubation in OR  Informed Consent: I have reviewed the patients History and Physical, chart, labs and discussed the procedure including the risks, benefits and alternatives for the proposed anesthesia with the patient or authorized representative who has indicated his/her understanding and acceptance.   Dental Advisory Given  Plan Discussed with: Anesthesiologist, CRNA and Surgeon  Anesthesia Plan Comments: (Patient consented for risks of anesthesia including but not limited to:  - adverse reactions to medications - damage to teeth, lips or other  oral mucosa - sore throat or hoarseness - Damage to heart, brain, lungs or loss of  life  Patient voiced understanding.)        Anesthesia Quick Evaluation

## 2018-06-18 NOTE — Anesthesia Postprocedure Evaluation (Signed)
Anesthesia Post Note  Patient: Rachel Shaffer  Procedure(s) Performed: FLEXIBLE BRONCHOSCOPY (Left )  Patient location during evaluation: PACU Anesthesia Type: General Level of consciousness: awake and alert Pain management: pain level controlled Vital Signs Assessment: post-procedure vital signs reviewed and stable Respiratory status: spontaneous breathing, nonlabored ventilation, respiratory function stable and patient connected to nasal cannula oxygen Cardiovascular status: blood pressure returned to baseline and stable Postop Assessment: no apparent nausea or vomiting Anesthetic complications: no     Last Vitals:  Vitals:   06/18/18 1431 06/18/18 1440  BP: 114/71   Pulse: 73 67  Resp: 16 15  Temp:    SpO2: 93% 100%    Last Pain:  Vitals:   06/18/18 1424  TempSrc:   PainSc: 0-No pain                 Precious Haws [place]

## 2018-06-18 NOTE — Interval H&P Note (Signed)
History and Physical Interval Note:  06/18/2018 12:45 PM  Rachel Shaffer  has presented today for surgery, with the diagnosis of LEFT UPPER LOBE LESION  The various methods of treatment have been discussed with the patient and family. After consideration of risks, benefits and other options for treatment, the patient has consented to  Procedure(s): FLEXIBLE BRONCHOSCOPY (Left) as a surgical intervention .  The patient's history has been reviewed, patient examined, no change in status, stable for surgery.  I have reviewed the patient's chart and labs.  Questions were answered to the patient's satisfaction.     The Risks and Benefits of the Bronchoscopy procedure with Bronchoscopy  were explained to patient/family.  I have discussed the risk for Acute Bleeding, increased chance of Infection, increased chance of Respiratory Failure and Cardiac Arrest, increased chance of pneumothorax and collapsed lung, as well as increased Stroke and Death.  I have also explained to avoid all types of NSAIDs 1 week prior to procedure date  to decrease chance of bleeding, and also to avoid food and drinks the midnight prior to procedure.  The procedure consists of a video camera with a light source to be placed and inserted  into the lungs to  look for abnormal tissue and to obtain tissue samples by using needle and biopsy tools.  The patient/family understand the risks and benefits and have agreed to proceed with procedure.  Flora Lipps

## 2018-06-18 NOTE — Anesthesia Procedure Notes (Signed)
Procedure Name: Intubation Date/Time: 06/18/2018 1:18 PM Performed by: Dionne Bucy, CRNA Pre-anesthesia Checklist: Patient identified, Patient being monitored, Timeout performed, Emergency Drugs available and Suction available Patient Re-evaluated:Patient Re-evaluated prior to induction Oxygen Delivery Method: Circle system utilized Preoxygenation: Pre-oxygenation with 100% oxygen Induction Type: IV induction Ventilation: Mask ventilation without difficulty Laryngoscope Size: Mac and 4 Grade View: Grade I Tube type: Oral Tube size: 8.5 mm Number of attempts: 1 Airway Equipment and Method: Stylet Placement Confirmation: ETT inserted through vocal cords under direct vision,  positive ETCO2 and breath sounds checked- equal and bilateral Secured at: 22 cm Tube secured with: Tape Dental Injury: Teeth and Oropharynx as per pre-operative assessment

## 2018-06-18 NOTE — Discharge Instructions (Signed)
Flexible Bronchoscopy, Care After These instructions give you information on caring for yourself after your procedure. Your doctor may also give you more specific instructions. Call your doctor if you have any problems or questions after your procedure. Follow these instructions at home:  Do not eat or drink anything for 2 hours after your procedure. If you try to eat or drink before the medicine wears off, food or drink could go into your lungs. You could also burn yourself.  After 2 hours have passed and when you can cough and gag normally, you may eat soft food and drink liquids slowly.  The day after the test, you may eat your normal diet.  You may do your normal activities.  Keep all doctor visits. Get help right away if:  You get more and more short of breath.  You get light-headed.  You feel like you are going to pass out (faint).  You have chest pain.  You have new problems that worry you.  You cough up more than a little blood.  You cough up more blood than before. This information is not intended to replace advice given to you by your health care provider. Make sure you discuss any questions you have with your health care provider. Document Released: 04/28/2009 Document Revised: 12/07/2015 Document Reviewed: 03/05/2013 Elsevier Interactive Patient Education  2017 [place]   1) The drugs that you were given will stay in your system until tomorrow so for the next 24 hours you should not:  A) Drive an automobile B) Make any legal decisions C) Drink any alcoholic beverage   2) You may resume regular meals tomorrow.  Today it is better to start with liquids and gradually work up to solid foods.  You may eat anything you prefer, but it is better to start with liquids, then soup and crackers, and gradually work up to solid foods.   3) Please notify your doctor immediately if you have any unusual bleeding, trouble  breathing, redness and pain at the surgery site, drainage, fever, or pain not relieved by medication.    4) Additional Instructions:        Please contact your physician with any problems or Same Day Surgery at [phone], Monday through Friday 6 am to 4 pm, or [place] at [facility] number at [phone].

## 2018-06-18 NOTE — Anesthesia Post-op Follow-up Note (Signed)
Anesthesia QCDR form completed.        

## 2018-06-19 ENCOUNTER — Encounter: Payer: Self-pay | Admitting: Internal Medicine

## 2018-07-06 ENCOUNTER — Telehealth: Payer: Self-pay | Admitting: Internal Medicine

## 2018-07-06 NOTE — Telephone Encounter (Signed)
Returned call to patient and notified DK out of office until after 1st of year. Patient advised to contact PCP for another cough syrup. She is requesting a different cough syrup with codeine be sent. Nothing further needed.

## 2018-07-20 ENCOUNTER — Encounter: Payer: Self-pay | Admitting: Internal Medicine

## 2018-07-20 ENCOUNTER — Ambulatory Visit (INDEPENDENT_AMBULATORY_CARE_PROVIDER_SITE_OTHER): Payer: Medicare Other | Admitting: Internal Medicine

## 2018-07-20 VITALS — BP 126/84 | HR 73 | Ht 62.0 in | Wt 249.2 lb

## 2018-07-20 DIAGNOSIS — J449 Chronic obstructive pulmonary disease, unspecified: Secondary | ICD-10-CM

## 2018-07-20 MED ORDER — FLUTTER DEVI: 0 refills | Status: AC

## 2018-07-20 MED ORDER — PREDNISONE 20 MG PO TABS: 10.0000 mg | ORAL_TABLET | Freq: Every day | ORAL | 1 refills | Status: DC

## 2018-07-20 MED ORDER — GUAIFENESIN-CODEINE 100-10 MG/5ML PO SOLN: 5.0000 mL | ORAL | 0 refills | Status: AC | PRN

## 2018-07-20 MED ORDER — AZITHROMYCIN 250 MG PO TABS: ORAL_TABLET | ORAL | 0 refills | Status: AC

## 2018-07-20 NOTE — Progress Notes (Addendum)
Name: Rachel Shaffer MRN: [id number] DOB: 07/05/1951     CONSULTATION DATE: 9.12.19 REFERRING MD : whitaker  CHIEF COMPLAINT: follow up SOB  STUDIES:   03/19/2018 CT chest Independently reviewed by Me today Patient has a right upper lobe groundglass opacification she also has a right lower lobe groundglass opacification patient not very prominent recommend repeat CT chest in 2 months   11.19.19 CT chest Independently reviewed by Me today LUL airway  Obstruction mucus plug vs endobronchial lesion  HISTORY OF PRESENT ILLNESS: +increased WOB +wheezing +cough productive  +feeling bad  +cavity of tooth Poor dentition  S/p Bronch reveals mucus plugs but no endobronchial lesions   +mild COPD exacerbation   Review of Systems:  Gen:  Denies  fever, sweats, chills weigh loss  HEENT: Denies blurred vision, double vision, ear pain, eye pain, hearing loss, nose bleeds, sore throat Cardiac:  No dizziness, chest pain or heaviness, chest tightness,edema, No JVD Resp:   No cough, +sputum production, +shortness of breath,+wheezing, -hemoptysis,  Gi: Denies swallowing difficulty, stomach pain, nausea or vomiting, diarrhea, constipation, bowel incontinence Gu:  Denies bladder incontinence, burning urine Ext:   Denies Joint pain, stiffness or swelling Skin: Denies  skin rash, easy bruising or bleeding or hives Endoc:  Denies polyuria, polydipsia , polyphagia or weight change Psych:   Denies depression, insomnia or hallucinations  Other:  All other systems negative     BP 126/84   Pulse 73   Ht 5\' 2"  (1.575 m)   Wt 249 lb 3.2 oz (113 kg)   SpO2 96%   BMI 45.58 kg/m   Physical Examination:   GENERAL:NAD, no fevers, chills, no weakness no fatigue HEAD: Normocephalic, atraumatic.  EYES: Pupils equal, round, reactive to light. Extraocular muscles intact. No scleral icterus.  MOUTH: Moist mucosal membrane. Dentition intact. No abscess noted.  EAR, NOSE, THROAT: Clear without  exudates. No external lesions.  NECK: Supple. No thyromegaly. No nodules. No JVD.  PULMONARY: CTA B/L +wheezing,  CARDIOVASCULAR: S1 and S2. Regular rate and rhythm. No murmurs, rubs, or gallops. No edema. Pedal pulses 2+ bilaterally.  GASTROINTESTINAL: Soft, nontender, nondistended. No masses. Positive bowel sounds. No hepatosplenomegaly.  MUSCULOSKELETAL: No swelling, clubbing, or edema. Range of motion full in all extremities.  NEUROLOGIC: Cranial nerves II through XII are intact. No gross focal neurological deficits. Sensation intact. Reflexes intact.  SKIN: No ulceration, lesions, rashes, or cyanosis. Skin warm and dry. Turgor intact.  PSYCHIATRIC: Mood, affect within normal limits. The patient is awake, alert and oriented x 3. Insight, judgment intact.  ALL OTHER ROS ARE NEGATIVE             ASSESSMENT / PLAN:  68 year old pleasant white female with morbid obesity seen today for follow-up COPD Patient had mucous plugging and therapeutic aspiration of tracheobronchial tree Patient has signs symptoms of chronic bronchitis Has COPD exacerbation at this time   Acute mild COPD exacerbation Prednisone 10 mg for 5 days Z-Pak Robitussin with codeine for cough Duo nebs as needed  Chronic shortness of breath and dyspnea on exertion Multifactorial etiology related to COPD and deconditioned state and morbid obesity Continue oxygen therapy as prescribed at nighttime Patient uses and needs this for survival   COPD Continue Advair as prescribed Duo nebs as needed She has had multiple adverse reactions to Spiriva  Obesity -recommend significant weight loss -recommend changing diet  Deconditioned state -Recommend increased daily activity and exercise   Chronic bronchitis and chronic productive cough Recommend incentive spirometry 10  to 15/day  recommend flutter valve 10-15 times per day   Patient/Family are satisfied with Plan of action and management. All questions  answered Follow-up in 6 months   [name], M.D.  [facility]  Medical Director [place] Director [facility] Cardio-Pulmonary Department

## 2018-07-20 NOTE — Patient Instructions (Addendum)
Incentive Spirometry 10-15 times  Flutter Valve 10-15 times per day  Prednisone 10 mg daily for 5 days  Z Pak  Robitussin with Codeine as needed

## 2018-08-17 IMAGING — RF DG C-ARM 61-120 MIN
1 series · 8 of 8 positions shown · non-contrast
Comparison: CT 07/11/2017

CLINICAL DATA: Hardware removal. Open reduction internal fixation
of left proximal humerus.

EXAM:
DG C-ARM 61-120 MIN; LEFT HUMERUS - 2+ VIEW

[Series 1: run · 8 of 8 slices shown]
[im 1/8]
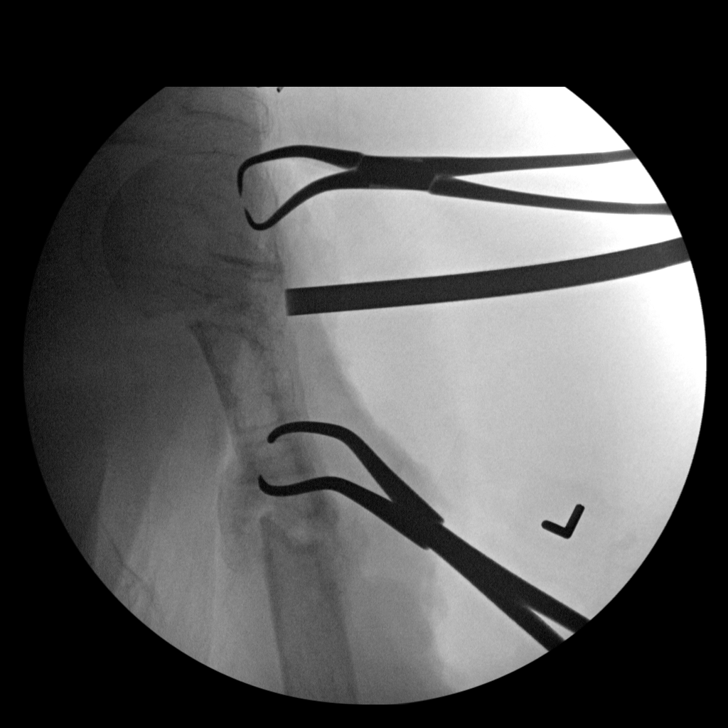
[im 2/8]
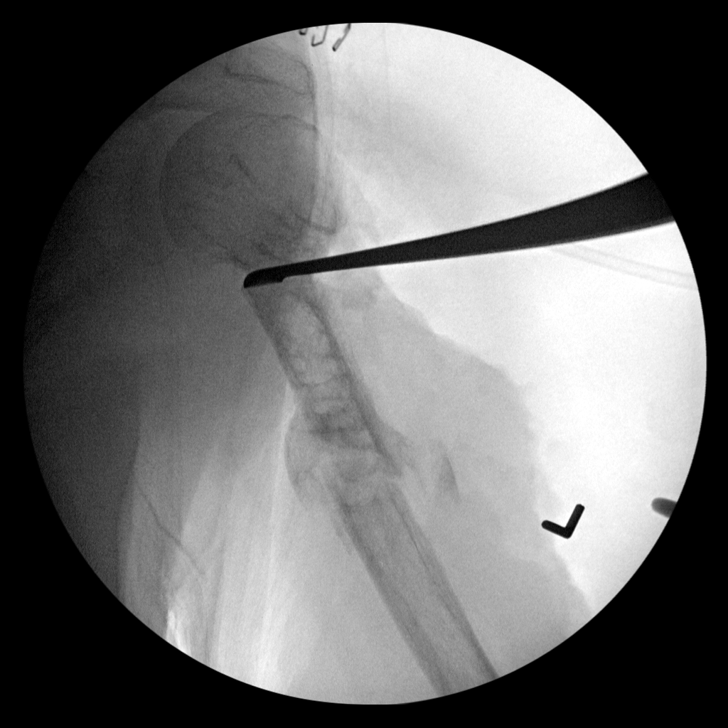
[im 3/8]
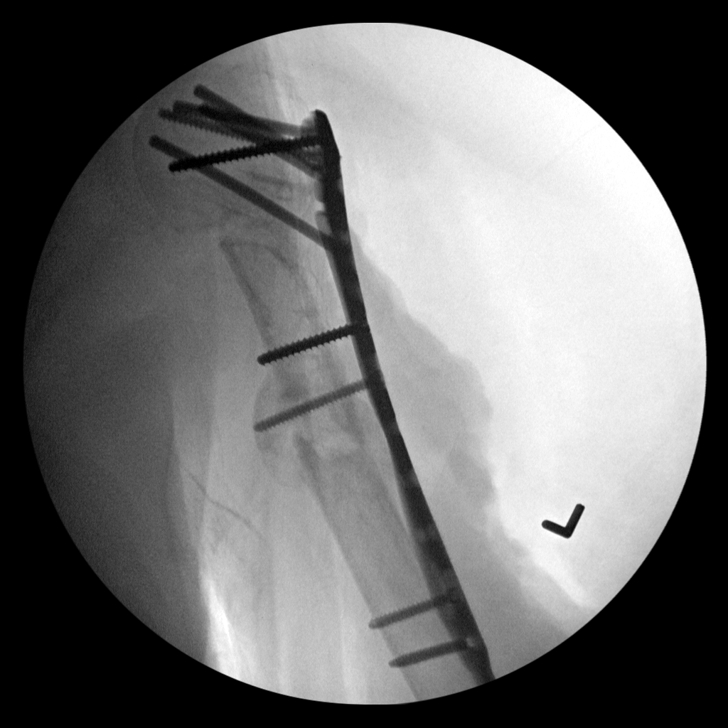
[im 4/8]
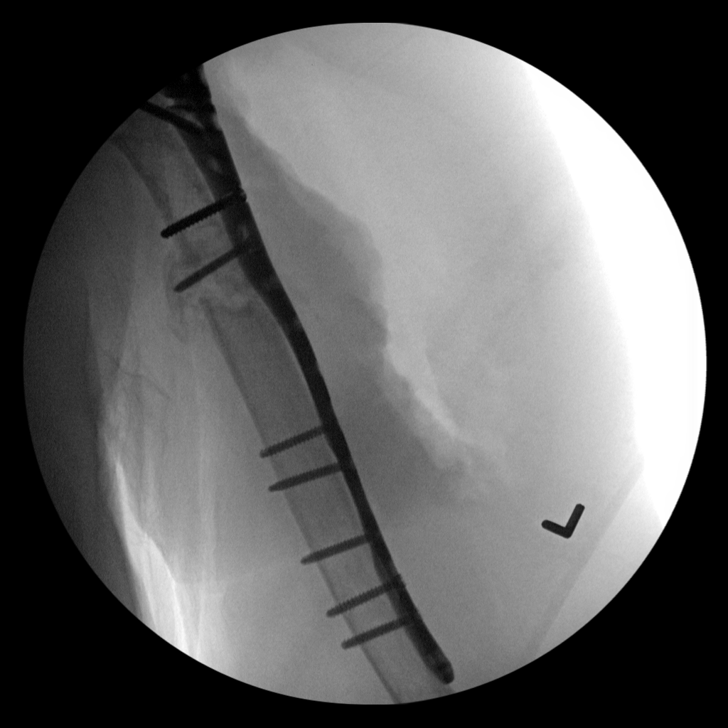
[im 5/8]
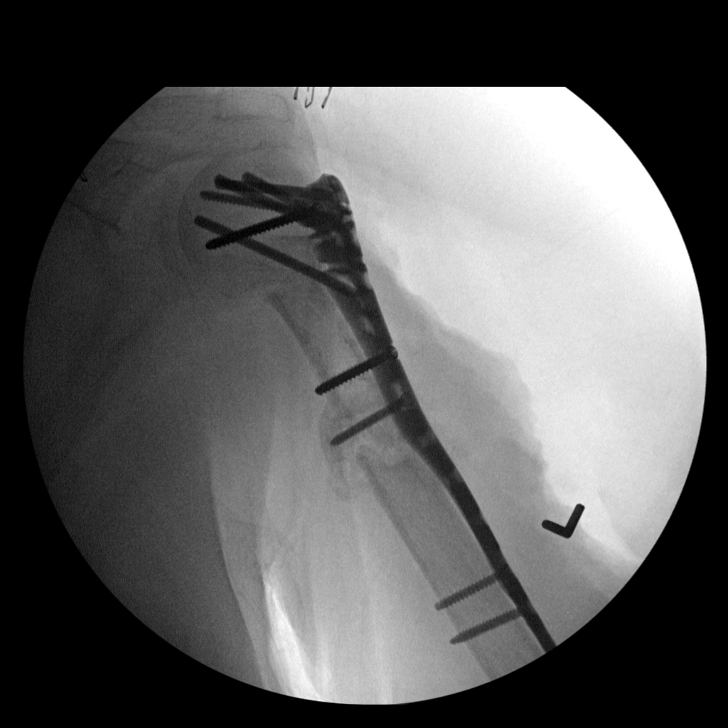
[im 6/8]
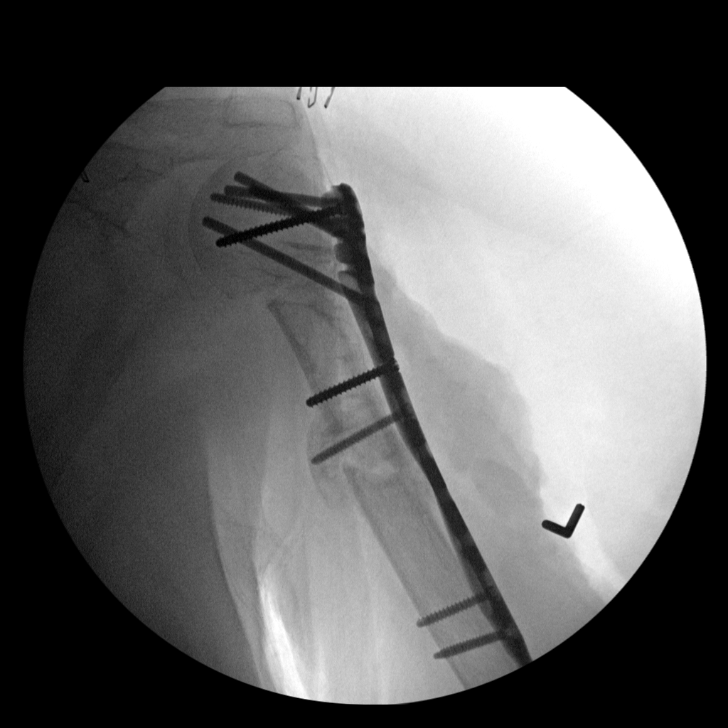
[im 7/8]
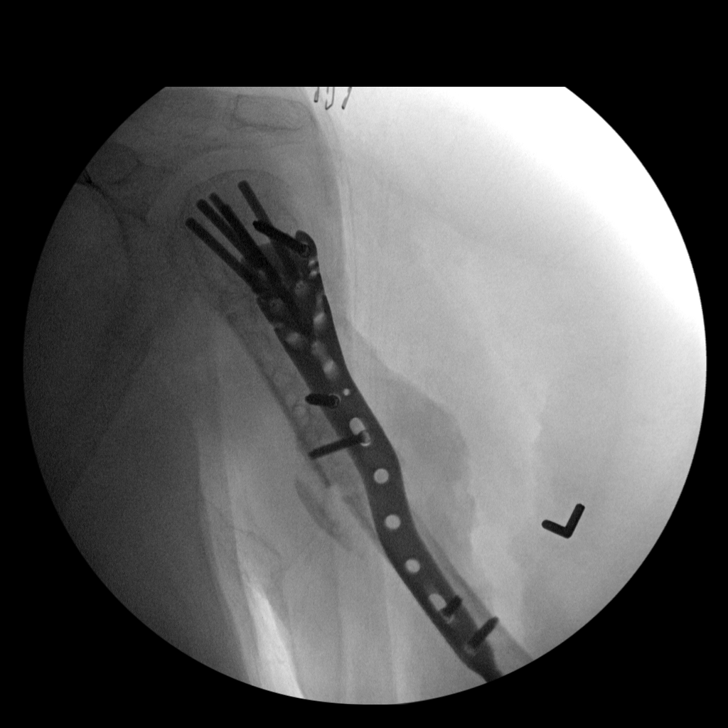
[im 8/8]
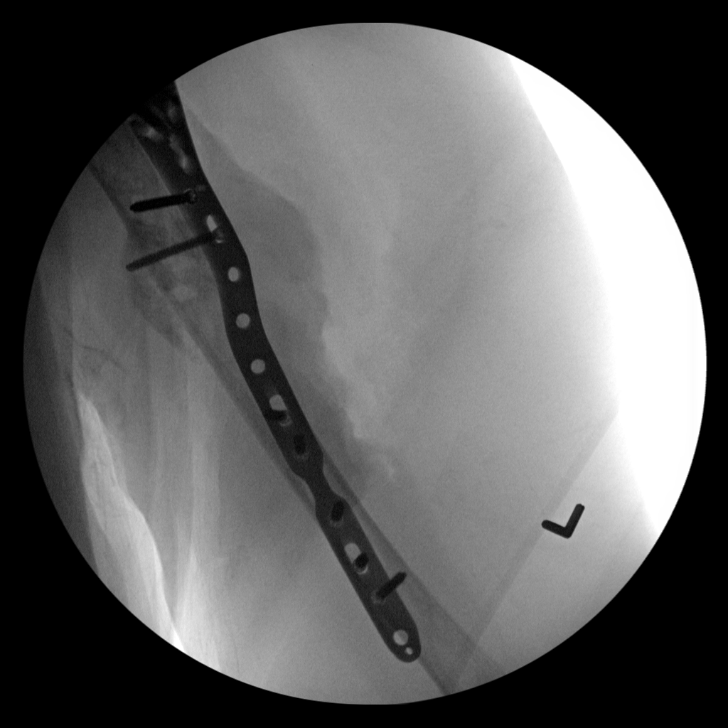

[8 of 8 positions shown; findings below may reference images not displayed]

FINDINGS: Radiation safety time-out was performed. A total of 39 seconds of
fluoroscopic time was utilized during hardware removal with
subsequent open reduction and internal fixation of comminuted
proximal ununited left humerus fractures. Subsequent placement of
new plate and screw device along the left humeral shaft spanning the
proximal humerus fractures at the humeral neck and proximal
diaphysis.
IMPRESSION: Status post hardware removal followed by ORIF with lateral plate and
screw fixation across proximal left humeral shaft fractures
involving the surgical neck and proximal diaphysis. Alignment is
near anatomic. No hardware failure is apparent.

## 2018-08-21 ENCOUNTER — Encounter: Payer: Self-pay | Admitting: Emergency Medicine

## 2018-08-21 ENCOUNTER — Emergency Department: Payer: Medicare Other

## 2018-08-21 ENCOUNTER — Emergency Department
Admission: EM | Admit: 2018-08-21 | Discharge: 2018-08-21 | Disposition: A | Discharge status: Home / Self Care | Payer: Medicare Other | Attending: Emergency Medicine | Admitting: Emergency Medicine

## 2018-08-21 DIAGNOSIS — R062 Wheezing: Secondary | ICD-10-CM

## 2018-08-21 DIAGNOSIS — J441 Chronic obstructive pulmonary disease with (acute) exacerbation: Secondary | ICD-10-CM | POA: Insufficient documentation

## 2018-08-21 DIAGNOSIS — E039 Hypothyroidism, unspecified: Secondary | ICD-10-CM | POA: Insufficient documentation

## 2018-08-21 DIAGNOSIS — Z91040 Latex allergy status: Secondary | ICD-10-CM | POA: Insufficient documentation

## 2018-08-21 DIAGNOSIS — Z87891 Personal history of nicotine dependence: Secondary | ICD-10-CM | POA: Insufficient documentation

## 2018-08-21 DIAGNOSIS — R0602 Shortness of breath: Secondary | ICD-10-CM

## 2018-08-21 DIAGNOSIS — N289 Disorder of kidney and ureter, unspecified: Secondary | ICD-10-CM | POA: Diagnosis not present

## 2018-08-21 DIAGNOSIS — J40 Bronchitis, not specified as acute or chronic: Secondary | ICD-10-CM | POA: Insufficient documentation

## 2018-08-21 DIAGNOSIS — I2510 Atherosclerotic heart disease of native coronary artery without angina pectoris: Secondary | ICD-10-CM | POA: Insufficient documentation

## 2018-08-21 DIAGNOSIS — I10 Essential (primary) hypertension: Secondary | ICD-10-CM | POA: Insufficient documentation

## 2018-08-21 DIAGNOSIS — Z96651 Presence of right artificial knee joint: Secondary | ICD-10-CM | POA: Insufficient documentation

## 2018-08-21 LAB — BASIC METABOLIC PANEL
ANION GAP: 6 (ref 5–15)
BUN: 30 mg/dL — ABNORMAL HIGH (ref 8–23)
CO2: 28 mmol/L (ref 22–32)
Calcium: 9.1 mg/dL (ref 8.9–10.3)
Chloride: 101 mmol/L (ref 98–111)
Creatinine, Ser: 1.59 mg/dL — ABNORMAL HIGH (ref 0.44–1.00)
GFR calc Af Amer: 38 mL/min — ABNORMAL LOW (ref >60)
GFR calc non Af Amer: 33 mL/min — ABNORMAL LOW (ref >60)
Glucose, Bld: 101 mg/dL — ABNORMAL HIGH (ref 70–99)
Potassium: 5 mmol/L (ref 3.5–5.1)
Sodium: 135 mmol/L (ref 135–145)

## 2018-08-21 LAB — INFLUENZA PANEL BY PCR (TYPE A & B)
INFLAPCR: NEGATIVE
INFLBPCR: NEGATIVE

## 2018-08-21 LAB — CBC
HCT: 36.2 % (ref 36.0–46.0)
HEMOGLOBIN: 11.1 g/dL — AB (ref 12.0–15.0)
MCH: 27.4 pg (ref 26.0–34.0)
MCHC: 30.7 g/dL (ref 30.0–36.0)
MCV: 89.4 fL (ref 80.0–100.0)
Platelets: 360 10*3/uL (ref 150–400)
RBC: 4.05 MIL/uL (ref 3.87–5.11)
RDW: 14.7 % (ref 11.5–15.5)
WBC: 8.7 10*3/uL (ref 4.0–10.5)
nRBC: 0 % (ref 0.0–0.2)

## 2018-08-21 LAB — TROPONIN I: Troponin I: <0.03 ng/mL (ref <0.03)

## 2018-08-21 LAB — BRAIN NATRIURETIC PEPTIDE: B Natriuretic Peptide: 102 pg/mL — ABNORMAL HIGH (ref 0.0–100.0)

## 2018-08-21 MED ORDER — ALBUTEROL SULFATE (2.5 MG/3ML) 0.083% IN NEBU
10.0000 mg/h | INHALATION_SOLUTION | Freq: Once | RESPIRATORY_TRACT | Status: AC
  Administered 2018-08-21: 10 mg/h via RESPIRATORY_TRACT
  Filled 2018-08-21: qty 3
  Filled 2018-08-21: qty 12

## 2018-08-21 MED ORDER — METHYLPREDNISOLONE SODIUM SUCC 125 MG IJ SOLR
125.0000 mg | Freq: Once | INTRAMUSCULAR | Status: AC
  Administered 2018-08-21: 125 mg via INTRAVENOUS
  Filled 2018-08-21: qty 2

## 2018-08-21 MED ORDER — ONDANSETRON HCL 4 MG/2ML IJ SOLN
4.0000 mg | Freq: Once | INTRAMUSCULAR | Status: AC
  Administered 2018-08-21: 4 mg via INTRAVENOUS
  Filled 2018-08-21: qty 2

## 2018-08-21 MED ORDER — IPRATROPIUM-ALBUTEROL 0.5-2.5 (3) MG/3ML IN SOLN
3.0000 mL | Freq: Once | RESPIRATORY_TRACT | Status: AC
  Administered 2018-08-21: 3 mL via RESPIRATORY_TRACT

## 2018-08-21 MED ORDER — BENZONATATE 100 MG PO CAPS
200.0000 mg | ORAL_CAPSULE | Freq: Once | ORAL | Status: AC
  Administered 2018-08-21: 200 mg via ORAL
  Filled 2018-08-21: qty 2

## 2018-08-21 MED ORDER — PREDNISONE 20 MG PO TABS: 60.0000 mg | ORAL_TABLET | Freq: Every day | ORAL | 0 refills | Status: AC

## 2018-08-21 MED ORDER — SODIUM CHLORIDE 0.9 % IV BOLUS: 1000.0000 mL | Freq: Once | INTRAVENOUS | Status: DC
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
## 2018-08-21 NOTE — Discharge Instructions (Addendum)
Continue to use your DuoNeb treatments as needed every 4 hours.  Please take the entire course of steroids, even if you are feeling better.  Please make a follow-up appoint with your primary care physician on Monday for reevaluation.  Return to the emergency department if you develop severe pain, shortness of breath, lightheadedness or fainting, fever, or any other symptoms concerning to you.

## 2018-08-21 NOTE — ED Triage Notes (Signed)
Pt reports has COPD and has been increasingly SOB. Pt with wet cough in triage.

## 2021-02-27 ENCOUNTER — Ambulatory Visit: Payer: Medicare Other | Admitting: Gastroenterology

## 2021-04-17 ENCOUNTER — Encounter: Admission: RE | Payer: Self-pay | Source: Ambulatory Visit

## 2021-04-17 ENCOUNTER — Ambulatory Visit: Admission: RE | Admit: 2021-04-17 | Payer: Medicare PPO | Source: Ambulatory Visit

## 2021-04-17 SURGERY — EGD (ESOPHAGOGASTRODUODENOSCOPY)
Anesthesia: General

## 2022-08-12 ENCOUNTER — Other Ambulatory Visit: Payer: Self-pay | Admitting: Internal Medicine

## 2022-08-12 DIAGNOSIS — Z1231 Encounter for screening mammogram for malignant neoplasm of breast: Secondary | ICD-10-CM

## 2022-09-03 ENCOUNTER — Ambulatory Visit
Admission: RE | Admit: 2022-09-03 | Discharge: 2022-09-03 | Disposition: A | Discharge status: Home / Self Care | Payer: Medicare PPO | Source: Ambulatory Visit | Attending: Internal Medicine | Admitting: Internal Medicine

## 2022-09-03 DIAGNOSIS — Z1231 Encounter for screening mammogram for malignant neoplasm of breast: Secondary | ICD-10-CM

## 2022-10-02 LAB — EXTERNAL GENERIC LAB PROCEDURE: COLOGUARD: NEGATIVE

## 2022-10-02 LAB — COLOGUARD
COLOGUARD: NEGATIVE
COLOGUARD: NEGATIVE

## 2023-09-16 ENCOUNTER — Other Ambulatory Visit: Payer: Self-pay | Admitting: Internal Medicine

## 2023-09-16 DIAGNOSIS — R413 Other amnesia: Secondary | ICD-10-CM

## 2023-09-16 DIAGNOSIS — I10 Essential (primary) hypertension: Secondary | ICD-10-CM

## 2023-09-25 ENCOUNTER — Ambulatory Visit

## 2024-05-03 DIAGNOSIS — J301 Allergic rhinitis due to pollen: Secondary | ICD-10-CM | POA: Diagnosis not present

## 2024-05-03 DIAGNOSIS — J849 Interstitial pulmonary disease, unspecified: Secondary | ICD-10-CM | POA: Diagnosis not present

## 2024-05-03 DIAGNOSIS — Z23 Encounter for immunization: Secondary | ICD-10-CM | POA: Diagnosis not present

## 2024-05-05 ENCOUNTER — Other Ambulatory Visit: Payer: Self-pay | Admitting: Pulmonary Disease

## 2024-05-05 DIAGNOSIS — J849 Interstitial pulmonary disease, unspecified: Secondary | ICD-10-CM

## 2024-05-19 ENCOUNTER — Ambulatory Visit: Admission: RE | Admit: 2024-05-19 | Source: Ambulatory Visit

## 2024-05-21 ENCOUNTER — Ambulatory Visit: Admission: RE | Admit: 2024-05-21 | Source: Ambulatory Visit

## 2024-05-26 ENCOUNTER — Ambulatory Visit: Admission: RE | Admit: 2024-05-26 | Source: Ambulatory Visit

## 2024-06-01 ENCOUNTER — Ambulatory Visit
Admission: RE | Admit: 2024-06-01 | Discharge: 2024-06-01 | Disposition: A | Discharge status: Home / Self Care | Source: Ambulatory Visit | Attending: Pulmonary Disease | Admitting: Pulmonary Disease

## 2024-06-01 DIAGNOSIS — J849 Interstitial pulmonary disease, unspecified: Secondary | ICD-10-CM | POA: Insufficient documentation

## 2024-06-01 DIAGNOSIS — R0689 Other abnormalities of breathing: Secondary | ICD-10-CM | POA: Diagnosis not present

## 2024-06-18 ENCOUNTER — Other Ambulatory Visit: Payer: Self-pay | Admitting: Internal Medicine

## 2024-06-18 DIAGNOSIS — R413 Other amnesia: Secondary | ICD-10-CM

## 2024-06-21 ENCOUNTER — Other Ambulatory Visit: Payer: Self-pay | Admitting: Internal Medicine

## 2024-06-21 DIAGNOSIS — N281 Cyst of kidney, acquired: Secondary | ICD-10-CM

## 2024-06-22 ENCOUNTER — Ambulatory Visit: Admission: RE | Admit: 2024-06-22 | Discharge: 2024-06-22 | Attending: Internal Medicine | Admitting: Internal Medicine

## 2024-06-22 DIAGNOSIS — R413 Other amnesia: Secondary | ICD-10-CM | POA: Diagnosis not present

## 2024-06-29 ENCOUNTER — Ambulatory Visit

## 2024-06-30 ENCOUNTER — Ambulatory Visit

## 2024-07-19 ENCOUNTER — Ambulatory Visit

## 2024-07-23 ENCOUNTER — Ambulatory Visit
